# Patient Record
Sex: Male | Born: 1945
Health system: Southern US, Community
[De-identification: ages and names within clinical notes are randomized; demographics above are authoritative.]

## PROBLEM LIST (undated history)

## (undated) DIAGNOSIS — B029 Zoster without complications: Secondary | ICD-10-CM

## (undated) DIAGNOSIS — H9319 Tinnitus, unspecified ear: Secondary | ICD-10-CM

## (undated) DIAGNOSIS — R079 Chest pain, unspecified: Secondary | ICD-10-CM

## (undated) DIAGNOSIS — I209 Angina pectoris, unspecified: Secondary | ICD-10-CM

## (undated) DIAGNOSIS — H269 Unspecified cataract: Secondary | ICD-10-CM

## (undated) DIAGNOSIS — I10 Essential (primary) hypertension: Secondary | ICD-10-CM

## (undated) DIAGNOSIS — E785 Hyperlipidemia, unspecified: Secondary | ICD-10-CM

## (undated) HISTORY — DX: Essential (primary) hypertension: I10

## (undated) HISTORY — PX: COLONOSCOPY: SHX174

## (undated) HISTORY — DX: Tinnitus, unspecified ear: H93.19

## (undated) HISTORY — DX: Unspecified cataract: H26.9

## (undated) HISTORY — DX: Hyperlipidemia, unspecified: E78.5

## (undated) HISTORY — DX: Angina pectoris, unspecified: I20.9

## (undated) HISTORY — PX: ROTATOR CUFF REPAIR: SHX139

## (undated) HISTORY — DX: Chest pain, unspecified: R07.9

## (undated) HISTORY — DX: Zoster without complications: B02.9

## (undated) HISTORY — PX: OTHER SURGICAL HISTORY: SHX169

---

## 2010-07-03 DIAGNOSIS — B029 Zoster without complications: Secondary | ICD-10-CM

## 2010-07-03 HISTORY — DX: Zoster without complications: B02.9

## 2012-03-21 ENCOUNTER — Other Ambulatory Visit: Payer: Self-pay | Admitting: Family Medicine

## 2012-03-21 DIAGNOSIS — R52 Pain, unspecified: Secondary | ICD-10-CM

## 2012-03-29 ENCOUNTER — Other Ambulatory Visit: Payer: Self-pay

## 2013-02-17 DIAGNOSIS — Z125 Encounter for screening for malignant neoplasm of prostate: Secondary | ICD-10-CM | POA: Diagnosis not present

## 2013-02-17 DIAGNOSIS — I10 Essential (primary) hypertension: Secondary | ICD-10-CM | POA: Diagnosis not present

## 2013-02-24 ENCOUNTER — Other Ambulatory Visit: Payer: Self-pay | Admitting: Internal Medicine

## 2013-02-24 DIAGNOSIS — F17200 Nicotine dependence, unspecified, uncomplicated: Secondary | ICD-10-CM

## 2013-02-24 DIAGNOSIS — E785 Hyperlipidemia, unspecified: Secondary | ICD-10-CM | POA: Diagnosis not present

## 2013-02-24 DIAGNOSIS — B356 Tinea cruris: Secondary | ICD-10-CM | POA: Diagnosis not present

## 2013-02-24 DIAGNOSIS — Z23 Encounter for immunization: Secondary | ICD-10-CM | POA: Diagnosis not present

## 2013-02-24 DIAGNOSIS — Z0000 Encounter for general adult medical examination without abnormal findings: Secondary | ICD-10-CM | POA: Diagnosis not present

## 2013-02-24 DIAGNOSIS — I10 Essential (primary) hypertension: Secondary | ICD-10-CM | POA: Diagnosis not present

## 2013-02-24 DIAGNOSIS — Z1331 Encounter for screening for depression: Secondary | ICD-10-CM | POA: Diagnosis not present

## 2013-02-24 DIAGNOSIS — R42 Dizziness and giddiness: Secondary | ICD-10-CM | POA: Diagnosis not present

## 2013-02-24 DIAGNOSIS — Z125 Encounter for screening for malignant neoplasm of prostate: Secondary | ICD-10-CM | POA: Diagnosis not present

## 2013-02-28 DIAGNOSIS — Z1212 Encounter for screening for malignant neoplasm of rectum: Secondary | ICD-10-CM | POA: Diagnosis not present

## 2013-03-06 ENCOUNTER — Ambulatory Visit
Admission: RE | Admit: 2013-03-06 | Discharge: 2013-03-06 | Disposition: A | Discharge status: Home / Self Care | Payer: Medicare Other | Source: Ambulatory Visit | Attending: Internal Medicine | Admitting: Internal Medicine

## 2013-03-06 DIAGNOSIS — F17200 Nicotine dependence, unspecified, uncomplicated: Secondary | ICD-10-CM

## 2013-03-06 DIAGNOSIS — Z136 Encounter for screening for cardiovascular disorders: Secondary | ICD-10-CM | POA: Diagnosis not present

## 2013-03-06 IMAGING — US US AORTA SCREENING (MEDICARE)
1 series · 14 of 17 positions shown · non-contrast
Comparison: None.

CLINICAL DATA: Abdominal aortic aneurysm.

ABDOMINAL AORTA SCREENING ULTRASOUND
TECHNIQUE: Ultrasound examination of the abdominal aorta was
performed as a screening evaluation for abdominal aortic aneurysm.

[Series 1: us aorta screening (medicare) · 0.30mm/px · 14 of 17 slices shown]
[im 1/17]
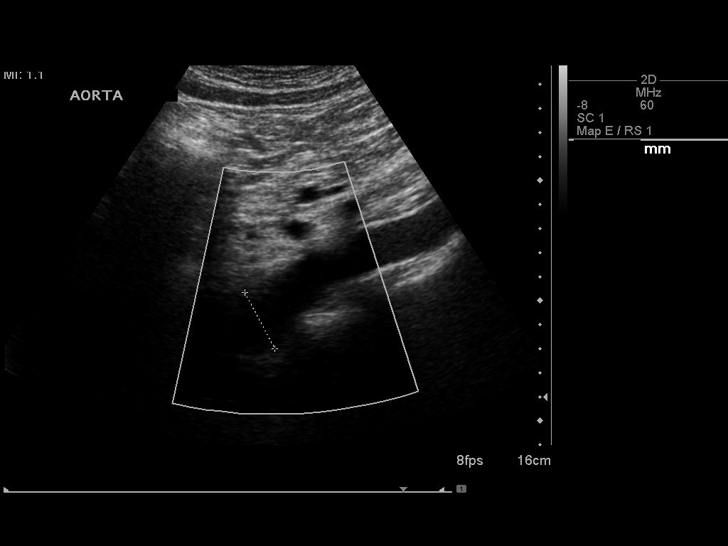
[im 2/17]
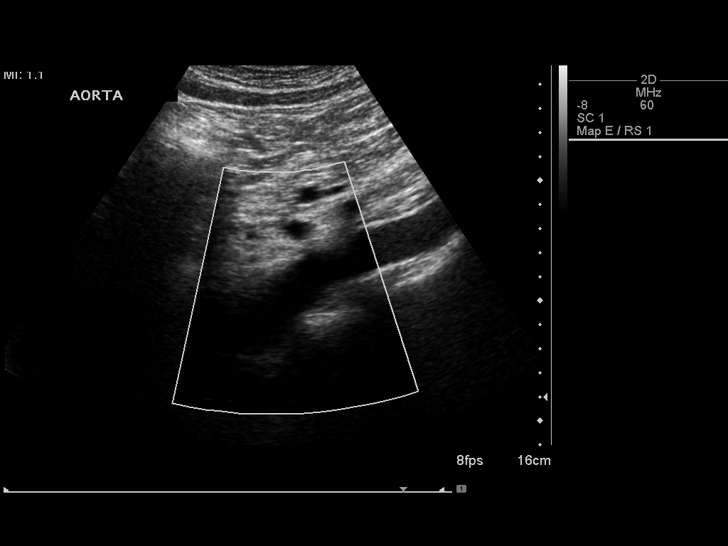
[im 4/17]
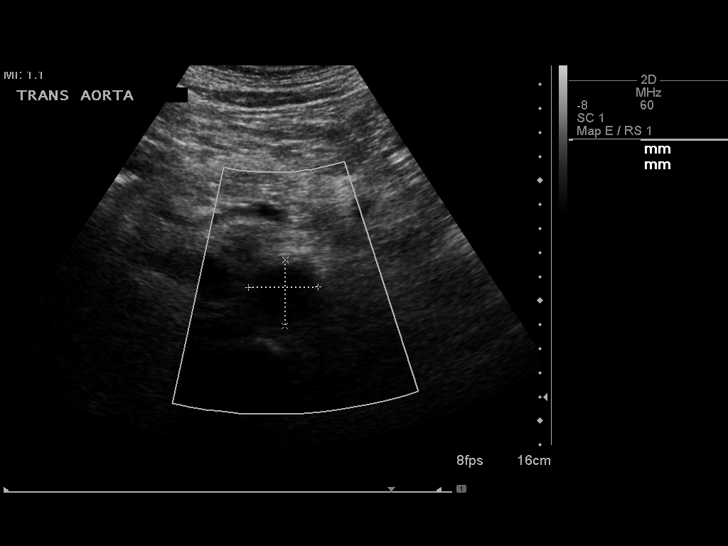
[im 5/17]
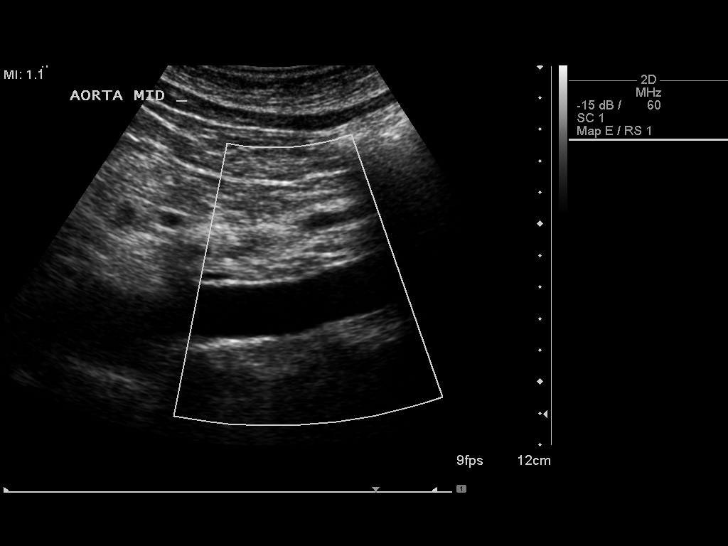
[im 6/17]
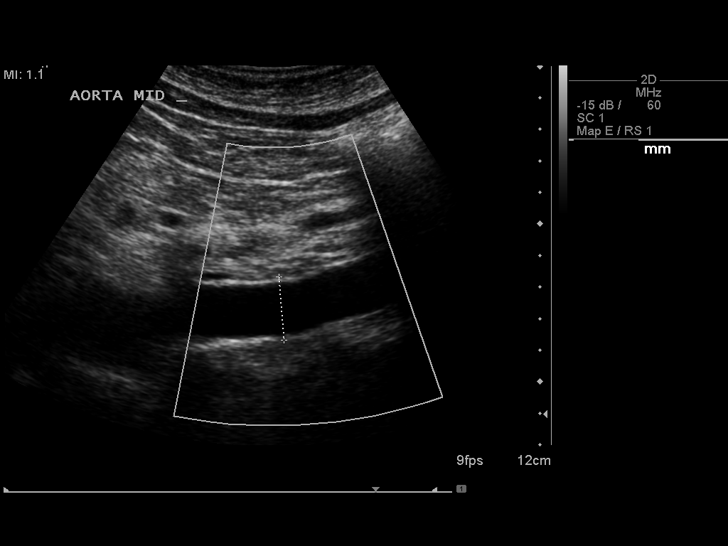
[im 7/17]
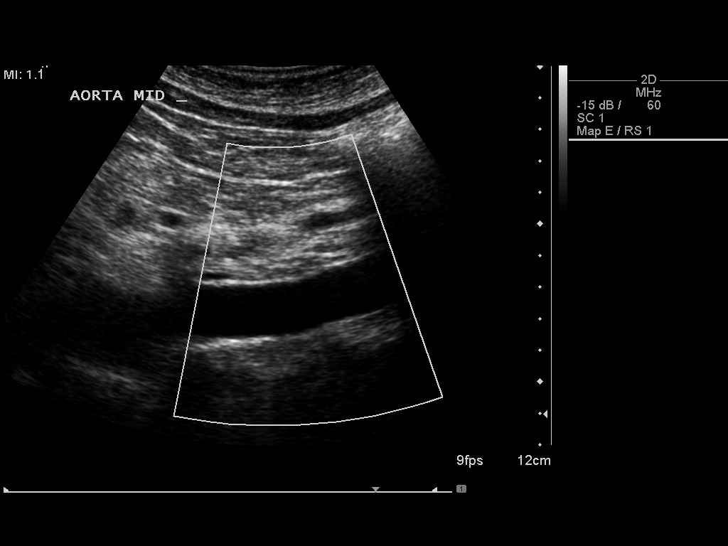
[im 8/17]
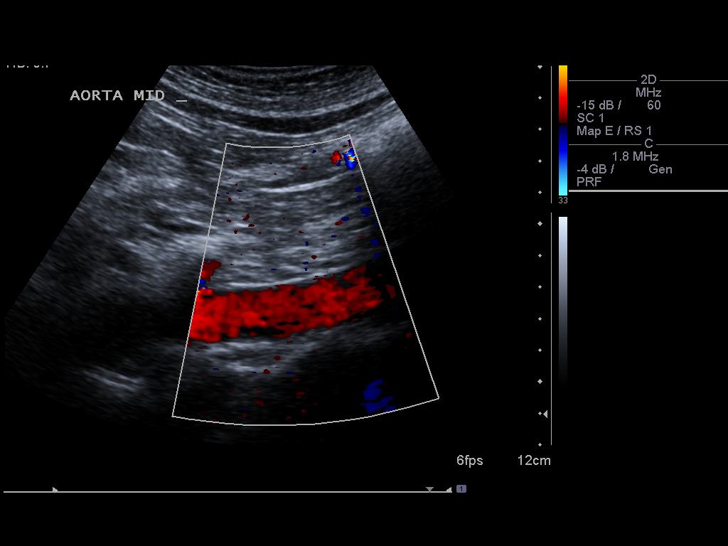
[im 10/17]
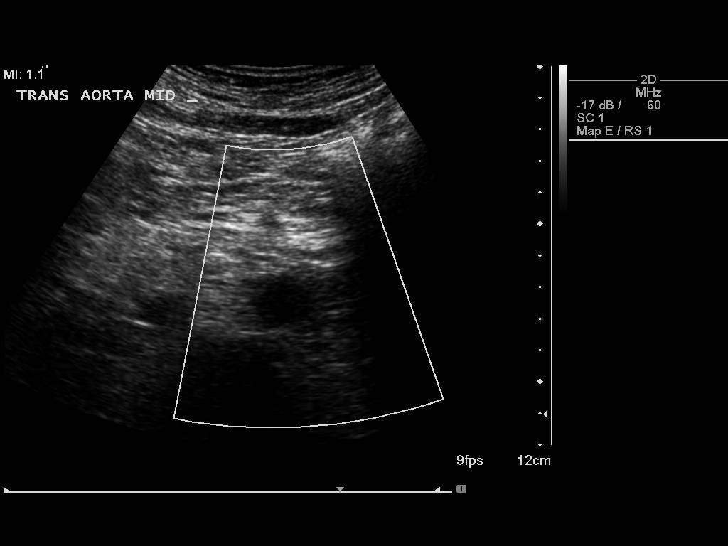
[im 11/17]
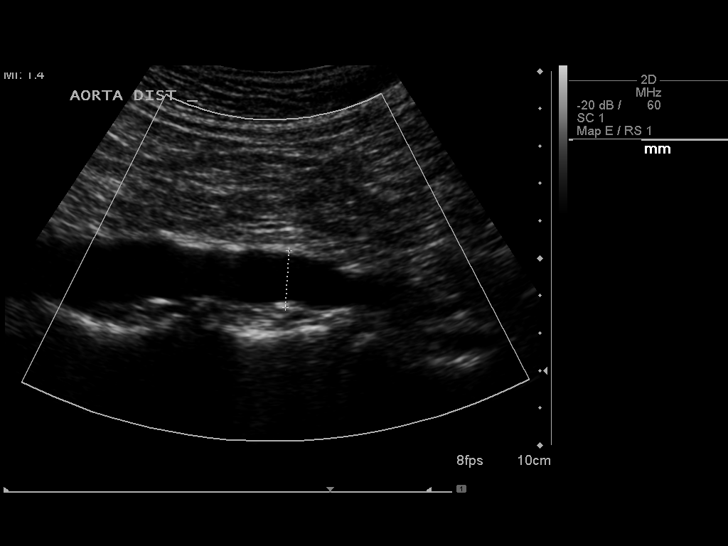
[im 12/17]
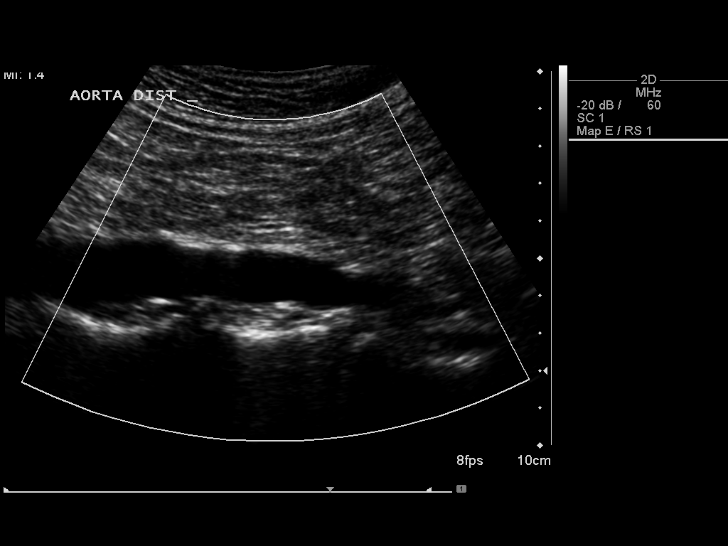
[im 13/17]
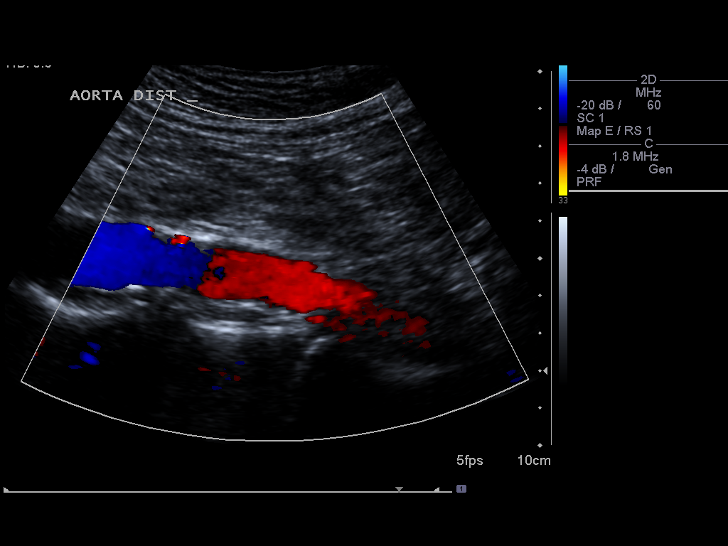
[im 14/17]
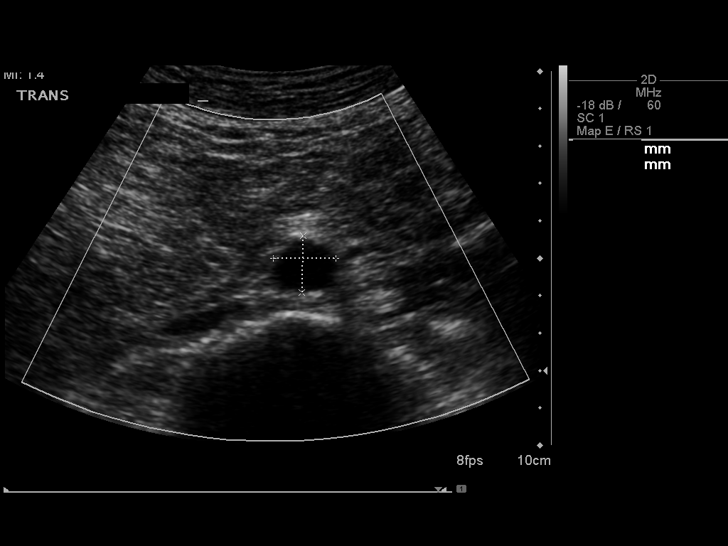
[im 16/17]
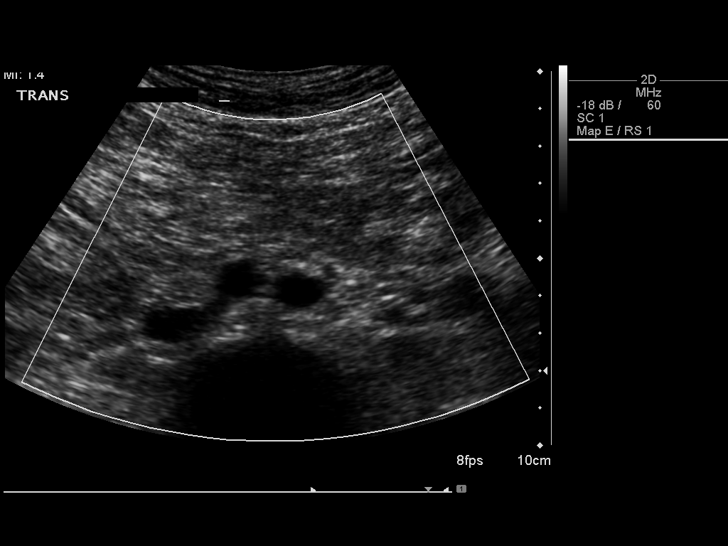
[im 17/17]
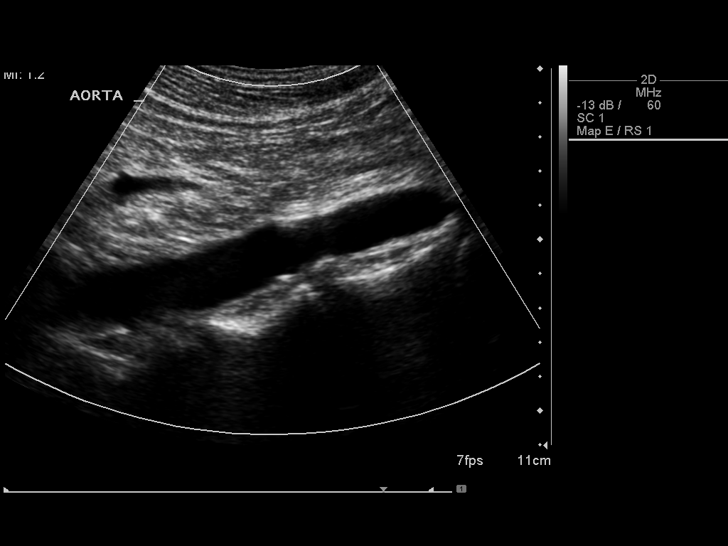

[14 of 17 positions shown; findings below may reference images not displayed]

Abdominal Aorta: No aneurysm identified.

      Maximum AP diameter:  2.7 cm.
      Maximum TRV diameter:  2.9 cm.
IMPRESSION: No abdominal aortic aneurysm.  Mild atherosclerosis.

## 2014-02-25 DIAGNOSIS — E785 Hyperlipidemia, unspecified: Secondary | ICD-10-CM | POA: Diagnosis not present

## 2014-02-25 DIAGNOSIS — I10 Essential (primary) hypertension: Secondary | ICD-10-CM | POA: Diagnosis not present

## 2014-02-25 DIAGNOSIS — Z125 Encounter for screening for malignant neoplasm of prostate: Secondary | ICD-10-CM | POA: Diagnosis not present

## 2014-03-04 DIAGNOSIS — N402 Nodular prostate without lower urinary tract symptoms: Secondary | ICD-10-CM | POA: Diagnosis not present

## 2014-03-04 DIAGNOSIS — A6000 Herpesviral infection of urogenital system, unspecified: Secondary | ICD-10-CM | POA: Diagnosis not present

## 2014-03-04 DIAGNOSIS — R42 Dizziness and giddiness: Secondary | ICD-10-CM | POA: Diagnosis not present

## 2014-03-04 DIAGNOSIS — N529 Male erectile dysfunction, unspecified: Secondary | ICD-10-CM | POA: Diagnosis not present

## 2014-03-04 DIAGNOSIS — E785 Hyperlipidemia, unspecified: Secondary | ICD-10-CM | POA: Diagnosis not present

## 2014-03-04 DIAGNOSIS — Z1331 Encounter for screening for depression: Secondary | ICD-10-CM | POA: Diagnosis not present

## 2014-03-04 DIAGNOSIS — I10 Essential (primary) hypertension: Secondary | ICD-10-CM | POA: Diagnosis not present

## 2014-03-04 DIAGNOSIS — Z0000 Encounter for general adult medical examination without abnormal findings: Secondary | ICD-10-CM | POA: Diagnosis not present

## 2014-03-04 DIAGNOSIS — Z1212 Encounter for screening for malignant neoplasm of rectum: Secondary | ICD-10-CM | POA: Diagnosis not present

## 2014-04-29 DIAGNOSIS — N402 Nodular prostate without lower urinary tract symptoms: Secondary | ICD-10-CM | POA: Diagnosis not present

## 2014-12-18 DIAGNOSIS — N402 Nodular prostate without lower urinary tract symptoms: Secondary | ICD-10-CM | POA: Diagnosis not present

## 2014-12-23 DIAGNOSIS — N402 Nodular prostate without lower urinary tract symptoms: Secondary | ICD-10-CM | POA: Diagnosis not present

## 2015-01-14 DIAGNOSIS — N402 Nodular prostate without lower urinary tract symptoms: Secondary | ICD-10-CM | POA: Diagnosis not present

## 2015-03-09 DIAGNOSIS — Z125 Encounter for screening for malignant neoplasm of prostate: Secondary | ICD-10-CM | POA: Diagnosis not present

## 2015-03-09 DIAGNOSIS — I10 Essential (primary) hypertension: Secondary | ICD-10-CM | POA: Diagnosis not present

## 2015-03-09 DIAGNOSIS — E785 Hyperlipidemia, unspecified: Secondary | ICD-10-CM | POA: Diagnosis not present

## 2015-03-15 DIAGNOSIS — Z0000 Encounter for general adult medical examination without abnormal findings: Secondary | ICD-10-CM | POA: Diagnosis not present

## 2015-03-15 DIAGNOSIS — Z6823 Body mass index (BMI) 23.0-23.9, adult: Secondary | ICD-10-CM | POA: Diagnosis not present

## 2015-03-15 DIAGNOSIS — Z1389 Encounter for screening for other disorder: Secondary | ICD-10-CM | POA: Diagnosis not present

## 2015-03-15 DIAGNOSIS — E785 Hyperlipidemia, unspecified: Secondary | ICD-10-CM | POA: Diagnosis not present

## 2015-03-15 DIAGNOSIS — Z23 Encounter for immunization: Secondary | ICD-10-CM | POA: Diagnosis not present

## 2015-03-15 DIAGNOSIS — R42 Dizziness and giddiness: Secondary | ICD-10-CM | POA: Diagnosis not present

## 2015-03-15 DIAGNOSIS — N402 Nodular prostate without lower urinary tract symptoms: Secondary | ICD-10-CM | POA: Diagnosis not present

## 2015-03-15 DIAGNOSIS — K5909 Other constipation: Secondary | ICD-10-CM | POA: Diagnosis not present

## 2015-03-15 DIAGNOSIS — M25551 Pain in right hip: Secondary | ICD-10-CM | POA: Diagnosis not present

## 2015-03-15 DIAGNOSIS — R7301 Impaired fasting glucose: Secondary | ICD-10-CM | POA: Diagnosis not present

## 2015-03-15 DIAGNOSIS — A6000 Herpesviral infection of urogenital system, unspecified: Secondary | ICD-10-CM | POA: Diagnosis not present

## 2015-03-15 DIAGNOSIS — I10 Essential (primary) hypertension: Secondary | ICD-10-CM | POA: Diagnosis not present

## 2015-03-15 DIAGNOSIS — N529 Male erectile dysfunction, unspecified: Secondary | ICD-10-CM | POA: Diagnosis not present

## 2015-04-26 DIAGNOSIS — Z1212 Encounter for screening for malignant neoplasm of rectum: Secondary | ICD-10-CM | POA: Diagnosis not present

## 2015-07-14 DIAGNOSIS — N402 Nodular prostate without lower urinary tract symptoms: Secondary | ICD-10-CM | POA: Diagnosis not present

## 2015-07-21 DIAGNOSIS — N402 Nodular prostate without lower urinary tract symptoms: Secondary | ICD-10-CM | POA: Diagnosis not present

## 2015-07-21 DIAGNOSIS — Z0000 Encounter for general adult medical examination without abnormal findings: Secondary | ICD-10-CM | POA: Diagnosis not present

## 2015-09-13 DIAGNOSIS — M25551 Pain in right hip: Secondary | ICD-10-CM | POA: Diagnosis not present

## 2015-09-13 DIAGNOSIS — Z6823 Body mass index (BMI) 23.0-23.9, adult: Secondary | ICD-10-CM | POA: Diagnosis not present

## 2015-09-24 DIAGNOSIS — M25551 Pain in right hip: Secondary | ICD-10-CM | POA: Diagnosis not present

## 2015-11-08 DIAGNOSIS — M25551 Pain in right hip: Secondary | ICD-10-CM | POA: Diagnosis not present

## 2016-01-26 DIAGNOSIS — N402 Nodular prostate without lower urinary tract symptoms: Secondary | ICD-10-CM | POA: Diagnosis not present

## 2016-02-02 DIAGNOSIS — N402 Nodular prostate without lower urinary tract symptoms: Secondary | ICD-10-CM | POA: Diagnosis not present

## 2016-03-15 DIAGNOSIS — Z0000 Encounter for general adult medical examination without abnormal findings: Secondary | ICD-10-CM | POA: Diagnosis not present

## 2016-03-15 DIAGNOSIS — R7301 Impaired fasting glucose: Secondary | ICD-10-CM | POA: Diagnosis not present

## 2016-03-15 DIAGNOSIS — Z125 Encounter for screening for malignant neoplasm of prostate: Secondary | ICD-10-CM | POA: Diagnosis not present

## 2016-03-15 DIAGNOSIS — I10 Essential (primary) hypertension: Secondary | ICD-10-CM | POA: Diagnosis not present

## 2016-03-15 DIAGNOSIS — E784 Other hyperlipidemia: Secondary | ICD-10-CM | POA: Diagnosis not present

## 2016-03-22 DIAGNOSIS — A6000 Herpesviral infection of urogenital system, unspecified: Secondary | ICD-10-CM | POA: Diagnosis not present

## 2016-03-22 DIAGNOSIS — R42 Dizziness and giddiness: Secondary | ICD-10-CM | POA: Diagnosis not present

## 2016-03-22 DIAGNOSIS — Z1389 Encounter for screening for other disorder: Secondary | ICD-10-CM | POA: Diagnosis not present

## 2016-03-22 DIAGNOSIS — I10 Essential (primary) hypertension: Secondary | ICD-10-CM | POA: Diagnosis not present

## 2016-03-22 DIAGNOSIS — N528 Other male erectile dysfunction: Secondary | ICD-10-CM | POA: Diagnosis not present

## 2016-03-22 DIAGNOSIS — K5909 Other constipation: Secondary | ICD-10-CM | POA: Diagnosis not present

## 2016-03-22 DIAGNOSIS — R7301 Impaired fasting glucose: Secondary | ICD-10-CM | POA: Diagnosis not present

## 2016-03-22 DIAGNOSIS — Z6823 Body mass index (BMI) 23.0-23.9, adult: Secondary | ICD-10-CM | POA: Diagnosis not present

## 2016-03-22 DIAGNOSIS — N402 Nodular prostate without lower urinary tract symptoms: Secondary | ICD-10-CM | POA: Diagnosis not present

## 2016-03-22 DIAGNOSIS — E784 Other hyperlipidemia: Secondary | ICD-10-CM | POA: Diagnosis not present

## 2016-03-22 DIAGNOSIS — Z0000 Encounter for general adult medical examination without abnormal findings: Secondary | ICD-10-CM | POA: Diagnosis not present

## 2016-03-27 DIAGNOSIS — Z1212 Encounter for screening for malignant neoplasm of rectum: Secondary | ICD-10-CM | POA: Diagnosis not present

## 2016-07-13 DIAGNOSIS — H2513 Age-related nuclear cataract, bilateral: Secondary | ICD-10-CM | POA: Diagnosis not present

## 2016-07-13 DIAGNOSIS — H52203 Unspecified astigmatism, bilateral: Secondary | ICD-10-CM | POA: Diagnosis not present

## 2016-07-13 DIAGNOSIS — H5203 Hypermetropia, bilateral: Secondary | ICD-10-CM | POA: Diagnosis not present

## 2016-09-20 DIAGNOSIS — Z6824 Body mass index (BMI) 24.0-24.9, adult: Secondary | ICD-10-CM | POA: Diagnosis not present

## 2016-09-20 DIAGNOSIS — C4491 Basal cell carcinoma of skin, unspecified: Secondary | ICD-10-CM | POA: Diagnosis not present

## 2016-10-05 DIAGNOSIS — L603 Nail dystrophy: Secondary | ICD-10-CM | POA: Diagnosis not present

## 2016-10-05 DIAGNOSIS — L918 Other hypertrophic disorders of the skin: Secondary | ICD-10-CM | POA: Diagnosis not present

## 2016-10-05 DIAGNOSIS — L570 Actinic keratosis: Secondary | ICD-10-CM | POA: Diagnosis not present

## 2016-10-05 DIAGNOSIS — D2272 Melanocytic nevi of left lower limb, including hip: Secondary | ICD-10-CM | POA: Diagnosis not present

## 2016-10-05 DIAGNOSIS — L821 Other seborrheic keratosis: Secondary | ICD-10-CM | POA: Diagnosis not present

## 2016-10-05 DIAGNOSIS — D2271 Melanocytic nevi of right lower limb, including hip: Secondary | ICD-10-CM | POA: Diagnosis not present

## 2016-10-05 DIAGNOSIS — D2361 Other benign neoplasm of skin of right upper limb, including shoulder: Secondary | ICD-10-CM | POA: Diagnosis not present

## 2016-10-05 DIAGNOSIS — B351 Tinea unguium: Secondary | ICD-10-CM | POA: Diagnosis not present

## 2016-10-05 DIAGNOSIS — B353 Tinea pedis: Secondary | ICD-10-CM | POA: Diagnosis not present

## 2016-10-05 DIAGNOSIS — D485 Neoplasm of uncertain behavior of skin: Secondary | ICD-10-CM | POA: Diagnosis not present

## 2016-10-05 DIAGNOSIS — D2262 Melanocytic nevi of left upper limb, including shoulder: Secondary | ICD-10-CM | POA: Diagnosis not present

## 2016-10-05 DIAGNOSIS — Z79899 Other long term (current) drug therapy: Secondary | ICD-10-CM | POA: Diagnosis not present

## 2016-10-05 DIAGNOSIS — C44629 Squamous cell carcinoma of skin of left upper limb, including shoulder: Secondary | ICD-10-CM | POA: Diagnosis not present

## 2016-11-23 DIAGNOSIS — L601 Onycholysis: Secondary | ICD-10-CM | POA: Diagnosis not present

## 2016-11-23 DIAGNOSIS — Z79899 Other long term (current) drug therapy: Secondary | ICD-10-CM | POA: Diagnosis not present

## 2017-01-31 DIAGNOSIS — N402 Nodular prostate without lower urinary tract symptoms: Secondary | ICD-10-CM | POA: Diagnosis not present

## 2017-02-07 DIAGNOSIS — N402 Nodular prostate without lower urinary tract symptoms: Secondary | ICD-10-CM | POA: Diagnosis not present

## 2017-03-07 DIAGNOSIS — Z85828 Personal history of other malignant neoplasm of skin: Secondary | ICD-10-CM | POA: Diagnosis not present

## 2017-03-07 DIAGNOSIS — B351 Tinea unguium: Secondary | ICD-10-CM | POA: Diagnosis not present

## 2017-03-20 DIAGNOSIS — I10 Essential (primary) hypertension: Secondary | ICD-10-CM | POA: Diagnosis not present

## 2017-03-20 DIAGNOSIS — Z125 Encounter for screening for malignant neoplasm of prostate: Secondary | ICD-10-CM | POA: Diagnosis not present

## 2017-03-20 DIAGNOSIS — R7301 Impaired fasting glucose: Secondary | ICD-10-CM | POA: Diagnosis not present

## 2017-03-20 DIAGNOSIS — E784 Other hyperlipidemia: Secondary | ICD-10-CM | POA: Diagnosis not present

## 2017-03-27 DIAGNOSIS — R7301 Impaired fasting glucose: Secondary | ICD-10-CM | POA: Diagnosis not present

## 2017-03-27 DIAGNOSIS — Z0000 Encounter for general adult medical examination without abnormal findings: Secondary | ICD-10-CM | POA: Diagnosis not present

## 2017-03-27 DIAGNOSIS — Z6824 Body mass index (BMI) 24.0-24.9, adult: Secondary | ICD-10-CM | POA: Diagnosis not present

## 2017-03-27 DIAGNOSIS — R251 Tremor, unspecified: Secondary | ICD-10-CM | POA: Diagnosis not present

## 2017-03-27 DIAGNOSIS — Z859 Personal history of malignant neoplasm, unspecified: Secondary | ICD-10-CM | POA: Diagnosis not present

## 2017-03-27 DIAGNOSIS — E784 Other hyperlipidemia: Secondary | ICD-10-CM | POA: Diagnosis not present

## 2017-03-27 DIAGNOSIS — R269 Unspecified abnormalities of gait and mobility: Secondary | ICD-10-CM | POA: Diagnosis not present

## 2017-03-27 DIAGNOSIS — Z1389 Encounter for screening for other disorder: Secondary | ICD-10-CM | POA: Diagnosis not present

## 2017-03-27 DIAGNOSIS — N402 Nodular prostate without lower urinary tract symptoms: Secondary | ICD-10-CM | POA: Diagnosis not present

## 2017-03-27 DIAGNOSIS — R42 Dizziness and giddiness: Secondary | ICD-10-CM | POA: Diagnosis not present

## 2017-03-27 DIAGNOSIS — I10 Essential (primary) hypertension: Secondary | ICD-10-CM | POA: Diagnosis not present

## 2017-03-28 ENCOUNTER — Encounter: Payer: Self-pay | Admitting: Neurology

## 2017-04-04 DIAGNOSIS — Z1212 Encounter for screening for malignant neoplasm of rectum: Secondary | ICD-10-CM | POA: Diagnosis not present

## 2017-04-25 ENCOUNTER — Encounter: Payer: Self-pay | Admitting: Neurology

## 2017-04-26 NOTE — Progress Notes (Signed)
Jeffery Kirby was seen today in the movement disorders clinic for neurologic consultation at the request of Marton Redwood, MD.  The consultation is for the evaluation of tremor and to r/o PD.  Pt states that he hasn't really even noticed tremor but wife mentioned it after Dr. Brigitte Pulse said something.  Specific Symptoms:  Tremor: Yes.  , he isn't sure but thinks it happens when grabbing something.   Family hx of similar:  No. Voice: no change.   Sleep: sleeps well  Vivid Dreams:  Yes.    Acting out dreams:  Yes.  , wife states that he screams and can "throw punches" Wet Pillows: No. Postural symptoms:  No. - very minimal balance change  Falls?  No. Bradykinesia symptoms: may push off to get out of a chair Loss of smell:  Yes.   Loss of taste:  No. Urinary Incontinence:  No. but has nocturia Difficulty Swallowing:  No.  Handwriting, micrographia: No. Trouble with ADL's:  No.  Trouble buttoning clothing: No. (minimal trouble with "tight buttons") Depression:  No. Memory changes:  No. Hallucinations:  No.  visual distortions: Yes.  , but only because has frequent floaters N/V:  No. Lightheaded:  Yes.    Syncope: No. Diplopia:  No. Dyskinesia:  No.  Neuroimaging of the brain has not previously been performed.    PREVIOUS MEDICATIONS: none to date  ALLERGIES:  No Known Allergies  CURRENT MEDICATIONS:  Outpatient Encounter Prescriptions as of 04/30/2017  Medication Sig  . aspirin EC 81 MG tablet Take 81 mg by mouth daily.  . naproxen (NAPROSYN) 500 MG tablet Take 500 mg by mouth 2 (two) times daily with a meal.  . simvastatin (ZOCOR) 20 MG tablet Take 20 mg by mouth daily.  . [DISCONTINUED] sildenafil (VIAGRA) 100 MG tablet Take 100 mg by mouth daily as needed for erectile dysfunction.   No facility-administered encounter medications on file as of 04/30/2017.     PAST MEDICAL HISTORY:   Past Medical History:  Diagnosis Date  . Diverticulosis   . Hyperlipidemia   .  Shingles 2012  . Tinnitus     PAST SURGICAL HISTORY:   Past Surgical History:  Procedure Laterality Date  . ROTATOR CUFF REPAIR Bilateral     SOCIAL HISTORY:   Social History   Social History  . Marital status: Married    Spouse name: N/A  . Number of children: N/A  . Years of education: N/A   Occupational History  . 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 Smoking status: Former Smoker    Start date: 07/03/1965    Quit date: 07/04/1967  . Smokeless tobacco: Never Used  . Alcohol use Yes     Comment: occasional beer  . Drug use: No  . Sexual activity: Not on file   Other Topics Concern  . Not on file   Social History Narrative  . No narrative on file    FAMILY HISTORY:   Family Status  Relation Status  . Mother Deceased  . Father Deceased  . Sister Deceased  . Brother Alive  . Sister Deceased    ROS: Complains of ringing in the ears, only noticeable when the room is quiet.  A complete 10 system review of systems was obtained and was unremarkable apart from what is mentioned above.  PHYSICAL EXAMINATION:    VITALS:   Vitals:   04/30/17 1006  BP: 110/70  Pulse:  72  SpO2: 97%  Weight: 184 lb (83.5 kg)  Height: 6\' 2"  (1.88 m)    GEN:  The patient appears stated age and is in NAD. HEENT:  Normocephalic, atraumatic.  The mucous membranes are moist. The superficial temporal arteries are without ropiness or tenderness. CV:  RRR Lungs:  CTAB Neck/HEME:  There are no carotid bruits bilaterally.  Neurological examination:  Orientation: The patient is alert and oriented x3. Fund of knowledge is appropriate.  Recent and remote memory are intact.  Attention and concentration are normal.    Able to name objects and repeat phrases. Cranial nerves: There is good facial symmetry. There is no facial hypomimia.  Pupils are equal round and reactive to light bilaterally. Fundoscopic exam reveals clear margins bilaterally.  Extraocular muscles are intact. The visual fields are full to confrontational testing. The speech is fluent and clear. Soft palate rises symmetrically and there is no tongue deviation. Hearing is intact to conversational tone. Sensation: Sensation is intact to light and pinprick throughout (facial, trunk, extremities). Vibration is intact at the bilateral big toe. There is no extinction with double simultaneous stimulation. There is no sensory dermatomal level identified. Motor: Strength is 5/5 in the bilateral upper and lower extremities.   Shoulder shrug is equal and symmetric.  There is no pronator drift. Deep tendon reflexes: Deep tendon reflexes are 2/4 at the bilateral biceps, triceps, brachioradialis, patella and achilles. Plantar responses are downgoing bilaterally.  Movement examination: Tone: There is normal tone in the bilateral upper extremities.  The tone in the lower extremities is normal.  Abnormal movements: no rest tremor even with distraction.  There is mild postural tremor bilaterally.  This doesn't increase with intention.  he has no difficulty when asked to pour a full glass of water from one glass to another.   Coordination:  There is no decremation with RAM's, with any form of RAMS, including alternating supination and pronation of the forearm, hand opening and closing, finger taps, heel taps and toe taps. Gait and Station: The patient has no difficulty arising out of a deep-seated chair without the use of the hands. The patient's stride length is normal.  He is able to walk in a tandem fashion and stand in the romberg position.  The patient is able to heel/toe walk   Labs:  I have reviewed lab work from his primary care physician.  Lab work was dated March 20, 2017.  Sodium was 140, potassium 4.8, chloride 106, CO2 25, BUN 13, creatinine 1.0, glucose 99, TSH 1.60, hemoglobin A1c 5.3, AST 14, ALT 17, alkaline phosphatase 58  ASSESSMENT/PLAN:  1.  Tremor, likely essential  tremor  - We discussed nature and pathophysiology.  We discussed that this can continue to gradually get worse with time.  We discussed that some medications can worsen this, as can caffeine use.  We discussed medication therapy as well as surgical therapy.  Ultimately, the patient decided to hold off of any thing.  I only saw minor tremor today.  More than that, I provided reassurance that I saw no evidence of a neuro degenerative process such as Parkinson's disease.  I did tell him that should he experience worsening of tremor or new neurologic symptoms such as falls or balance change, he should call me and make a new appointment for evaluation.    2.  Tinnitus  -likely due to [place].  Told him that there is really nothing from a neurologic standpoint that will help this.  3.  I will see him back on an as-needed basis.  Much greater than 50% of this visit was spent in counseling and coordinating care.  Total face to face time:  45 min   Cc:  Marton Redwood, MD

## 2017-04-30 ENCOUNTER — Ambulatory Visit (INDEPENDENT_AMBULATORY_CARE_PROVIDER_SITE_OTHER): Payer: Medicare Other | Admitting: Neurology

## 2017-04-30 ENCOUNTER — Encounter: Payer: Self-pay | Admitting: Neurology

## 2017-04-30 VITALS — BP 110/70 | HR 72 | Ht 74.0 in | Wt 184.0 lb

## 2017-04-30 DIAGNOSIS — G250 Essential tremor: Secondary | ICD-10-CM

## 2017-07-16 DIAGNOSIS — H2513 Age-related nuclear cataract, bilateral: Secondary | ICD-10-CM | POA: Diagnosis not present

## 2017-07-16 DIAGNOSIS — H5203 Hypermetropia, bilateral: Secondary | ICD-10-CM | POA: Diagnosis not present

## 2017-09-03 DIAGNOSIS — Z6824 Body mass index (BMI) 24.0-24.9, adult: Secondary | ICD-10-CM | POA: Diagnosis not present

## 2017-09-03 DIAGNOSIS — M545 Low back pain: Secondary | ICD-10-CM | POA: Diagnosis not present

## 2018-02-06 DIAGNOSIS — N402 Nodular prostate without lower urinary tract symptoms: Secondary | ICD-10-CM | POA: Diagnosis not present

## 2018-02-13 DIAGNOSIS — N402 Nodular prostate without lower urinary tract symptoms: Secondary | ICD-10-CM | POA: Diagnosis not present

## 2018-04-12 DIAGNOSIS — D2262 Melanocytic nevi of left upper limb, including shoulder: Secondary | ICD-10-CM | POA: Diagnosis not present

## 2018-04-12 DIAGNOSIS — L723 Sebaceous cyst: Secondary | ICD-10-CM | POA: Diagnosis not present

## 2018-04-12 DIAGNOSIS — D2361 Other benign neoplasm of skin of right upper limb, including shoulder: Secondary | ICD-10-CM | POA: Diagnosis not present

## 2018-04-12 DIAGNOSIS — D2271 Melanocytic nevi of right lower limb, including hip: Secondary | ICD-10-CM | POA: Diagnosis not present

## 2018-04-12 DIAGNOSIS — L821 Other seborrheic keratosis: Secondary | ICD-10-CM | POA: Diagnosis not present

## 2018-04-12 DIAGNOSIS — D2261 Melanocytic nevi of right upper limb, including shoulder: Secondary | ICD-10-CM | POA: Diagnosis not present

## 2018-04-12 DIAGNOSIS — Z85828 Personal history of other malignant neoplasm of skin: Secondary | ICD-10-CM | POA: Diagnosis not present

## 2018-04-12 DIAGNOSIS — D2371 Other benign neoplasm of skin of right lower limb, including hip: Secondary | ICD-10-CM | POA: Diagnosis not present

## 2018-04-12 DIAGNOSIS — D2272 Melanocytic nevi of left lower limb, including hip: Secondary | ICD-10-CM | POA: Diagnosis not present

## 2018-04-12 DIAGNOSIS — D225 Melanocytic nevi of trunk: Secondary | ICD-10-CM | POA: Diagnosis not present

## 2018-04-12 DIAGNOSIS — D2372 Other benign neoplasm of skin of left lower limb, including hip: Secondary | ICD-10-CM | POA: Diagnosis not present

## 2018-04-12 DIAGNOSIS — L570 Actinic keratosis: Secondary | ICD-10-CM | POA: Diagnosis not present

## 2018-04-30 DIAGNOSIS — E7849 Other hyperlipidemia: Secondary | ICD-10-CM | POA: Diagnosis not present

## 2018-04-30 DIAGNOSIS — Z125 Encounter for screening for malignant neoplasm of prostate: Secondary | ICD-10-CM | POA: Diagnosis not present

## 2018-04-30 DIAGNOSIS — I10 Essential (primary) hypertension: Secondary | ICD-10-CM | POA: Diagnosis not present

## 2018-04-30 DIAGNOSIS — R82998 Other abnormal findings in urine: Secondary | ICD-10-CM | POA: Diagnosis not present

## 2018-04-30 DIAGNOSIS — R7301 Impaired fasting glucose: Secondary | ICD-10-CM | POA: Diagnosis not present

## 2018-05-07 DIAGNOSIS — G3184 Mild cognitive impairment, so stated: Secondary | ICD-10-CM | POA: Diagnosis not present

## 2018-05-07 DIAGNOSIS — R7301 Impaired fasting glucose: Secondary | ICD-10-CM | POA: Diagnosis not present

## 2018-05-07 DIAGNOSIS — R42 Dizziness and giddiness: Secondary | ICD-10-CM | POA: Diagnosis not present

## 2018-05-07 DIAGNOSIS — E7849 Other hyperlipidemia: Secondary | ICD-10-CM | POA: Diagnosis not present

## 2018-05-07 DIAGNOSIS — Z6824 Body mass index (BMI) 24.0-24.9, adult: Secondary | ICD-10-CM | POA: Diagnosis not present

## 2018-05-07 DIAGNOSIS — Z1389 Encounter for screening for other disorder: Secondary | ICD-10-CM | POA: Diagnosis not present

## 2018-05-07 DIAGNOSIS — R251 Tremor, unspecified: Secondary | ICD-10-CM | POA: Diagnosis not present

## 2018-05-07 DIAGNOSIS — Z0000 Encounter for general adult medical examination without abnormal findings: Secondary | ICD-10-CM | POA: Diagnosis not present

## 2018-05-07 DIAGNOSIS — R32 Unspecified urinary incontinence: Secondary | ICD-10-CM | POA: Diagnosis not present

## 2018-05-07 DIAGNOSIS — I10 Essential (primary) hypertension: Secondary | ICD-10-CM | POA: Diagnosis not present

## 2018-05-07 DIAGNOSIS — R2689 Other abnormalities of gait and mobility: Secondary | ICD-10-CM | POA: Diagnosis not present

## 2018-05-17 DIAGNOSIS — Z1212 Encounter for screening for malignant neoplasm of rectum: Secondary | ICD-10-CM | POA: Diagnosis not present

## 2018-06-04 ENCOUNTER — Encounter: Payer: Self-pay | Admitting: Internal Medicine

## 2018-07-04 ENCOUNTER — Ambulatory Visit (AMBULATORY_SURGERY_CENTER): Payer: Self-pay | Admitting: *Deleted

## 2018-07-04 ENCOUNTER — Other Ambulatory Visit: Payer: Self-pay

## 2018-07-04 VITALS — Ht 74.0 in | Wt 184.4 lb

## 2018-07-04 DIAGNOSIS — Z1211 Encounter for screening for malignant neoplasm of colon: Secondary | ICD-10-CM

## 2018-07-04 MED ORDER — SUPREP BOWEL PREP KIT 17.5-3.13-1.6 GM/177ML PO SOLN: 1.0000 | Freq: Once | ORAL | 0 refills | Status: AC

## 2018-07-04 NOTE — Progress Notes (Signed)
No egg or soy allergy known to patient  No issues with past sedation with any surgeries  or procedures, no intubation problems  No diet pills per patient No home 02 use per patient  No blood thinners per patient  Pt denies issues with constipation  No A fib or A flutter  EMMI video sent to pt's e mail  

## 2018-07-17 DIAGNOSIS — H2513 Age-related nuclear cataract, bilateral: Secondary | ICD-10-CM | POA: Diagnosis not present

## 2018-07-17 DIAGNOSIS — H5203 Hypermetropia, bilateral: Secondary | ICD-10-CM | POA: Diagnosis not present

## 2018-07-19 ENCOUNTER — Encounter: Payer: Self-pay | Admitting: Internal Medicine

## 2018-07-19 ENCOUNTER — Ambulatory Visit (AMBULATORY_SURGERY_CENTER): Payer: Medicare Other | Admitting: Internal Medicine

## 2018-07-19 VITALS — BP 137/74 | HR 69 | Temp 99.1°F | Resp 21 | Ht 74.0 in | Wt 184.0 lb

## 2018-07-19 DIAGNOSIS — Z1211 Encounter for screening for malignant neoplasm of colon: Secondary | ICD-10-CM | POA: Diagnosis not present

## 2018-07-19 DIAGNOSIS — E785 Hyperlipidemia, unspecified: Secondary | ICD-10-CM | POA: Diagnosis not present

## 2018-07-19 DIAGNOSIS — D122 Benign neoplasm of ascending colon: Secondary | ICD-10-CM | POA: Diagnosis not present

## 2018-07-19 MED ORDER — SODIUM CHLORIDE 0.9 % IV SOLN: 500.0000 mL | Freq: Once | INTRAVENOUS | Status: DC

## 2018-07-19 NOTE — Patient Instructions (Signed)
Discharge instructions given. Handouts on polyps and diverticulosis. Resume previous medications. YOU HAD AN ENDOSCOPIC PROCEDURE TODAY AT [facility]:   Refer to the procedure report that was given to you for any specific questions about what was found during the examination.  If the procedure report does not answer your questions, please call your gastroenterologist to clarify.  If you requested that your care partner not be given the details of your procedure findings, then the procedure report has been included in a sealed envelope for you to review at your convenience later.  YOU SHOULD EXPECT: Some feelings of bloating in the abdomen. Passage of more gas than usual.  Walking can help get rid of the air that was put into your GI tract during the procedure and reduce the bloating. If you had a lower endoscopy (such as a colonoscopy or flexible sigmoidoscopy) you may notice spotting of blood in your stool or on the toilet paper. If you underwent a bowel prep for your procedure, you may not have a normal bowel movement for a few days.  Please Note:  You might notice some irritation and congestion in your nose or some drainage.  This is from the oxygen used during your procedure.  There is no need for concern and it should clear up in a day or so.  SYMPTOMS TO REPORT IMMEDIATELY:   Following lower endoscopy (colonoscopy or flexible sigmoidoscopy):  Excessive amounts of blood in the stool  Significant tenderness or worsening of abdominal pains  Swelling of the abdomen that is new, acute  Fever of 100F or higher   Black, tarry-looking stools  For urgent or emergent issues, a gastroenterologist can be reached at any hour by calling [phone].   DIET:  We do recommend a small meal at first, but then you may proceed to your regular diet.  Drink plenty of fluids but you should avoid alcoholic beverages for 24 hours.  ACTIVITY:  You should plan to take it easy for the rest  of today and you should NOT DRIVE or use heavy machinery until tomorrow (because of the sedation medicines used during the test).    FOLLOW UP: Our staff will call the number listed on your records the next business day following your procedure to check on you and address any questions or concerns that you may have regarding the information given to you following your procedure. If we do not reach you, we will leave a message.  However, if you are feeling well and you are not experiencing any problems, there is no need to return our call.  We will assume that you have returned to your regular daily activities without incident.  If any biopsies were taken you will be contacted by phone or by letter within the next 1-3 weeks.  Please call us at [phone] if you have not heard about the biopsies in 3 weeks.    SIGNATURES/CONFIDENTIALITY: You and/or your care partner have signed paperwork which will be entered into your electronic medical record.  These signatures attest to the fact that that the information above on your After Visit Summary has been reviewed and is understood.  Full responsibility of the confidentiality of this discharge information lies with you and/or your care-partner.

## 2018-07-19 NOTE — Op Note (Signed)
[name] Patient Name: Jeffery Kirby Procedure Date: 07/19/2018 8:05 AM MRN: [id number] Endoscopist: Jerene Bears , MD Age: 73 Referring MD:  Date of Birth: 1946-02-02 Gender: Male Account #: [id number] Procedure:                Colonoscopy Indications:              Screening for colorectal malignant neoplasm, Last                            colonoscopy 10 years ago Medicines:                Monitored Anesthesia Care Procedure:                Pre-Anesthesia Assessment:                           - Prior to the procedure, a History and Physical                            was performed, and patient medications and                            allergies were reviewed. The patient's tolerance of                            previous anesthesia was also reviewed. The risks                            and benefits of the procedure and the sedation                            options and risks were discussed with the patient.                            All questions were answered, and informed consent                            was obtained. Prior Anticoagulants: The patient has                            taken no previous anticoagulant or antiplatelet                            agents. ASA Grade Assessment: II - A patient with                            mild systemic disease. After reviewing the risks                            and benefits, the patient was deemed in                            satisfactory condition to undergo the procedure.  After obtaining informed consent, the colonoscope                            was passed under direct vision. Throughout the                            procedure, the patient's blood pressure, pulse, and                            oxygen saturations were monitored continuously. The                            Colonoscope was introduced through the anus and                            advanced to the cecum, identified by  appendiceal                            orifice and ileocecal valve. The colonoscopy was                            performed without difficulty. The patient tolerated                            the procedure well. The quality of the bowel                            preparation was excellent. The ileocecal valve,                            appendiceal orifice, and rectum were photographed. Scope In: 8:14:19 AM Scope Out: 8:29:49 AM Scope Withdrawal Time: 0 hours 9 minutes 54 seconds  Total Procedure Duration: 0 hours 15 minutes 30 seconds  Findings:                 The digital rectal exam was normal.                           A 5 mm polyp was found in the ascending colon. The                            polyp was sessile. The polyp was removed with a                            cold snare. Resection and retrieval were complete.                           Multiple small and large-mouthed diverticula were                            found in the sigmoid colon.                           The exam was otherwise without abnormality on  direct and retroflexion views. Complications:            No immediate complications. Estimated Blood Loss:     Estimated blood loss was minimal. Impression:               - One 5 mm polyp in the ascending colon, removed                            with a cold snare. Resected and retrieved.                           - Diverticulosis in the sigmoid colon.                           - The examination was otherwise normal on direct                            and retroflexion views. Recommendation:           - Patient has a contact number available for                            emergencies. The signs and symptoms of potential                            delayed complications were discussed with the                            patient. Return to normal activities tomorrow.                            Written discharge instructions were provided to the                             patient.                           - Resume previous diet.                           - Continue present medications.                           - Await pathology results.                           - Repeat colonoscopy is recommended. The                            colonoscopy date will be determined after pathology                            results from today's exam become available for                            review. Jerene Bears, MD 07/19/2018 8:31:58 AM This report has been signed electronically.

## 2018-07-19 NOTE — Progress Notes (Signed)
Report to PACU, RN, vss, BBS= Clear.  

## 2018-07-19 NOTE — Progress Notes (Signed)
Called to room to assist during endoscopic procedure.  Patient ID and intended procedure confirmed with present staff. Received instructions for my participation in the procedure from the performing physician.  

## 2018-07-19 NOTE — Progress Notes (Signed)
Pt's states no medical or surgical changes since previsit or office visit. 

## 2018-07-22 ENCOUNTER — Telehealth: Payer: Self-pay | Admitting: *Deleted

## 2018-07-22 ENCOUNTER — Telehealth: Payer: Self-pay

## 2018-07-22 NOTE — Telephone Encounter (Signed)
Left message on f/u call 

## 2018-07-22 NOTE — Telephone Encounter (Signed)
Follow up x 2 left a message.

## 2018-07-31 ENCOUNTER — Encounter: Payer: Self-pay | Admitting: Internal Medicine

## 2019-02-26 DIAGNOSIS — M545 Low back pain: Secondary | ICD-10-CM | POA: Diagnosis not present

## 2019-03-25 DIAGNOSIS — M545 Low back pain: Secondary | ICD-10-CM | POA: Diagnosis not present

## 2019-05-07 DIAGNOSIS — N402 Nodular prostate without lower urinary tract symptoms: Secondary | ICD-10-CM | POA: Diagnosis not present

## 2019-05-08 DIAGNOSIS — E7849 Other hyperlipidemia: Secondary | ICD-10-CM | POA: Diagnosis not present

## 2019-05-08 DIAGNOSIS — R7301 Impaired fasting glucose: Secondary | ICD-10-CM | POA: Diagnosis not present

## 2019-05-12 DIAGNOSIS — Z1212 Encounter for screening for malignant neoplasm of rectum: Secondary | ICD-10-CM | POA: Diagnosis not present

## 2019-05-12 DIAGNOSIS — I10 Essential (primary) hypertension: Secondary | ICD-10-CM | POA: Diagnosis not present

## 2019-05-12 DIAGNOSIS — R82998 Other abnormal findings in urine: Secondary | ICD-10-CM | POA: Diagnosis not present

## 2019-05-14 DIAGNOSIS — Z85828 Personal history of other malignant neoplasm of skin: Secondary | ICD-10-CM | POA: Diagnosis not present

## 2019-05-14 DIAGNOSIS — D2372 Other benign neoplasm of skin of left lower limb, including hip: Secondary | ICD-10-CM | POA: Diagnosis not present

## 2019-05-14 DIAGNOSIS — D2261 Melanocytic nevi of right upper limb, including shoulder: Secondary | ICD-10-CM | POA: Diagnosis not present

## 2019-05-14 DIAGNOSIS — D2272 Melanocytic nevi of left lower limb, including hip: Secondary | ICD-10-CM | POA: Diagnosis not present

## 2019-05-14 DIAGNOSIS — D2361 Other benign neoplasm of skin of right upper limb, including shoulder: Secondary | ICD-10-CM | POA: Diagnosis not present

## 2019-05-14 DIAGNOSIS — D225 Melanocytic nevi of trunk: Secondary | ICD-10-CM | POA: Diagnosis not present

## 2019-05-14 DIAGNOSIS — D2262 Melanocytic nevi of left upper limb, including shoulder: Secondary | ICD-10-CM | POA: Diagnosis not present

## 2019-05-14 DIAGNOSIS — L819 Disorder of pigmentation, unspecified: Secondary | ICD-10-CM | POA: Diagnosis not present

## 2019-05-14 DIAGNOSIS — D2371 Other benign neoplasm of skin of right lower limb, including hip: Secondary | ICD-10-CM | POA: Diagnosis not present

## 2019-05-14 DIAGNOSIS — L821 Other seborrheic keratosis: Secondary | ICD-10-CM | POA: Diagnosis not present

## 2019-05-14 DIAGNOSIS — N402 Nodular prostate without lower urinary tract symptoms: Secondary | ICD-10-CM | POA: Diagnosis not present

## 2019-05-14 DIAGNOSIS — L918 Other hypertrophic disorders of the skin: Secondary | ICD-10-CM | POA: Diagnosis not present

## 2019-05-14 DIAGNOSIS — D2271 Melanocytic nevi of right lower limb, including hip: Secondary | ICD-10-CM | POA: Diagnosis not present

## 2019-05-15 DIAGNOSIS — N402 Nodular prostate without lower urinary tract symptoms: Secondary | ICD-10-CM | POA: Diagnosis not present

## 2019-05-15 DIAGNOSIS — Z1331 Encounter for screening for depression: Secondary | ICD-10-CM | POA: Diagnosis not present

## 2019-05-15 DIAGNOSIS — R269 Unspecified abnormalities of gait and mobility: Secondary | ICD-10-CM | POA: Diagnosis not present

## 2019-05-15 DIAGNOSIS — R251 Tremor, unspecified: Secondary | ICD-10-CM | POA: Diagnosis not present

## 2019-05-15 DIAGNOSIS — G3184 Mild cognitive impairment, so stated: Secondary | ICD-10-CM | POA: Diagnosis not present

## 2019-05-15 DIAGNOSIS — E785 Hyperlipidemia, unspecified: Secondary | ICD-10-CM | POA: Diagnosis not present

## 2019-05-15 DIAGNOSIS — M545 Low back pain: Secondary | ICD-10-CM | POA: Diagnosis not present

## 2019-05-15 DIAGNOSIS — K5792 Diverticulitis of intestine, part unspecified, without perforation or abscess without bleeding: Secondary | ICD-10-CM | POA: Diagnosis not present

## 2019-05-15 DIAGNOSIS — Z0000 Encounter for general adult medical examination without abnormal findings: Secondary | ICD-10-CM | POA: Diagnosis not present

## 2019-05-15 DIAGNOSIS — I10 Essential (primary) hypertension: Secondary | ICD-10-CM | POA: Diagnosis not present

## 2019-05-15 DIAGNOSIS — R7301 Impaired fasting glucose: Secondary | ICD-10-CM | POA: Diagnosis not present

## 2019-06-24 ENCOUNTER — Ambulatory Visit: Payer: Medicare Other | Attending: Internal Medicine

## 2019-06-24 DIAGNOSIS — Z20822 Contact with and (suspected) exposure to covid-19: Secondary | ICD-10-CM

## 2019-06-25 LAB — NOVEL CORONAVIRUS, NAA: SARS-CoV-2, NAA: NOT DETECTED

## 2019-07-23 DIAGNOSIS — H2513 Age-related nuclear cataract, bilateral: Secondary | ICD-10-CM | POA: Diagnosis not present

## 2019-07-23 DIAGNOSIS — H5203 Hypermetropia, bilateral: Secondary | ICD-10-CM | POA: Diagnosis not present

## 2019-07-28 ENCOUNTER — Ambulatory Visit: Payer: Medicare Other | Attending: Internal Medicine

## 2019-07-28 DIAGNOSIS — Z23 Encounter for immunization: Secondary | ICD-10-CM | POA: Insufficient documentation

## 2019-07-28 NOTE — Progress Notes (Signed)
   Covid-19 Vaccination Clinic  Name:  Jeffery Kirby    MRN: [id number] DOB: 20-Jun-1946  07/28/2019  [name] was observed post Covid-19 immunization for 15 minutes without incidence. He was provided with Vaccine Information Sheet and instruction to access the V-Safe system.   [name] was instructed to call 911 with any severe reactions post vaccine: Marland Kitchen Difficulty breathing  . Swelling of your face and throat  . A fast heartbeat  . A bad rash all over your body  . Dizziness and weakness    Immunizations Administered    Name Date Dose VIS Date Route   Pfizer COVID-19 Vaccine 07/28/2019  8:48 AM 0.3 mL 06/13/2019 Intramuscular   Manufacturer: [place]   Lot: [id number]   [place]: [id number]

## 2019-08-12 DIAGNOSIS — H59212 Accidental puncture and laceration of left eye and adnexa during an ophthalmic procedure: Secondary | ICD-10-CM | POA: Diagnosis not present

## 2019-08-12 DIAGNOSIS — H2512 Age-related nuclear cataract, left eye: Secondary | ICD-10-CM | POA: Diagnosis not present

## 2019-08-12 DIAGNOSIS — H25812 Combined forms of age-related cataract, left eye: Secondary | ICD-10-CM | POA: Diagnosis not present

## 2019-08-13 DIAGNOSIS — H59022 Cataract (lens) fragments in eye following cataract surgery, left eye: Secondary | ICD-10-CM | POA: Diagnosis not present

## 2019-08-13 DIAGNOSIS — H43393 Other vitreous opacities, bilateral: Secondary | ICD-10-CM | POA: Diagnosis not present

## 2019-08-13 DIAGNOSIS — H43813 Vitreous degeneration, bilateral: Secondary | ICD-10-CM | POA: Diagnosis not present

## 2019-08-18 ENCOUNTER — Ambulatory Visit: Payer: Medicare Other | Attending: Internal Medicine

## 2019-08-18 DIAGNOSIS — Z23 Encounter for immunization: Secondary | ICD-10-CM

## 2019-08-18 NOTE — Progress Notes (Signed)
   Covid-19 Vaccination Clinic  Name:  Jeffery Kirby    MRN: [id number] DOB: 01-Sep-1945  08/18/2019  Jeffery Kirby was observed post Covid-19 immunization for 15 minutes without incidence. He was provided with Vaccine Information Sheet and instruction to access the V-Safe system.   Jeffery Kirby was instructed to call 911 with any severe reactions post vaccine: Marland Kitchen Difficulty breathing  . Swelling of your face and throat  . A fast heartbeat  . A bad rash all over your body  . Dizziness and weakness    Immunizations Administered    Name Date Dose VIS Date Route   Pfizer COVID-19 Vaccine 08/18/2019  9:25 AM 0.3 mL 06/13/2019 Intramuscular   Manufacturer: [place]   Lot: [id number] [id number]   [place]: [id number]

## 2019-08-20 DIAGNOSIS — H59022 Cataract (lens) fragments in eye following cataract surgery, left eye: Secondary | ICD-10-CM | POA: Diagnosis not present

## 2019-08-22 DIAGNOSIS — H43392 Other vitreous opacities, left eye: Secondary | ICD-10-CM | POA: Diagnosis not present

## 2019-10-15 DIAGNOSIS — Z85828 Personal history of other malignant neoplasm of skin: Secondary | ICD-10-CM | POA: Diagnosis not present

## 2019-10-15 DIAGNOSIS — L570 Actinic keratosis: Secondary | ICD-10-CM | POA: Diagnosis not present

## 2019-10-15 DIAGNOSIS — L821 Other seborrheic keratosis: Secondary | ICD-10-CM | POA: Diagnosis not present

## 2019-11-04 DIAGNOSIS — H2511 Age-related nuclear cataract, right eye: Secondary | ICD-10-CM | POA: Diagnosis not present

## 2019-11-04 DIAGNOSIS — H52201 Unspecified astigmatism, right eye: Secondary | ICD-10-CM | POA: Diagnosis not present

## 2019-11-04 DIAGNOSIS — H25811 Combined forms of age-related cataract, right eye: Secondary | ICD-10-CM | POA: Diagnosis not present

## 2020-01-15 DIAGNOSIS — M9902 Segmental and somatic dysfunction of thoracic region: Secondary | ICD-10-CM | POA: Diagnosis not present

## 2020-01-15 DIAGNOSIS — M50321 Other cervical disc degeneration at C4-C5 level: Secondary | ICD-10-CM | POA: Diagnosis not present

## 2020-01-15 DIAGNOSIS — M9901 Segmental and somatic dysfunction of cervical region: Secondary | ICD-10-CM | POA: Diagnosis not present

## 2020-01-15 DIAGNOSIS — M5125 Other intervertebral disc displacement, thoracolumbar region: Secondary | ICD-10-CM | POA: Diagnosis not present

## 2020-01-20 DIAGNOSIS — M9902 Segmental and somatic dysfunction of thoracic region: Secondary | ICD-10-CM | POA: Diagnosis not present

## 2020-01-20 DIAGNOSIS — M5125 Other intervertebral disc displacement, thoracolumbar region: Secondary | ICD-10-CM | POA: Diagnosis not present

## 2020-01-20 DIAGNOSIS — M9901 Segmental and somatic dysfunction of cervical region: Secondary | ICD-10-CM | POA: Diagnosis not present

## 2020-01-20 DIAGNOSIS — M50321 Other cervical disc degeneration at C4-C5 level: Secondary | ICD-10-CM | POA: Diagnosis not present

## 2020-01-22 DIAGNOSIS — M9902 Segmental and somatic dysfunction of thoracic region: Secondary | ICD-10-CM | POA: Diagnosis not present

## 2020-01-22 DIAGNOSIS — M50321 Other cervical disc degeneration at C4-C5 level: Secondary | ICD-10-CM | POA: Diagnosis not present

## 2020-01-22 DIAGNOSIS — M5125 Other intervertebral disc displacement, thoracolumbar region: Secondary | ICD-10-CM | POA: Diagnosis not present

## 2020-01-22 DIAGNOSIS — M9901 Segmental and somatic dysfunction of cervical region: Secondary | ICD-10-CM | POA: Diagnosis not present

## 2020-02-03 DIAGNOSIS — M9902 Segmental and somatic dysfunction of thoracic region: Secondary | ICD-10-CM | POA: Diagnosis not present

## 2020-02-03 DIAGNOSIS — M9901 Segmental and somatic dysfunction of cervical region: Secondary | ICD-10-CM | POA: Diagnosis not present

## 2020-02-03 DIAGNOSIS — M50321 Other cervical disc degeneration at C4-C5 level: Secondary | ICD-10-CM | POA: Diagnosis not present

## 2020-02-03 DIAGNOSIS — M5125 Other intervertebral disc displacement, thoracolumbar region: Secondary | ICD-10-CM | POA: Diagnosis not present

## 2020-02-10 DIAGNOSIS — M9901 Segmental and somatic dysfunction of cervical region: Secondary | ICD-10-CM | POA: Diagnosis not present

## 2020-02-10 DIAGNOSIS — M9902 Segmental and somatic dysfunction of thoracic region: Secondary | ICD-10-CM | POA: Diagnosis not present

## 2020-02-10 DIAGNOSIS — M5125 Other intervertebral disc displacement, thoracolumbar region: Secondary | ICD-10-CM | POA: Diagnosis not present

## 2020-02-10 DIAGNOSIS — M50321 Other cervical disc degeneration at C4-C5 level: Secondary | ICD-10-CM | POA: Diagnosis not present

## 2020-03-11 DIAGNOSIS — M50321 Other cervical disc degeneration at C4-C5 level: Secondary | ICD-10-CM | POA: Diagnosis not present

## 2020-03-11 DIAGNOSIS — M5125 Other intervertebral disc displacement, thoracolumbar region: Secondary | ICD-10-CM | POA: Diagnosis not present

## 2020-03-11 DIAGNOSIS — M9901 Segmental and somatic dysfunction of cervical region: Secondary | ICD-10-CM | POA: Diagnosis not present

## 2020-03-11 DIAGNOSIS — M9902 Segmental and somatic dysfunction of thoracic region: Secondary | ICD-10-CM | POA: Diagnosis not present

## 2020-04-01 DIAGNOSIS — M9901 Segmental and somatic dysfunction of cervical region: Secondary | ICD-10-CM | POA: Diagnosis not present

## 2020-04-01 DIAGNOSIS — M9902 Segmental and somatic dysfunction of thoracic region: Secondary | ICD-10-CM | POA: Diagnosis not present

## 2020-04-01 DIAGNOSIS — M50321 Other cervical disc degeneration at C4-C5 level: Secondary | ICD-10-CM | POA: Diagnosis not present

## 2020-04-01 DIAGNOSIS — M5125 Other intervertebral disc displacement, thoracolumbar region: Secondary | ICD-10-CM | POA: Diagnosis not present

## 2020-04-06 ENCOUNTER — Ambulatory Visit: Payer: Medicare Other | Attending: Internal Medicine

## 2020-04-06 DIAGNOSIS — Z23 Encounter for immunization: Secondary | ICD-10-CM

## 2020-04-06 NOTE — Progress Notes (Signed)
   Covid-19 Vaccination Clinic  Name:  Jeffery Kirby    MRN: [id number] DOB: [birth year]  04/06/2020  Mr. Jeffery Kirby was observed post Covid-19 immunization for 15 minutes without incident. He was provided with Vaccine Information Sheet and instruction to access the V-Safe system.   Mr. Jeffery Kirby was instructed to call 911 with any severe reactions post vaccine: Marland Kitchen Difficulty breathing  . Swelling of face and throat  . A fast heartbeat  . A bad rash all over body  . Dizziness and weakness

## 2020-05-05 DIAGNOSIS — N402 Nodular prostate without lower urinary tract symptoms: Secondary | ICD-10-CM | POA: Diagnosis not present

## 2020-05-10 DIAGNOSIS — H5310 Unspecified subjective visual disturbances: Secondary | ICD-10-CM | POA: Diagnosis not present

## 2020-05-12 DIAGNOSIS — R972 Elevated prostate specific antigen [PSA]: Secondary | ICD-10-CM | POA: Diagnosis not present

## 2020-05-12 DIAGNOSIS — N402 Nodular prostate without lower urinary tract symptoms: Secondary | ICD-10-CM | POA: Diagnosis not present

## 2020-06-07 DIAGNOSIS — Z125 Encounter for screening for malignant neoplasm of prostate: Secondary | ICD-10-CM | POA: Diagnosis not present

## 2020-06-07 DIAGNOSIS — R7301 Impaired fasting glucose: Secondary | ICD-10-CM | POA: Diagnosis not present

## 2020-06-07 DIAGNOSIS — E785 Hyperlipidemia, unspecified: Secondary | ICD-10-CM | POA: Diagnosis not present

## 2020-06-14 DIAGNOSIS — Z1212 Encounter for screening for malignant neoplasm of rectum: Secondary | ICD-10-CM | POA: Diagnosis not present

## 2020-06-14 DIAGNOSIS — I209 Angina pectoris, unspecified: Secondary | ICD-10-CM | POA: Diagnosis not present

## 2020-06-14 DIAGNOSIS — R7301 Impaired fasting glucose: Secondary | ICD-10-CM | POA: Diagnosis not present

## 2020-06-14 DIAGNOSIS — I10 Essential (primary) hypertension: Secondary | ICD-10-CM | POA: Diagnosis not present

## 2020-06-14 DIAGNOSIS — Z1339 Encounter for screening examination for other mental health and behavioral disorders: Secondary | ICD-10-CM | POA: Diagnosis not present

## 2020-06-14 DIAGNOSIS — F320 Major depressive disorder, single episode, mild: Secondary | ICD-10-CM | POA: Diagnosis not present

## 2020-06-14 DIAGNOSIS — G250 Essential tremor: Secondary | ICD-10-CM | POA: Diagnosis not present

## 2020-06-14 DIAGNOSIS — K5909 Other constipation: Secondary | ICD-10-CM | POA: Diagnosis not present

## 2020-06-14 DIAGNOSIS — E785 Hyperlipidemia, unspecified: Secondary | ICD-10-CM | POA: Diagnosis not present

## 2020-06-14 DIAGNOSIS — Z0000 Encounter for general adult medical examination without abnormal findings: Secondary | ICD-10-CM | POA: Diagnosis not present

## 2020-06-14 DIAGNOSIS — N402 Nodular prostate without lower urinary tract symptoms: Secondary | ICD-10-CM | POA: Diagnosis not present

## 2020-06-14 DIAGNOSIS — R269 Unspecified abnormalities of gait and mobility: Secondary | ICD-10-CM | POA: Diagnosis not present

## 2020-06-14 DIAGNOSIS — Z1331 Encounter for screening for depression: Secondary | ICD-10-CM | POA: Diagnosis not present

## 2020-07-13 ENCOUNTER — Ambulatory Visit (INDEPENDENT_AMBULATORY_CARE_PROVIDER_SITE_OTHER): Payer: Medicare Other | Admitting: Internal Medicine

## 2020-07-13 ENCOUNTER — Encounter: Payer: Self-pay | Admitting: Internal Medicine

## 2020-07-13 ENCOUNTER — Other Ambulatory Visit: Payer: Self-pay

## 2020-07-13 VITALS — BP 110/76 | HR 69 | Ht 73.0 in | Wt 183.6 lb

## 2020-07-13 DIAGNOSIS — E785 Hyperlipidemia, unspecified: Secondary | ICD-10-CM

## 2020-07-13 DIAGNOSIS — R072 Precordial pain: Secondary | ICD-10-CM

## 2020-07-13 DIAGNOSIS — R079 Chest pain, unspecified: Secondary | ICD-10-CM

## 2020-07-13 DIAGNOSIS — I209 Angina pectoris, unspecified: Secondary | ICD-10-CM | POA: Diagnosis not present

## 2020-07-13 MED ORDER — METOPROLOL TARTRATE 50 MG PO TABS: ORAL_TABLET | ORAL | 0 refills | Status: DC

## 2020-07-13 MED ORDER — ASPIRIN EC 81 MG PO TBEC: 81.0000 mg | DELAYED_RELEASE_TABLET | Freq: Every day | ORAL | 3 refills | Status: AC

## 2020-07-13 NOTE — Progress Notes (Signed)
Cardiology Office Note:    Date:  07/13/2020   ID:  Jeffery Kirby, DOB 05-Jun-1946, MRN [id number]  PCP:  Marton Redwood, MD  [facility] Cardiologist:  No primary care provider on file.  [facility] Electrophysiologist:  None   CC: Chest pain Consulted for the evaluation of angina pectoris at the behest of Marton Redwood, MD  History of Present Illness:    Jeffery Kirby is a 75 y.o. male with a HTN and HLD who presents for evaluation.  Patient notes that he is feeling well up until one month prior to his yearly physical.  Noted that he has one episode that he was having chest pain with exertion that radiated into his neck.  This has happened twice since October/November. Has had chest tightness and an ache in the jaw.  Also have indigestion occasionally and feels completely different.  Discomfort occurs with the treadmill, StairMaster, and light weights; worsens with treadmill at high speed, and improves with rest.  Patient exertion notable for going to the gym with no other symptoms.No shortness of breath  at rest.  No PND or orthopnea.  No bendopnea, weight gain, leg swelling , or abdominal swelling.  No syncope or near syncope. Notes no palpitations or funny heart beats.     Patient reports NO prior cardiac testing including echo,  stress test,  heart catheterizations,  cardioversion,  ablations.  Ambulatory BP not done.   Past Medical History:  Diagnosis Date  . Angina pectoris ([place])   . Cataract   . Exertional chest pain   . Hyperlipidemia   . Hypertension   . Shingles 2012  . Tinnitus     Past Surgical History:  Procedure Laterality Date  . COLONOSCOPY    . ROTATOR CUFF REPAIR Bilateral   . wisdom teeth      Current Medications: Current Meds  Medication Sig  . naproxen (NAPROSYN) 500 MG tablet Take 500 mg by mouth 2 (two) times daily with a meal.  . simvastatin (ZOCOR) 20 MG tablet Take 20 mg by mouth daily.  . vitamin B-12 (CYANOCOBALAMIN) 1000 MCG tablet  Take 1,000 mcg by mouth 2 (two) times a week.     Allergies:   Patient has no known allergies.   Social History   Socioeconomic History  . Marital status: Married    Spouse name: Not on file  . Number of children: Not on file  . Years of education: Not on file  . Highest education level: Not on file  Occupational History  . 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 Smoking status: Never Smoker  . Smokeless tobacco: Never Used  Vaping Use  . Vaping Use: Never used  Substance and Sexual Activity  . Alcohol use: Yes    Comment: occasional beer  . Drug use: Not Currently  . Sexual activity: Not on file  Other Topics Concern  . Not on file  Social History Narrative  . Not on file   Social Determinants of Health   Financial Resource Strain: Not on file  Food Insecurity: Not on file  Transportation Needs: Not on file  Physical Activity: Not on file  Stress: Not on file  Social Connections: Not on file     Family History: The patient's family history includes COPD in his mother; Diabetes in his mother; Heart attack in his father, sister, and sister; Kidney disease in his mother. There is no history of Colon cancer, Colon polyps,  Esophageal cancer, Rectal cancer, or Stomach cancer. History of coronary artery disease notable for sisters and father. History of heart failure notable for no members. History of arrhythmia notable for no members. Sister died young at the age of 29 (passed in her sleep).  ROS:   Please see the history of present illness.     All other systems reviewed and are negative.  EKGs/Labs/Other Studies Reviewed:    The following studies were reviewed today:  EKG:   07/13/20: SR rate 69 1st Hb with Baseline artifact  Recent Labs: No results found for requested labs within last 8760 hours.  Recent Lipid Panel No results found for: CHOL, TRIG, HDL, CHOLHDL, VLDL, LDLCALC, LDLDIRECT  OSH Labs  06/07/2020 Creatinine 1.1 K 4.8 AST/ALT 13/13 Cholesterol 138 TGs 60 HDL 47 LDL 79 ApoB 76  Risk Assessment/Calculations:     N/A  Physical Exam:    VS:  BP 110/76   Pulse 69   Ht 6\' 1"  (1.854 m)   Wt 183 lb 9.6 oz (83.3 kg)   SpO2 96%   BMI 24.22 kg/m     Wt Readings from Last 3 Encounters:  07/13/20 183 lb 9.6 oz (83.3 kg)  07/19/18 184 lb (83.5 kg)  07/04/18 184 lb 6.4 oz (83.6 kg)    GEN:  Well nourished, well developed in no acute distress HEENT: Normal NECK: No JVD; No carotid bruits LYMPHATICS: No lymphadenopathy CARDIAC: RRR, no murmurs, rubs, gallops RESPIRATORY:  Clear to auscultation without rales, wheezing or rhonchi  ABDOMEN: Soft, non-tender, non-distended MUSCULOSKELETAL:  No edema; No deformity  SKIN: Warm and dry NEUROLOGIC:  Alert and oriented x 3 PSYCHIATRIC:  Normal affect   ASSESSMENT:    No diagnosis found. PLAN:    In order of problems listed above:  Angina Pectoris - The patient presents with angina pectoris; with possible family history of thoracic aortic aneurysm - start ASA 81 mg QD, statin  - Would recommend CCTA +/- FFR to exclude obstructive CAD   Hyperlipidemia (mixed) -LDL goal less than 100 -Recheck lipid profile LFTs - gave education on dietary changes  Essential Hypertension - ambulatory blood pressure not done, will start ambulatory BP monitoring; gave education on how to perform ambulatory blood pressure monitoring including the frequency and technique; goal ambulatory blood pressure < 135/85 on average  2-3 months follow up unless new symptoms or abnormal test results warranting change in plan  Would be reasonable for  APP Follow up   Medication Adjustments/Labs and Tests Ordered: Current medicines are reviewed at length with the patient today.  Concerns regarding medicines are outlined above.  No orders of the defined types were placed in this encounter.  No orders of the defined types were placed in this  encounter.   There are no Patient Instructions on file for this visit.   Signed, Werner Lean, MD  07/13/2020 2:37 PM    [place]

## 2020-07-13 NOTE — Patient Instructions (Addendum)
Medication Instructions:  1) START ASPIRIN 81 mg daily *If you need a refill on your cardiac medications before your next appointment, please call your pharmacy*  Follow-Up: Your provider recommends that you schedule a follow-up appointment in: 3 months with Dr. Gasper Sells.   Your cardiac CT will be scheduled at one of the below locations:   [facility] [address] [place], [place] [address] [phone]  [place] [address] [place], [place] [address] [phone]  If scheduled at [facility], please arrive at the [facility] main entrance of [facility] 30 minutes prior to test start time. Proceed to the [facility] Radiology Department (first floor) to check-in and test prep.  If scheduled at [facility], please arrive 15 mins early for check-in and test prep.  Please follow these instructions carefully (unless otherwise directed):  Hold all erectile dysfunction medications at least 3 days (72 hrs) prior to test.  On the Night Before the Test: . Be sure to Drink plenty of water. . Do not consume any caffeinated/decaffeinated beverages or chocolate 12 hours prior to your test. . Do not take any antihistamines 12 hours prior to your test.  On the Day of the Test: . Drink plenty of water. Do not drink any water within one hour of the test. . Do not eat any food 4 hours prior to the test. . You may take your regular medications prior to the test.  . Take metoprolol (Lopressor) 50 mg one to two hours prior to test. Bring your second tablet with you but do not take unless instructed.      After the Test: . Drink plenty of water. . After receiving IV contrast, you may experience a mild flushed feeling. This is normal. . On occasion, you may experience a mild rash up to 24 hours after the test. This is not dangerous. If this occurs, you can take Benadryl 25 mg  and increase your fluid intake. . If you experience trouble breathing, this can be serious. If it is severe call 911 IMMEDIATELY. If it is mild, please call our office.  Once we have confirmed authorization from your insurance company, we will call you to set up a date and time for your test. Based on how quickly your insurance processes prior authorizations requests, please allow up to 4 weeks to be contacted for scheduling your Cardiac CT appointment. Be advised that routine Cardiac CT appointments could be scheduled as many as 8 weeks after your provider has ordered it.  For non-scheduling related questions, please contact the cardiac imaging nurse navigator should you have any questions/concerns: Marchia Bond, Cardiac Imaging Nurse Navigator Burley Saver, Interim Cardiac Imaging Nurse [facility] Direct Office Dial: [phone]   For scheduling needs, including cancellations and rescheduling, please call [place], [phone].

## 2020-07-28 DIAGNOSIS — D2261 Melanocytic nevi of right upper limb, including shoulder: Secondary | ICD-10-CM | POA: Diagnosis not present

## 2020-07-28 DIAGNOSIS — D225 Melanocytic nevi of trunk: Secondary | ICD-10-CM | POA: Diagnosis not present

## 2020-07-28 DIAGNOSIS — D2262 Melanocytic nevi of left upper limb, including shoulder: Secondary | ICD-10-CM | POA: Diagnosis not present

## 2020-07-28 DIAGNOSIS — L723 Sebaceous cyst: Secondary | ICD-10-CM | POA: Diagnosis not present

## 2020-07-28 DIAGNOSIS — L820 Inflamed seborrheic keratosis: Secondary | ICD-10-CM | POA: Diagnosis not present

## 2020-07-28 DIAGNOSIS — L819 Disorder of pigmentation, unspecified: Secondary | ICD-10-CM | POA: Diagnosis not present

## 2020-07-28 DIAGNOSIS — L821 Other seborrheic keratosis: Secondary | ICD-10-CM | POA: Diagnosis not present

## 2020-07-28 DIAGNOSIS — Z85828 Personal history of other malignant neoplasm of skin: Secondary | ICD-10-CM | POA: Diagnosis not present

## 2020-07-28 DIAGNOSIS — L918 Other hypertrophic disorders of the skin: Secondary | ICD-10-CM | POA: Diagnosis not present

## 2020-07-28 DIAGNOSIS — L570 Actinic keratosis: Secondary | ICD-10-CM | POA: Diagnosis not present

## 2020-07-28 DIAGNOSIS — D2271 Melanocytic nevi of right lower limb, including hip: Secondary | ICD-10-CM | POA: Diagnosis not present

## 2020-07-28 DIAGNOSIS — D2272 Melanocytic nevi of left lower limb, including hip: Secondary | ICD-10-CM | POA: Diagnosis not present

## 2020-08-03 ENCOUNTER — Telehealth: Payer: Self-pay

## 2020-08-03 DIAGNOSIS — R079 Chest pain, unspecified: Secondary | ICD-10-CM

## 2020-08-03 NOTE — Telephone Encounter (Signed)
The patient will need BMET prior to CT. Left message to call back and arrange.  BMET ordered for linking.

## 2020-08-04 NOTE — Telephone Encounter (Signed)
Spoke with the patient and he will come by the office tomorrow 2/3 for BMET

## 2020-08-05 ENCOUNTER — Other Ambulatory Visit: Payer: Medicare Other

## 2020-08-05 ENCOUNTER — Other Ambulatory Visit: Payer: Self-pay

## 2020-08-05 DIAGNOSIS — R079 Chest pain, unspecified: Secondary | ICD-10-CM | POA: Diagnosis not present

## 2020-08-05 LAB — BASIC METABOLIC PANEL
BUN/Creatinine Ratio: 12 (ref 10–24)
BUN: 12 mg/dL (ref 8–27)
CO2: 24 mmol/L (ref 20–29)
Calcium: 9.2 mg/dL (ref 8.6–10.2)
Chloride: 103 mmol/L (ref 96–106)
Creatinine, Ser: 1.04 mg/dL (ref 0.76–1.27)
GFR calc Af Amer: 83 mL/min/{1.73_m2} (ref >59)
GFR calc non Af Amer: 71 mL/min/{1.73_m2} (ref >59)
Glucose: 89 mg/dL (ref 65–99)
Potassium: 4.3 mmol/L (ref 3.5–5.2)
Sodium: 140 mmol/L (ref 134–144)

## 2020-08-09 ENCOUNTER — Telehealth (HOSPITAL_COMMUNITY): Payer: Self-pay | Admitting: Emergency Medicine

## 2020-08-09 NOTE — Telephone Encounter (Signed)
Reaching out to patient to offer assistance regarding upcoming cardiac imaging study; pt verbalizes understanding of appt date/time, parking situation and where to check in, pre-test NPO status and medications ordered, and verified current allergies; name and call back number provided for further questions should they arise Caylan Chenard RN Navigator Cardiac Imaging [facility] [phone] office [phone] cell 

## 2020-08-10 ENCOUNTER — Ambulatory Visit (HOSPITAL_COMMUNITY)
Admission: RE | Admit: 2020-08-10 | Discharge: 2020-08-10 | Disposition: A | Discharge status: Home / Self Care | Payer: Medicare Other | Source: Ambulatory Visit | Attending: Internal Medicine | Admitting: Internal Medicine

## 2020-08-10 ENCOUNTER — Telehealth: Payer: Self-pay

## 2020-08-10 ENCOUNTER — Encounter: Payer: Self-pay | Admitting: Internal Medicine

## 2020-08-10 ENCOUNTER — Ambulatory Visit (INDEPENDENT_AMBULATORY_CARE_PROVIDER_SITE_OTHER): Payer: Medicare Other | Admitting: Internal Medicine

## 2020-08-10 ENCOUNTER — Encounter (HOSPITAL_COMMUNITY): Payer: Self-pay

## 2020-08-10 ENCOUNTER — Other Ambulatory Visit: Payer: Self-pay

## 2020-08-10 VITALS — BP 106/70 | HR 63 | Ht 73.0 in | Wt 184.0 lb

## 2020-08-10 DIAGNOSIS — I700 Atherosclerosis of aorta: Secondary | ICD-10-CM | POA: Diagnosis not present

## 2020-08-10 DIAGNOSIS — I10 Essential (primary) hypertension: Secondary | ICD-10-CM | POA: Diagnosis not present

## 2020-08-10 DIAGNOSIS — I209 Angina pectoris, unspecified: Secondary | ICD-10-CM

## 2020-08-10 DIAGNOSIS — R931 Abnormal findings on diagnostic imaging of heart and coronary circulation: Secondary | ICD-10-CM | POA: Insufficient documentation

## 2020-08-10 DIAGNOSIS — R079 Chest pain, unspecified: Secondary | ICD-10-CM

## 2020-08-10 DIAGNOSIS — I2510 Atherosclerotic heart disease of native coronary artery without angina pectoris: Secondary | ICD-10-CM | POA: Insufficient documentation

## 2020-08-10 DIAGNOSIS — Z006 Encounter for examination for normal comparison and control in clinical research program: Secondary | ICD-10-CM

## 2020-08-10 DIAGNOSIS — E785 Hyperlipidemia, unspecified: Secondary | ICD-10-CM | POA: Diagnosis not present

## 2020-08-10 DIAGNOSIS — R072 Precordial pain: Secondary | ICD-10-CM | POA: Diagnosis not present

## 2020-08-10 IMAGING — CT CT HEART MORP W/ CTA COR W/ SCORE W/ CA W/CM &/OR W/O CM
4 of 7 series · 8 of 20 positions shown, 9 images · non-contrast
Comparison: None.
COMPARISON: None.

Addendum:
EXAM:
OVER-READ INTERPRETATION  CT CHEST

The following report is an over-read performed by radiologist Dr.
over-read does not include interpretation of cardiac or coronary
anatomy or pathology. The coronary CTA interpretation by the
cardiologist is attached.
CLINICAL DATA: 74 Year-old White Male
Cardiac/Coronary CTA
TECHNIQUE: The patient was scanned on a [employer] Force scanner.

[Series 6: best diast 73 % · axial · 0.39mm/px · z∈[+1162,+1205]mm · 2 of 323 slices shown]
[im 108/323  vessel]
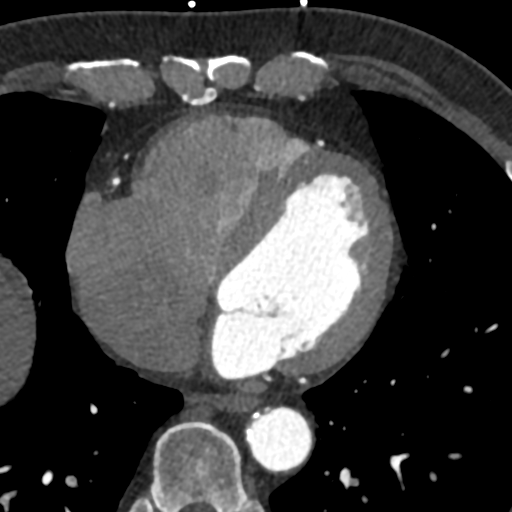
[im 215/323  vessel]
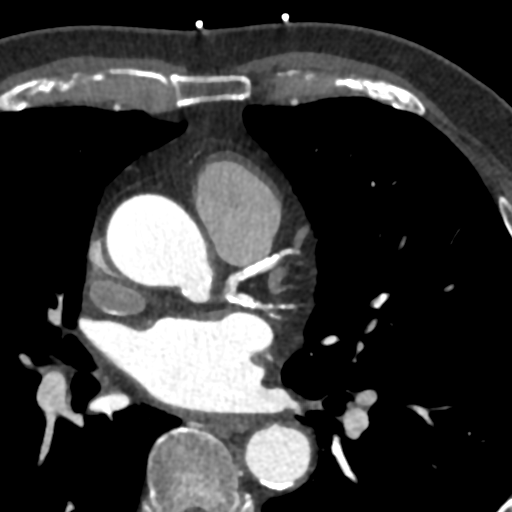

[Series 7: best syst · axial · 0.39mm/px · z∈[+1162,+1205]mm · 2 of 323 slices shown, 3 images]
[im 108/323  vessel]
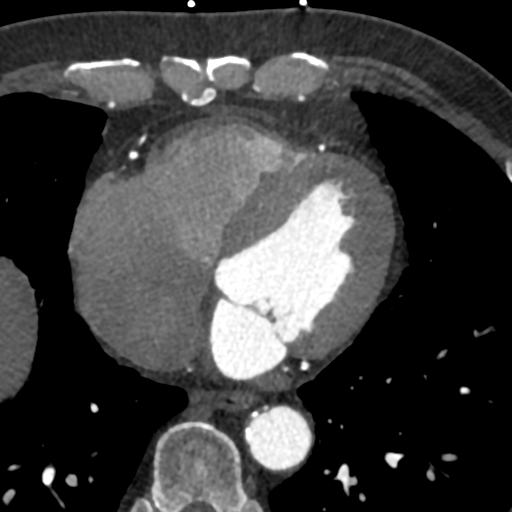
[im 108/323  lung]
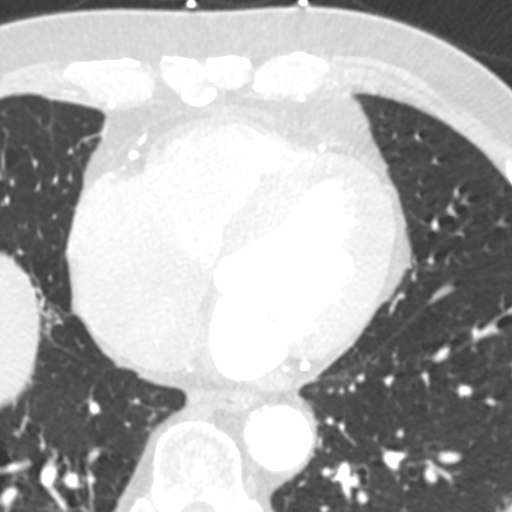
[im 215/323  vessel]
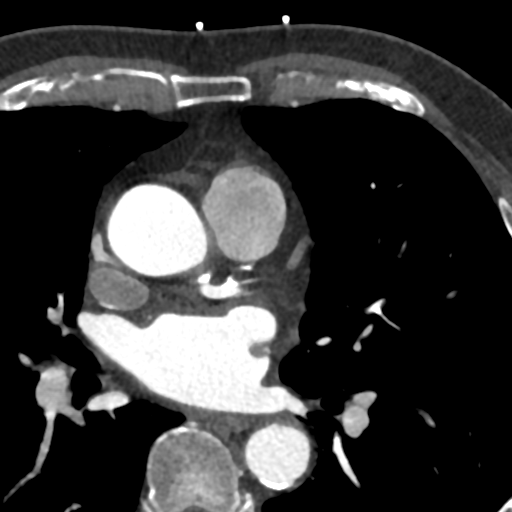

[Series 8: ts diast sharp 73 % · axial · 0.39mm/px · z∈[+1162,+1205]mm · 2 of 323 slices shown]
[im 108/323  lung]
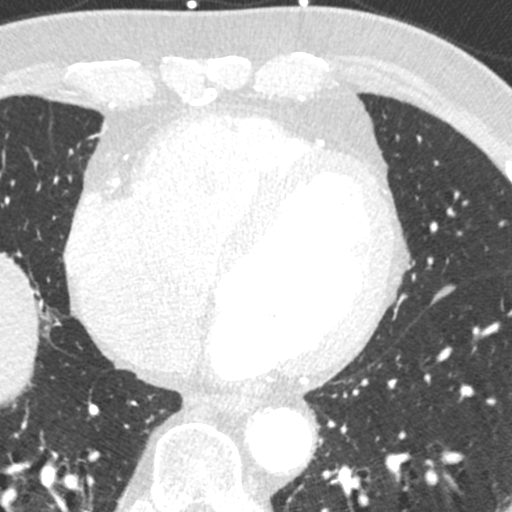
[im 215/323  lung]
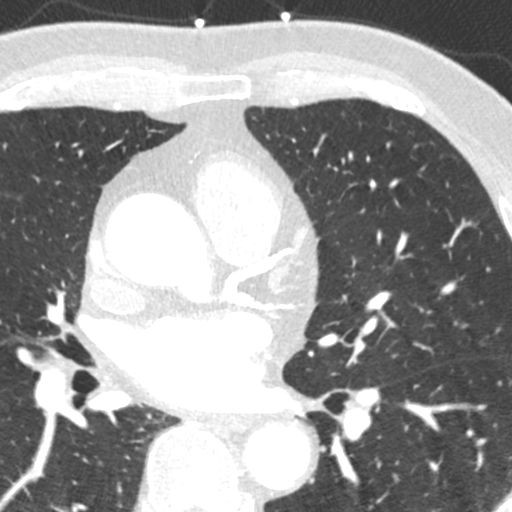

[Series 9: ts syst sharp · axial · 0.39mm/px · z∈[+1162,+1205]mm · 2 of 323 slices shown]
[im 108/323  lung]
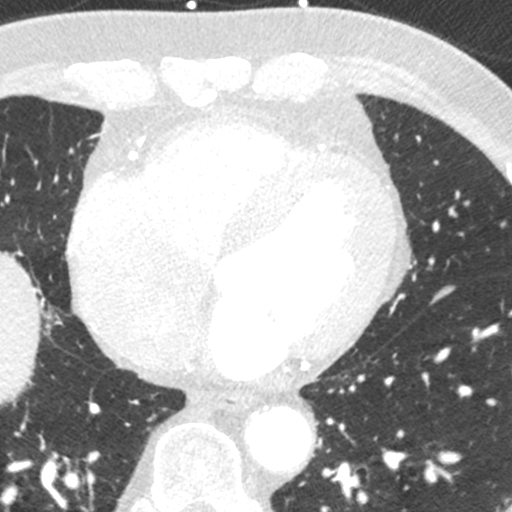
[im 215/323  lung]
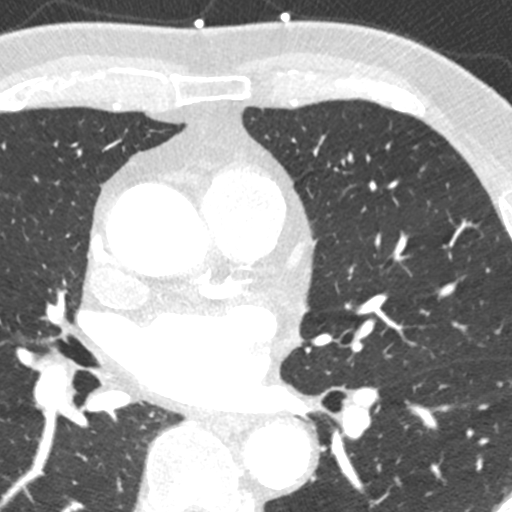

[8 of 20 positions shown; findings below may reference images not displayed]

FINDINGS: Vascular: No significant vascular findings. Normal heart size. No
pericardial effusion.

Mediastinum/Nodes: Visualized mediastinum and hilar regions
demonstrate calcified right hilar and subcarinal lymph nodes
consistent with prior granulomatous disease.

Lungs/Pleura: Visualized lungs show no evidence of pulmonary edema,
consolidation, pneumothorax, nodule or pleural fluid.

Upper Abdomen: No acute abnormality.

Musculoskeletal: No chest wall mass or suspicious bone lesions
identified.
IMPRESSION: Calcified right hilar and subcarinal lymph nodes consistent with
prior granulomatous disease.
FINDINGS: A 100 kV prospective scan was triggered in the descending thoracic
aorta at 111 HU's. Axial non-contrast 3 mm slices were carried out
through the heart. The data set was analyzed on a dedicated work
station and scored using the Agatson method. Gantry rotation speed
was 250 msecs and collimation was .6 mm. No beta blockade and 0.8 mg
of sl NTG was given. The 3D data set was reconstructed in 5%
intervals of the 67-82 % of the R-R cycle. Diastolic phases were
analyzed on a dedicated work station using MPR, MIP and VRT modes.
The patient received 100 cc of contrast.

Aorta: Upper limits of normal; ascending aortic size 39 mm, with
ascending aortic height index 2.10 cm/m. Descending aortic
atherosclerosis noted. No dissection.

Aortic Valve: Tri-leaflet. Multiple small annular calcifications
noted. Mild valvular calcification.

Coronary Arteries:  Normal coronary origin.  Right dominance.

Coronary calcium score of [SD]. This was 90th percentile for age,
sex, and race matched control.

RCA is a large dominant artery that gives rise to PDA and PLA. There
is a 70-99% mixed plaque stenosis, with a subsequent 50-70% mixed
plaque stenosis in the proximal vessel, there is a 50-70% mixed
plaque stenosis in the mid vessel, there is a 70-90% calcified
plaque stenosis in the distal vessel. In addition there are minimal
non-obstructive calcified plaques scattered through the mid/distal
vessel and its branches.

Left main is a large artery that gives rise to LAD and LCX arteries.
The ostia of the left main appear aneurysmal measuring 16 mm with
mild calcification. This narrows to 5 mm by the distal vessel. There
is 30% distal stenosis with a calcified plaque with napkin ring sign
(high risk feature).

LAD is a large vessel that gives off two diagonal vessels. There is
an ostial 50-70% calcified stenosis narrowing to a 70-99% mixed
plaque stenosis in the proximal vessel with 50-70% calcified plaque
stenosis in the mid vessel. There is a mild non obstructive (25-49%)
calcified plaque in the ostium of the D1 vessel. There is a mild non
obstructive (25-49%) calcified plaque in the ostium of the distal
vessel.

LCX is a non-dominant artery that gives rise to four obtuse marginal
vessels including a large OM4 branch that bifurcates. There is
moderate (50-70%) calcified stenosis in the proximal vessel with
multiple minimal non obstructive (1-24%) calcified plaques in the
mid and distant vessel. There is a minimal non obstructive (1-24%)
calcified plaque in the of the OM1 vessel. There is a minimal non
obstructive (1-24%) soft plaque in the of the OM3 vessel. There is a
mild non obstructive (25-49%) calcified plaque in the OM4 vessel
prior to the bifurcation.

Other findings:

Normal pulmonary vein drainage into the left atrium.

Normal left atrial appendage without a thrombus.

Normal size of the pulmonary artery.

Extra-cardiac findings: See attached radiology report for
non-cardiac structures.
IMPRESSION: 1. Coronary calcium score of [SD]. This was 90th percentile for age,
sex, and race matched control.

2. Normal coronary origin with right dominance.

3. CAD-RADS 4 Severe stenosis. (70-99% or > 50% left main). There is
left main, RCA, LAD, and LCX disease. Cardiac catheterization or CT
FFR is recommended. Consider symptom-guided anti-ischemic
pharmacotherapy as well as risk factor modification per guideline
directed care.

4.  Left main appears aneurysmal: Ostial diameter measured at 16 mm.

5. Upper limits of normal ascending aortic size 39 mm, with
ascending aortic height index 2.10 cm/m.

6.  Aortic atherosclerosis noted.

7.  Mild aortic valve calcification and mild annular calcification.

*** End of Addendum ***
EXAM:
OVER-READ INTERPRETATION  CT CHEST

The following report is an over-read performed by radiologist Dr.
over-read does not include interpretation of cardiac or coronary
anatomy or pathology. The coronary CTA interpretation by the
cardiologist is attached.
FINDINGS: Vascular: No significant vascular findings. Normal heart size. No
pericardial effusion.

Mediastinum/Nodes: Visualized mediastinum and hilar regions
demonstrate calcified right hilar and subcarinal lymph nodes
consistent with prior granulomatous disease.

Lungs/Pleura: Visualized lungs show no evidence of pulmonary edema,
consolidation, pneumothorax, nodule or pleural fluid.

Upper Abdomen: No acute abnormality.

Musculoskeletal: No chest wall mass or suspicious bone lesions
identified.
IMPRESSION: Calcified right hilar and subcarinal lymph nodes consistent with
prior granulomatous disease.

## 2020-08-10 MED ORDER — ROSUVASTATIN CALCIUM 20 MG PO TABS: 20.0000 mg | ORAL_TABLET | Freq: Every day | ORAL | 3 refills | Status: DC

## 2020-08-10 MED ORDER — NITROGLYCERIN 0.4 MG SL SUBL
SUBLINGUAL_TABLET | SUBLINGUAL | Status: AC
  Filled 2020-08-10: qty 2

## 2020-08-10 MED ORDER — IOHEXOL 350 MG/ML SOLN
100.0000 mL | Freq: Once | INTRAVENOUS | Status: AC | PRN
  Administered 2020-08-10: 100 mL via INTRAVENOUS

## 2020-08-10 MED ORDER — NITROGLYCERIN 0.4 MG SL SUBL
0.8000 mg | SUBLINGUAL_TABLET | Freq: Once | SUBLINGUAL | Status: AC
  Administered 2020-08-10: 0.8 mg via SUBLINGUAL

## 2020-08-10 NOTE — Progress Notes (Signed)
Cardiology Office Note:    Date:  08/10/2020   ID:  Jeffery Kirby, DOB 10/15/1945, MRN [id number]  PCP:  Shaw, William, MD  [facility] Cardiologist: Elayna Tobler MD [facility] Electrophysiologist:  None   CC: Chest pain Consulted for the evaluation of angina pectoris at the behest of Shaw, William, MD  History of Present Illness:    Jeffery Kirby is a 74 y.o. male with a HTN and HLD who presented for evaluation 07/13/20. In interim had Cardiac CT today which showed multi-vessel disease.  Patient notes that he is doing fine.  Since day prior/last visit notes no chest pain or jaw pain.  No SOB.  Relevant interval testing or therapy include CCTA.  There are no interval hospital/ED visit.    No chest pain or pressure .  No SOB/DOE and no PND/Orthopnea.  No weight gain or leg swelling.  No palpitations or syncope .   Past Medical History:  Diagnosis Date  . Angina pectoris (HCC)   . Cataract   . Exertional chest pain   . Hyperlipidemia   . Hypertension   . Shingles 2012  . Tinnitus     Past Surgical History:  Procedure Laterality Date  . COLONOSCOPY    . ROTATOR CUFF REPAIR Bilateral   . wisdom teeth      Current Medications: Current Meds  Medication Sig  . aspirin EC 81 MG tablet Take 1 tablet (81 mg total) by mouth daily. Swallow whole.  . escitalopram (LEXAPRO) 10 MG tablet Take 10 mg by mouth daily.  . metoprolol tartrate (LOPRESSOR) 50 MG tablet Take 50 mg (1 tablet) one - two hours prior to your CT scan. Bring the second tablet with you but do not take unless instructed.  . naproxen (NAPROSYN) 500 MG tablet Take 500 mg by mouth 2 (two) times daily with a meal.  . rosuvastatin (CRESTOR) 20 MG tablet Take 1 tablet (20 mg total) by mouth daily.  . vitamin B-12 (CYANOCOBALAMIN) 1000 MCG tablet Take 1,000 mcg by mouth 2 (two) times a week.  . [DISCONTINUED] simvastatin (ZOCOR) 20 MG tablet Take 20 mg by mouth daily.     Allergies:   Patient has no  known allergies.   Social History   Socioeconomic History  . Marital status: Married    Spouse name: Not on file  . Number of children: Not on file  . Years of education: Not on file  . Highest education level: Not on file  Occupational History  . 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 Smoking status: Never Smoker  . Smokeless tobacco: Never Used  Vaping Use  . Vaping Use: Never used  Substance and Sexual Activity  . Alcohol use: Yes    Comment: occasional beer  . Drug use: Not Currently  . Sexual activity: Not on file  Other Topics Concern  . Not on file  Social History Narrative  . Not on file   Social Determinants of Health   Financial Resource Strain: Not on file  Food Insecurity: Not on file  Transportation Needs: Not on file  Physical Activity: Not on file  Stress: Not on file  Social Connections: Not on file    Social: Went to [employer], married  Family History: The patient's family history includes COPD in his mother; Diabetes in his mother; Heart attack in his father, sister, and sister; Kidney disease in his mother. There is no history of Colon cancer,   Colon polyps, Esophageal cancer, Rectal cancer, or Stomach cancer. History of coronary artery disease notable for sisters and father. History of heart failure notable for no members. History of arrhythmia notable for no members. Sister died young at the age of 21 (passed in her sleep).  ROS:   Please see the history of present illness.     All other systems reviewed and are negative.  EKGs/Labs/Other Studies Reviewed:    The following studies were reviewed today:  EKG:   07/13/20: SR rate 69 1st Hb with Baseline artifact  Cardiac CT: Date 08/10/20 IMPRESSION: 1. Coronary calcium score of 1724. This was 90th percentile for age, sex, and race matched control.  2. Normal coronary origin with right dominance.  3. CAD-RADS 4 Severe stenosis. (70-99%  or > 50% left main). There is left main, RCA, LAD, and LCX disease. Cardiac catheterization or CT FFR is recommended. Consider symptom-guided anti-ischemic pharmacotherapy as well as risk factor modification per guideline directed care.  4.  Left main appears aneurysmal: Ostial diameter measured at 16 mm.  5. Upper limits of normal ascending aortic size 39 mm, with ascending aortic height index 2.10 cm/m.  6.  Aortic atherosclerosis noted.  7.  Mild aortic valve calcification and mild annular calcification.   Recent Labs: 08/05/2020: BUN 12; Creatinine, Ser 1.04; Potassium 4.3; Sodium 140  Recent Lipid Panel No results found for: CHOL, TRIG, HDL, CHOLHDL, VLDL, LDLCALC, LDLDIRECT  OSH Labs 06/07/2020 Creatinine 1.1 K 4.8 AST/ALT 13/13 Cholesterol 138 TGs 60 HDL 47 LDL 79 ApoB 76  Risk Assessment/Calculations:     N/A  Physical Exam:    VS:  BP 106/70    Pulse 63    Ht 6\' 1"  (1.854 m)    Wt 184 lb (83.5 kg)    SpO2 95%    BMI 24.28 kg/m     Wt Readings from Last 3 Encounters:  08/10/20 184 lb (83.5 kg)  07/13/20 183 lb 9.6 oz (83.3 kg)  07/19/18 184 lb (83.5 kg)    GEN:  Well nourished, well developed in no acute distress HEENT: Normal NECK: No JVD; No carotid bruits LYMPHATICS: No lymphadenopathy CARDIAC: RRR, no murmurs, rubs, gallops RESPIRATORY:  Clear to auscultation without rales, wheezing or rhonchi  ABDOMEN: Soft, non-tender, non-distended MUSCULOSKELETAL:  No edema; No deformity  SKIN: Warm and dry NEUROLOGIC:  Alert and oriented x 3 PSYCHIATRIC:  Normal affect   ASSESSMENT:    1. Chest pain, unspecified type   2. Hyperlipidemia, unspecified hyperlipidemia type   3. Angina pectoris ([place])   4. Aortic atherosclerosis ([place])   5. Essential hypertension    PLAN:    In order of problems listed above:  Angina Pectoris Positive Stress Test - The patient presents with angina pectoris; with possible family history of thoracic aortic aneurysm -  start ASA 81 mg QD, change statin to rosuvastatin 20 mg PO daily - Risks and benefits of cardiac catheterization have been discussed with the patient.  These include bleeding, infection, kidney damage, stroke, heart attack, death.  The patient understands these risks and is willing to proceed. - Discussed red flag symptoms for ED and emergency evaluation given his level of disease  Hyperlipidemia (mixed) Aortic Atherosclerosis -LDL goal less than 70 -Recheck lipid profile LFTs post cath - gave education on dietary changes  Essential Hypertension - ambulatory blood pressure not done, will start ambulatory BP monitoring; gave education on how to perform ambulatory blood pressure monitoring including the frequency and technique; goal ambulatory blood  pressure < 135/85 on average  Post Cath months follow up unless new symptoms or abnormal test results warranting change in plan  Time Spent Directly with Patient:   I have spent a total of 60 minutes with the patient reviewing  notes, imaging, EKGs, labs and examining the patient as well as establishing an assessment and plan that was discussed personally with the patient.  > 50% of time was spent in direct patient care discussing with patient and wife outcome and risks related to [facility].   Medication Adjustments/Labs and Tests Ordered: Current medicines are reviewed at length with the patient today.  Concerns regarding medicines are outlined above.  Orders Placed This Encounter  Procedures   CBC   EKG 12-Lead   Meds ordered this encounter  Medications   rosuvastatin (CRESTOR) 20 MG tablet    Sig: Take 1 tablet (20 mg total) by mouth daily.    Dispense:  90 tablet    Refill:  3    Patient Instructions  Medication Instructions:  Your physician has recommended you make the following change in your medication:    STOP: simvastatin  START: rosuvastatin 20 mg daily *If you need a refill on your cardiac medications before your next  appointment, please call your pharmacy*   Lab Work: TODAY: CBC  If you have labs (blood work) drawn today and your tests are completely normal, you will receive your results only by:  [place] (if you have MyChart) OR  A paper copy in the mail If you have any lab test that is abnormal or we need to change your treatment, we will call you to review the results.   Testing/Procedures: Your provider has scheduled you for a left heart cath  Due to recent COVID-19 restrictions implemented by our local and state authorities and in an effort to keep both patients and staff as safe as possible, our hospital system requires COVID-19 testing prior to certain scheduled hospital procedures.  Please go to [address]. [place], [place] [address] on 08/13/20 at 10:55am  .  This is a drive up testing site.  You will not need to exit your vehicle.  You will not be billed at the time of testing but may receive a bill later depending on your insurance. You must agree to self-quarantine from the time of your testing until the procedure date on 08/16/2020.  This should included staying home with ONLY the people you live with.  Avoid take-out, grocery store shopping or leaving the house for any non-emergent reason.  Failure to have your COVID-19 test done on the date and time you have been scheduled will result in cancellation of your procedure.  Please call our office at [phone] if you have any questions.    Follow-Up: At [facility], you and your health needs are our priority.  As part of our continuing mission to provide you with exceptional heart care, we have created designated Provider Care Teams.  These Care Teams include your primary Cardiologist (physician) and Advanced Practice Providers (APPs -  Physician Assistants and Nurse Practitioners) who all work together to provide you with the care you need, when you need it.     Your next appointment:   2-3 weeks  The format for your next  appointment:   In Person  Provider:   You may see Gasper Sells, MD or one of the following Advanced Practice Providers on your designated Care Team:    Melina Copa, PA-C  Ermalinda Barrios, PA-C  Other Instructions    [place] OFFICE [place], [place] [address] Dept: [phone] Loc: [phone]  Jeffery Kirby  08/10/2020  You are scheduled for a Cardiac Catheterization on Monday, February 14 with Dr. Harrell Gave End.  1. Please arrive at the [facility] (Main Entrance A) at [facility]: [place], [place] [address] at 5:30 AM (This time is two hours before your procedure to ensure your preparation). Free valet parking service is available.   Special note: Every effort is made to have your procedure done on time. Please understand that emergencies sometimes delay scheduled procedures.  2. Diet: Do not eat solid foods after midnight.  The patient may have clear liquids until 5am upon the day of the procedure.  3. Labs: You will need to have blood drawn on TODAY 08/10/20,  at [facility] at [facility] [address].   4. Medication instructions in preparation for your procedure:  Be sure to take your Aspirin 81mg      On the morning of your procedure, take your Aspirin 81mg .  You may use sips of water.  5. Plan for one night stay--bring personal belongings. 6. Bring a current list of your medications and current insurance cards. 7. You MUST have a responsible person to drive you home. 8. Someone MUST be with you the first 24 hours after you arrive home or your discharge will be delayed. 9. Please wear clothes that are easy to get on and off and wear slip-on shoes.  Thank you for allowing [place] to care for you!   -- [facility] Invasive Cardiovascular services      Signed, Werner Lean, MD  08/10/2020 4:53 PM    [place]

## 2020-08-10 NOTE — Telephone Encounter (Signed)
Scheduled the patient 08/12/20 at 0920.  Left message for the patient to call back to confirm appointment.

## 2020-08-10 NOTE — Research (Signed)
IDENTIFY Informed Consent                  Subject Name: Verlyn Lambert   Subject met inclusion and exclusion criteria.  The informed consent form, study requirements and expectations were reviewed with the subject and questions and concerns were addressed prior to the signing of the consent form.  The subject verbalized understanding of the trial requirements.  The subject agreed to participate in the IDENTIFY trial and signed the informed consent at 09:13AM on 08/10/20.  The informed consent was obtained prior to performance of any protocol-specific procedures for the subject.  A copy of the signed informed consent was given to the subject and a copy was placed in the subject's medical record.   Meade Maw, Naval architect

## 2020-08-10 NOTE — H&P (View-Only) (Signed)
Cardiology Office Note:    Date:  08/10/2020   ID:  Jeffery Kirby, DOB 05/18/1946, MRN [id number]  PCP:  Marton Redwood, MD  [facility] Cardiologist: Rudean Haskell MD [place] Electrophysiologist:  None   CC: Chest pain Consulted for the evaluation of angina pectoris at the behest of Marton Redwood, MD  History of Present Illness:    Jeffery Kirby is a 75 y.o. male with a HTN and HLD who presented for evaluation 07/13/20. In interim had Cardiac CT today which showed multi-vessel disease.  Patient notes that he is doing fine.  Since day prior/last visit notes no chest pain or jaw pain.  No SOB.  Relevant interval testing or therapy include CCTA.  There are no interval hospital/ED visit.    No chest pain or pressure .  No SOB/DOE and no PND/Orthopnea.  No weight gain or leg swelling.  No palpitations or syncope .   Past Medical History:  Diagnosis Date  . Angina pectoris ([place])   . Cataract   . Exertional chest pain   . Hyperlipidemia   . Hypertension   . Shingles 2012  . Tinnitus     Past Surgical History:  Procedure Laterality Date  . COLONOSCOPY    . ROTATOR CUFF REPAIR Bilateral   . wisdom teeth      Current Medications: Current Meds  Medication Sig  . aspirin EC 81 MG tablet Take 1 tablet (81 mg total) by mouth daily. Swallow whole.  . escitalopram (LEXAPRO) 10 MG tablet Take 10 mg by mouth daily.  . metoprolol tartrate (LOPRESSOR) 50 MG tablet Take 50 mg (1 tablet) one - two hours prior to your CT scan. Bring the second tablet with you but do not take unless instructed.  . naproxen (NAPROSYN) 500 MG tablet Take 500 mg by mouth 2 (two) times daily with a meal.  . rosuvastatin (CRESTOR) 20 MG tablet Take 1 tablet (20 mg total) by mouth daily.  . vitamin B-12 (CYANOCOBALAMIN) 1000 MCG tablet Take 1,000 mcg by mouth 2 (two) times a week.  . [DISCONTINUED] simvastatin (ZOCOR) 20 MG tablet Take 20 mg by mouth daily.     Allergies:   Patient has no  known allergies.   Social History   Socioeconomic History  . Marital status: Married    Spouse name: Not on file  . Number of children: Not on file  . Years of education: Not on file  . Highest education level: Not on file  Occupational History  . 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 Smoking status: Never Smoker  . Smokeless tobacco: Never Used  Vaping Use  . Vaping Use: Never used  Substance and Sexual Activity  . Alcohol use: Yes    Comment: occasional beer  . Drug use: Not Currently  . Sexual activity: Not on file  Other Topics Concern  . Not on file  Social History Narrative  . Not on file   Social Determinants of Health   Financial Resource Strain: Not on file  Food Insecurity: Not on file  Transportation Needs: Not on file  Physical Activity: Not on file  Stress: Not on file  Social Connections: Not on file    Social: Went to [employer], married  Family History: The patient's family history includes COPD in his mother; Diabetes in his mother; Heart attack in his father, sister, and sister; Kidney disease in his mother. There is no history of Colon cancer,  Colon polyps, Esophageal cancer, Rectal cancer, or Stomach cancer. History of coronary artery disease notable for sisters and father. History of heart failure notable for no members. History of arrhythmia notable for no members. Sister died young at the age of 27 (passed in her sleep).  ROS:   Please see the history of present illness.     All other systems reviewed and are negative.  EKGs/Labs/Other Studies Reviewed:    The following studies were reviewed today:  EKG:   07/13/20: SR rate 69 1st Hb with Baseline artifact  Cardiac CT: Date 08/10/20 IMPRESSION: 1. Coronary calcium score of 1724. This was 90th percentile for age, sex, and race matched control.  2. Normal coronary origin with right dominance.  3. CAD-RADS 4 Severe stenosis. (70-99%  or > 50% left main). There is left main, RCA, LAD, and LCX disease. Cardiac catheterization or CT FFR is recommended. Consider symptom-guided anti-ischemic pharmacotherapy as well as risk factor modification per guideline directed care.  4.  Left main appears aneurysmal: Ostial diameter measured at 16 mm.  5. Upper limits of normal ascending aortic size 39 mm, with ascending aortic height index 2.10 cm/m.  6.  Aortic atherosclerosis noted.  7.  Mild aortic valve calcification and mild annular calcification.   Recent Labs: 08/05/2020: BUN 12; Creatinine, Ser 1.04; Potassium 4.3; Sodium 140  Recent Lipid Panel No results found for: CHOL, TRIG, HDL, CHOLHDL, VLDL, LDLCALC, LDLDIRECT  OSH Labs 06/07/2020 Creatinine 1.1 K 4.8 AST/ALT 13/13 Cholesterol 138 TGs 60 HDL 47 LDL 79 ApoB 76  Risk Assessment/Calculations:     N/A  Physical Exam:    VS:  BP 106/70   Pulse 63   Ht 6\' 1"  (1.854 m)   Wt 184 lb (83.5 kg)   SpO2 95%   BMI 24.28 kg/m     Wt Readings from Last 3 Encounters:  08/10/20 184 lb (83.5 kg)  07/13/20 183 lb 9.6 oz (83.3 kg)  07/19/18 184 lb (83.5 kg)    GEN:  Well nourished, well developed in no acute distress HEENT: Normal NECK: No JVD; No carotid bruits LYMPHATICS: No lymphadenopathy CARDIAC: RRR, no murmurs, rubs, gallops RESPIRATORY:  Clear to auscultation without rales, wheezing or rhonchi  ABDOMEN: Soft, non-tender, non-distended MUSCULOSKELETAL:  No edema; No deformity  SKIN: Warm and dry NEUROLOGIC:  Alert and oriented x 3 PSYCHIATRIC:  Normal affect   ASSESSMENT:    1. Chest pain, unspecified type   2. Hyperlipidemia, unspecified hyperlipidemia type   3. Angina pectoris ([place])   4. Aortic atherosclerosis ([place])   5. Essential hypertension    PLAN:    In order of problems listed above:  Angina Pectoris Positive Stress Test - The patient presents with angina pectoris; with possible family history of thoracic aortic aneurysm -  start ASA 81 mg QD, change statin to rosuvastatin 20 mg PO daily - Risks and benefits of cardiac catheterization have been discussed with the patient.  These include bleeding, infection, kidney damage, stroke, heart attack, death.  The patient understands these risks and is willing to proceed. - Discussed red flag symptoms for ED and emergency evaluation given his level of disease  Hyperlipidemia (mixed) Aortic Atherosclerosis -LDL goal less than 70 -Recheck lipid profile LFTs post cath - gave education on dietary changes  Essential Hypertension - ambulatory blood pressure not done, will start ambulatory BP monitoring; gave education on how to perform ambulatory blood pressure monitoring including the frequency and technique; goal ambulatory blood pressure < 135/85 on average  Post Cath months follow up unless new symptoms or abnormal test results warranting change in plan  Time Spent Directly with Patient:   I have spent a total of 60 minutes with the patient reviewing  notes, imaging, EKGs, labs and examining the patient as well as establishing an assessment and plan that was discussed personally with the patient.  > 50% of time was spent in direct patient care discussing with patient and wife outcome and risks related to [facility].   Medication Adjustments/Labs and Tests Ordered: Current medicines are reviewed at length with the patient today.  Concerns regarding medicines are outlined above.  Orders Placed This Encounter  Procedures  . CBC  . EKG 12-Lead   Meds ordered this encounter  Medications  . rosuvastatin (CRESTOR) 20 MG tablet    Sig: Take 1 tablet (20 mg total) by mouth daily.    Dispense:  90 tablet    Refill:  3    Patient Instructions  Medication Instructions:  Your physician has recommended you make the following change in your medication:    STOP: simvastatin  START: rosuvastatin 20 mg daily *If you need a refill on your cardiac medications before your next  appointment, please call your pharmacy*   Lab Work: TODAY: CBC  If you have labs (blood work) drawn today and your tests are completely normal, you will receive your results only by: [place] MyChart Message (if you have MyChart) OR . A paper copy in the mail If you have any lab test that is abnormal or we need to change your treatment, we will call you to review the results.   Testing/Procedures: Your provider has scheduled you for a left heart cath  Due to recent COVID-19 restrictions implemented by our local and state authorities and in an effort to keep both patients and staff as safe as possible, our hospital system requires COVID-19 testing prior to certain scheduled hospital procedures.  Please go to [address]. [place], [place] [address] on 08/13/20 at 10:55am  .  This is a drive up testing site.  You will not need to exit your vehicle.  You will not be billed at the time of testing but may receive a bill later depending on your insurance. You must agree to self-quarantine from the time of your testing until the procedure date on 08/16/2020.  This should included staying home with ONLY the people you live with.  Avoid take-out, grocery store shopping or leaving the house for any non-emergent reason.  Failure to have your COVID-19 test done on the date and time you have been scheduled will result in cancellation of your procedure.  Please call our office at [phone] if you have any questions.    Follow-Up: At [facility] you and your health needs are our priority.  As part of our continuing mission to provide you with exceptional heart care, we have created designated Provider Care Teams.  These Care Teams include your primary Cardiologist (physician) and Advanced Practice Providers (APPs -  Physician Assistants and Nurse Practitioners) who all work together to provide you with the care you need, when you need it.     Your next appointment:   2-3 weeks  The format for your next  appointment:   In Person  Provider:   You may see Gasper Sells, MD or one of the following Advanced Practice Providers on your designated Care Team:    Melina Copa, PA-C  Ermalinda Barrios, PA-C    Other Instructions  [place] OFFICE [place], [place] [address] Dept: [phone] Loc: [phone]  Shaydon Lease  08/10/2020  You are scheduled for a Cardiac Catheterization on Monday, February 14 with Dr. Harrell Gave End.  1. Please arrive at the [facility] (Main Entrance A) at [facility]: [place], [place] [address] at 5:30 AM (This time is two hours before your procedure to ensure your preparation). Free valet parking service is available.   Special note: Every effort is made to have your procedure done on time. Please understand that emergencies sometimes delay scheduled procedures.  2. Diet: Do not eat solid foods after midnight.  The patient may have clear liquids until 5am upon the day of the procedure.  3. Labs: You will need to have blood drawn on TODAY 08/10/20,  at [facility] at [facility]. [address].   4. Medication instructions in preparation for your procedure:  Be sure to take your Aspirin 81mg      On the morning of your procedure, take your Aspirin 81mg .  You may use sips of water.  5. Plan for one night stay--bring personal belongings. 6. Bring a current list of your medications and current insurance cards. 7. You MUST have a responsible person to drive you home. 8. Someone MUST be with you the first 24 hours after you arrive home or your discharge will be delayed. 9. Please wear clothes that are easy to get on and off and wear slip-on shoes.  Thank you for allowing [place] to care for you!   -- [facility] Invasive Cardiovascular services      Signed, Werner Lean, MD  08/10/2020 4:53 PM    [place]

## 2020-08-10 NOTE — Patient Instructions (Addendum)
Medication Instructions:  Your physician has recommended you make the following change in your medication:    STOP: simvastatin  START: rosuvastatin 20 mg daily *If you need a refill on your cardiac medications before your next appointment, please call your pharmacy*   Lab Work: TODAY: CBC  If you have labs (blood work) drawn today and your tests are completely normal, you will receive your results only by: Marland Kitchen MyChart Message (if you have MyChart) OR . A paper copy in the mail If you have any lab test that is abnormal or we need to change your treatment, we will call you to review the results.   Testing/Procedures: Your provider has scheduled you for a left heart cath  Due to recent COVID-19 restrictions implemented by our local and state authorities and in an effort to keep both patients and staff as safe as possible, our hospital system requires COVID-19 testing prior to certain scheduled hospital procedures.  Please go to [place]. [place], [place] [address] on 08/13/20 at 10:55am  .  This is a drive up testing site.  You will not need to exit your vehicle.  You will not be billed at the time of testing but may receive a bill later depending on your insurance. You must agree to self-quarantine from the time of your testing until the procedure date on 08/16/2020.  This should included staying home with ONLY the people you live with.  Avoid take-out, grocery store shopping or leaving the house for any non-emergent reason.  Failure to have your COVID-19 test done on the date and time you have been scheduled will result in cancellation of your procedure.  Please call our office at [phone] if you have any questions.    Follow-Up: At [facility], you and your health needs are our priority.  As part of our continuing mission to provide you with exceptional heart care, we have created designated Provider Care Teams.  These Care Teams include your primary Cardiologist (physician) and  Advanced Practice Providers (APPs -  Physician Assistants and Nurse Practitioners) who all work together to provide you with the care you need, when you need it.     Your next appointment:   2-3 weeks  The format for your next appointment:   In Person  Provider:   You may see Gasper Sells, MD or one of the following Advanced Practice Providers on your designated Care Team:    Melina Copa, PA-C  Ermalinda Barrios, PA-C    Other Instructions    [facility] OFFICE [place], [place] [address] Dept: [phone] Loc: [phone]  [place]  08/10/2020  You are scheduled for a Cardiac Catheterization on Monday, February 14 with Dr. Harrell Gave End.  1. Please arrive at the [facility] (Main Entrance A) at [facility]: [address], [place] [address] at 5:30 AM (This time is two hours before your procedure to ensure your preparation). Free valet parking service is available.   Special note: Every effort is made to have your procedure done on time. Please understand that emergencies sometimes delay scheduled procedures.  2. Diet: Do not eat solid foods after midnight.  The patient may have clear liquids until 5am upon the day of the procedure.  3. Labs: You will need to have blood drawn on TODAY 08/10/20,  at [facility] at [facility]. [address]   4. Medication instructions in  preparation for your procedure:  Be sure to take your Aspirin 81mg      On the morning of your procedure, take your Aspirin 81mg .  You may use sips of water.  5. Plan for one night stay--bring personal belongings. 6. Bring a current list of your medications and current insurance cards. 7. You MUST have a responsible person to drive you home. 8. Someone MUST be with you the first 24 hours after you arrive home or your discharge will be delayed. 9. Please wear clothes that  are easy to get on and off and wear slip-on shoes.  Thank you for allowing [place] to care for you!   -- [facility]

## 2020-08-10 NOTE — Telephone Encounter (Signed)
-----   Message from Werner Lean, MD sent at 08/10/2020  1:03 PM EST ----- Results: Multi-vessel disease Plan: Called Patient- needs overbook for Thursday Afternoon to discuss LHC and get testing.  Werner Lean, MD

## 2020-08-11 ENCOUNTER — Telehealth: Payer: Self-pay | Admitting: Internal Medicine

## 2020-08-11 DIAGNOSIS — E785 Hyperlipidemia, unspecified: Secondary | ICD-10-CM

## 2020-08-11 DIAGNOSIS — I2510 Atherosclerotic heart disease of native coronary artery without angina pectoris: Secondary | ICD-10-CM | POA: Diagnosis not present

## 2020-08-11 DIAGNOSIS — I700 Atherosclerosis of aorta: Secondary | ICD-10-CM | POA: Diagnosis not present

## 2020-08-11 LAB — CBC
Hematocrit: 43.5 % (ref 37.5–51.0)
Hemoglobin: 15.1 g/dL (ref 13.0–17.7)
MCH: 30.6 pg (ref 26.6–33.0)
MCHC: 34.7 g/dL (ref 31.5–35.7)
MCV: 88 fL (ref 79–97)
Platelets: 203 10*3/uL (ref 150–450)
RBC: 4.93 x10E6/uL (ref 4.14–5.80)
RDW: 12.4 % (ref 11.6–15.4)
WBC: 8.7 10*3/uL (ref 3.4–10.8)

## 2020-08-11 MED ORDER — ROSUVASTATIN CALCIUM 20 MG PO TABS: 20.0000 mg | ORAL_TABLET | Freq: Every day | ORAL | 3 refills | Status: DC

## 2020-08-11 NOTE — Telephone Encounter (Signed)
The patient has been notified of the result and verbalized understanding.  All questions (if any) were answered. Nuala Alpha, LPN 01/05/8831 54:98 AM    Pt did request that we send his rosuvastatin prescription to [facility] [address], [facility] an [facility] on this medication yesterday, and it will cost him over $300 for a 90 day supply. Updated [facility] as his preferred pharmacy of choice and sent in script for rosuvastatin 20 mg po daily there.  Informed the pt that if the cost of this medication is high at [facility], then he should call our office back and inform us of this, for we can get our Prior Auth Nurse to run a prior auth on this medication and get this at a lower cost for him. Pt verbalized understanding and agrees with this plan.  Pt states he will keep [place] posted as needed, if a prior auth needs to be initiated on his rosuvastatin script. Pt was gracious for all the assistance provided.

## 2020-08-11 NOTE — Telephone Encounter (Signed)
-----   Message from Werner Lean, MD sent at 08/11/2020  9:00 AM EST ----- Results: WNL Plan: Continue to cath  Werner Lean, MD

## 2020-08-11 NOTE — Telephone Encounter (Signed)
    Pt is returning call to get lab result  Pt c/o medication issue:  1. Name of Medication:   rosuvastatin (CRESTOR) 20 MG tablet    2. How are you currently taking this medication (dosage and times per day)? Take 1 tablet (20 mg total) by mouth daily.  3. Are you having a reaction (difficulty breathing--STAT)?   4. What is your medication issue? Pt tried to pick up this prescription and it was expensive, he wanted to ask to get written prescription so he can bring it to different pharmacy to find cheaper price.

## 2020-08-11 NOTE — Telephone Encounter (Signed)
Note The patient has been notified of the result and verbalized understanding.  All questions (if any) were answered. Nuala Alpha, LPN 0/02/6760 95:09 AM    Pt did request that we send his rosuvastatin prescription to [facility] [address], [facility] an [facility] on this medication yesterday, and it will cost him over $300 for a 90 day supply. Updated [facility] as his preferred pharmacy of choice and sent in script for rosuvastatin 20 mg po daily there.  Informed the pt that if the cost of this medication is high at [facility], then he should call our office back and inform us of this, for we can get our Prior Auth Nurse to run a prior auth on this medication and get this at a lower cost for him. Pt verbalized understanding and agrees with this plan.  Pt states he will keep [place] posted as needed, if a prior auth needs to be initiated on his rosuvastatin script. Pt was gracious for all the assistance provided.

## 2020-08-12 ENCOUNTER — Ambulatory Visit: Payer: Medicare Other | Admitting: Internal Medicine

## 2020-08-12 DIAGNOSIS — N402 Nodular prostate without lower urinary tract symptoms: Secondary | ICD-10-CM | POA: Diagnosis not present

## 2020-08-13 ENCOUNTER — Telehealth (HOSPITAL_COMMUNITY): Payer: Self-pay | Admitting: Emergency Medicine

## 2020-08-13 ENCOUNTER — Other Ambulatory Visit (HOSPITAL_COMMUNITY)
Admission: RE | Admit: 2020-08-13 | Discharge: 2020-08-13 | Disposition: A | Discharge status: Home / Self Care | Payer: Medicare Other | Source: Ambulatory Visit | Attending: Internal Medicine | Admitting: Internal Medicine

## 2020-08-13 DIAGNOSIS — Z20822 Contact with and (suspected) exposure to covid-19: Secondary | ICD-10-CM | POA: Insufficient documentation

## 2020-08-13 DIAGNOSIS — Z01812 Encounter for preprocedural laboratory examination: Secondary | ICD-10-CM | POA: Diagnosis not present

## 2020-08-13 LAB — SARS CORONAVIRUS 2 (TAT 6-24 HRS): SARS Coronavirus 2: NEGATIVE

## 2020-08-13 NOTE — Telephone Encounter (Signed)
Pt calling with questions related to upcoming cath on Monday 2/14  Please give call back.  Marchia Bond RN Navigator Cardiac Imaging [facility] [phone] Office  [phone] Cell

## 2020-08-13 NOTE — Progress Notes (Signed)
Spoke with patient regarding procedure instructions on Monday.  Reviewed the following instructions  Arrival time 5:30 Nothing to eat or drink after midnight Need responsible person to drive you home as well as stay over night with you Ok to take morning medications on Monday, including a [employer]

## 2020-08-16 ENCOUNTER — Encounter (HOSPITAL_COMMUNITY): Payer: Self-pay | Admitting: Internal Medicine

## 2020-08-16 ENCOUNTER — Encounter (HOSPITAL_COMMUNITY)
Admission: RE | Disposition: A | Discharge status: Home / Self Care | Payer: Self-pay | Source: Home / Self Care | Attending: Internal Medicine

## 2020-08-16 ENCOUNTER — Other Ambulatory Visit: Payer: Self-pay

## 2020-08-16 ENCOUNTER — Ambulatory Visit: Payer: Medicare Other | Admitting: Internal Medicine

## 2020-08-16 ENCOUNTER — Ambulatory Visit (HOSPITAL_COMMUNITY)
Admission: RE | Admit: 2020-08-16 | Discharge: 2020-08-16 | Disposition: A | Discharge status: Home / Self Care | Payer: Medicare Other | Attending: Internal Medicine | Admitting: Internal Medicine

## 2020-08-16 DIAGNOSIS — I10 Essential (primary) hypertension: Secondary | ICD-10-CM | POA: Diagnosis not present

## 2020-08-16 DIAGNOSIS — R079 Chest pain, unspecified: Secondary | ICD-10-CM

## 2020-08-16 DIAGNOSIS — E782 Mixed hyperlipidemia: Secondary | ICD-10-CM | POA: Diagnosis not present

## 2020-08-16 DIAGNOSIS — I209 Angina pectoris, unspecified: Secondary | ICD-10-CM | POA: Diagnosis present

## 2020-08-16 DIAGNOSIS — Z7982 Long term (current) use of aspirin: Secondary | ICD-10-CM | POA: Insufficient documentation

## 2020-08-16 DIAGNOSIS — I700 Atherosclerosis of aorta: Secondary | ICD-10-CM | POA: Insufficient documentation

## 2020-08-16 DIAGNOSIS — Z79899 Other long term (current) drug therapy: Secondary | ICD-10-CM | POA: Insufficient documentation

## 2020-08-16 DIAGNOSIS — R931 Abnormal findings on diagnostic imaging of heart and coronary circulation: Secondary | ICD-10-CM | POA: Diagnosis not present

## 2020-08-16 DIAGNOSIS — I25119 Atherosclerotic heart disease of native coronary artery with unspecified angina pectoris: Secondary | ICD-10-CM | POA: Insufficient documentation

## 2020-08-16 HISTORY — PX: LEFT HEART CATH AND CORONARY ANGIOGRAPHY: CATH118249

## 2020-08-16 SURGERY — LEFT HEART CATH AND CORONARY ANGIOGRAPHY
Anesthesia: LOCAL

## 2020-08-16 MED ORDER — MIDAZOLAM HCL 2 MG/2ML IJ SOLN
INTRAMUSCULAR | Status: DC | PRN
  Administered 2020-08-16: 1 mg via INTRAVENOUS

## 2020-08-16 MED ORDER — SODIUM CHLORIDE 0.9 % WEIGHT BASED INFUSION: 1.0000 mL/kg/h | INTRAVENOUS | Status: DC

## 2020-08-16 MED ORDER — METOPROLOL TARTRATE 25 MG PO TABS: 25.0000 mg | ORAL_TABLET | Freq: Two times a day (BID) | ORAL | 5 refills | Status: DC

## 2020-08-16 MED ORDER — VERAPAMIL HCL 2.5 MG/ML IV SOLN
INTRAVENOUS | Status: AC
  Filled 2020-08-16: qty 2

## 2020-08-16 MED ORDER — LIDOCAINE HCL (PF) 1 % IJ SOLN
INTRAMUSCULAR | Status: AC
  Filled 2020-08-16: qty 30

## 2020-08-16 MED ORDER — HEPARIN SODIUM (PORCINE) 1000 UNIT/ML IJ SOLN
INTRAMUSCULAR | Status: DC | PRN
  Administered 2020-08-16: 4000 [IU] via INTRAVENOUS

## 2020-08-16 MED ORDER — LABETALOL HCL 5 MG/ML IV SOLN: 10.0000 mg | INTRAVENOUS | Status: DC | PRN

## 2020-08-16 MED ORDER — ONDANSETRON HCL 4 MG/2ML IJ SOLN: 4.0000 mg | Freq: Four times a day (QID) | INTRAMUSCULAR | Status: DC | PRN

## 2020-08-16 MED ORDER — ASPIRIN 81 MG PO CHEW: 81.0000 mg | CHEWABLE_TABLET | ORAL | Status: DC

## 2020-08-16 MED ORDER — HEPARIN (PORCINE) IN NACL 1000-0.9 UT/500ML-% IV SOLN
INTRAVENOUS | Status: DC | PRN
  Administered 2020-08-16 (×3): 500 mL

## 2020-08-16 MED ORDER — MIDAZOLAM HCL 2 MG/2ML IJ SOLN
INTRAMUSCULAR | Status: AC
  Filled 2020-08-16: qty 2

## 2020-08-16 MED ORDER — SODIUM CHLORIDE 0.9% FLUSH: 3.0000 mL | Freq: Two times a day (BID) | INTRAVENOUS | Status: DC

## 2020-08-16 MED ORDER — LIDOCAINE HCL (PF) 1 % IJ SOLN
INTRAMUSCULAR | Status: DC | PRN
  Administered 2020-08-16: 2 mL

## 2020-08-16 MED ORDER — HEPARIN (PORCINE) IN NACL 1000-0.9 UT/500ML-% IV SOLN
INTRAVENOUS | Status: AC
  Filled 2020-08-16: qty 1000

## 2020-08-16 MED ORDER — SODIUM CHLORIDE 0.9% FLUSH: 3.0000 mL | INTRAVENOUS | Status: DC | PRN

## 2020-08-16 MED ORDER — SODIUM CHLORIDE 0.9 % IV SOLN: INTRAVENOUS | Status: DC

## 2020-08-16 MED ORDER — HEPARIN SODIUM (PORCINE) 1000 UNIT/ML IJ SOLN
INTRAMUSCULAR | Status: AC
  Filled 2020-08-16: qty 1

## 2020-08-16 MED ORDER — HYDRALAZINE HCL 20 MG/ML IJ SOLN: 10.0000 mg | INTRAMUSCULAR | Status: DC | PRN

## 2020-08-16 MED ORDER — SODIUM CHLORIDE 0.9 % IV SOLN: 250.0000 mL | INTRAVENOUS | Status: DC | PRN

## 2020-08-16 MED ORDER — ACETAMINOPHEN 325 MG PO TABS: 650.0000 mg | ORAL_TABLET | ORAL | Status: DC | PRN

## 2020-08-16 MED ORDER — SODIUM CHLORIDE 0.9 % WEIGHT BASED INFUSION
3.0000 mL/kg/h | INTRAVENOUS | Status: AC
  Administered 2020-08-16: 3 mL/kg/h via INTRAVENOUS

## 2020-08-16 MED ORDER — IOHEXOL 350 MG/ML SOLN
INTRAVENOUS | Status: DC | PRN
  Administered 2020-08-16: 43 mL

## 2020-08-16 MED ORDER — FENTANYL CITRATE (PF) 100 MCG/2ML IJ SOLN
INTRAMUSCULAR | Status: DC | PRN
  Administered 2020-08-16: 50 ug via INTRAVENOUS

## 2020-08-16 MED ORDER — FENTANYL CITRATE (PF) 100 MCG/2ML IJ SOLN
INTRAMUSCULAR | Status: AC
  Filled 2020-08-16: qty 2

## 2020-08-16 MED ORDER — VERAPAMIL HCL 2.5 MG/ML IV SOLN
INTRAVENOUS | Status: DC | PRN
  Administered 2020-08-16: 10 mL via INTRA_ARTERIAL

## 2020-08-16 SURGICAL SUPPLY — 11 items
BAG SNAP BAND KOVER 36X36 (MISCELLANEOUS) ×2 IMPLANT
CATH 5FR JL3.5 JR4 ANG PIG MP (CATHETERS) ×2 IMPLANT
COVER DOME SNAP 22 D (MISCELLANEOUS) ×2 IMPLANT
DEVICE RAD COMP TR BAND LRG (VASCULAR PRODUCTS) ×2 IMPLANT
GLIDESHEATH SLEND SS 6F .021 (SHEATH) ×2 IMPLANT
GUIDEWIRE INQWIRE 1.5J.035X260 (WIRE) ×1 IMPLANT
INQWIRE 1.5J .035X260CM (WIRE) ×2
KIT HEART LEFT (KITS) ×2 IMPLANT
PACK CARDIAC CATHETERIZATION (CUSTOM PROCEDURE TRAY) ×2 IMPLANT
TRANSDUCER W/STOPCOCK (MISCELLANEOUS) ×2 IMPLANT
TUBING CIL FLEX 10 FLL-RA (TUBING) ×2 IMPLANT

## 2020-08-16 NOTE — Progress Notes (Signed)
Discharge instructions reviewed with patient and family. Verbalized understanding. 

## 2020-08-16 NOTE — Discharge Instructions (Signed)
Radial Site Care  This sheet gives you information about how to care for yourself after your procedure. Your health care provider may also give you more specific instructions. If you have problems or questions, contact your health care provider. What can I expect after the procedure? After the procedure, it is common to have:  Bruising and tenderness at the catheter insertion area. Follow these instructions at home: Medicines  Take over-the-counter and prescription medicines only as told by your health care provider. Insertion site care  Follow instructions from your health care provider about how to take care of your insertion site. Make sure you: ? Wash your hands with soap and water before you change your bandage (dressing). If soap and water are not available, use hand sanitizer. ? Change your dressing as told by your health care provider. ? Leave stitches (sutures), skin glue, or adhesive strips in place. These skin closures may need to stay in place for 2 weeks or longer. If adhesive strip edges start to loosen and curl up, you may trim the loose edges. Do not remove adhesive strips completely unless your health care provider tells you to do that.  Check your insertion site every day for signs of infection. Check for: ? Redness, swelling, or pain. ? Fluid or blood. ? Pus or a bad smell. ? Warmth.  Do not take baths, swim, or use a hot tub until your health care provider approves.  You may shower 24-48 hours after the procedure, or as directed by your health care provider. ? Remove the dressing and gently wash the site with plain soap and water. ? Pat the area dry with a clean towel. ? Do not rub the site. That could cause bleeding.  Do not apply powder or lotion to the site. Activity  For 24 hours after the procedure, or as directed by your health care provider: ? Do not flex or bend the affected arm. ? Do not push or pull heavy objects with the affected arm. ? Do not drive  yourself home from the hospital or clinic. You may drive 24 hours after the procedure unless your health care provider tells you not to. ? Do not operate machinery or power tools.  Do not lift anything that is heavier than 10 lb (4.5 kg), or the limit that you are told, until your health care provider says that it is safe.  Ask your health care provider when it is okay to: ? Return to work or school. ? Resume usual physical activities or sports. ? Resume sexual activity.   General instructions  If the catheter site starts to bleed, raise your arm and put firm pressure on the site. If the bleeding does not stop, get help right away. This is a medical emergency.  If you went home on the same day as your procedure, a responsible adult should be with you for the first 24 hours after you arrive home.  Keep all follow-up visits as told by your health care provider. This is important. Contact a health care provider if:  You have a fever.  You have redness, swelling, or yellow drainage around your insertion site. Get help right away if:  You have unusual pain at the radial site.  The catheter insertion area swells very fast.  The insertion area is bleeding, and the bleeding does not stop when you hold steady pressure on the area.  Your arm or hand becomes pale, cool, tingly, or numb. These symptoms may represent a serious   problem that is an emergency. Do not wait to see if the symptoms will go away. Get medical help right away. Call your local emergency services (911 in the [place] Do not drive yourself to the hospital. Summary  After the procedure, it is common to have bruising and tenderness at the site.  Follow instructions from your health care provider about how to take care of your radial site wound. Check the wound every day for signs of infection.  Do not lift anything that is heavier than 10 lb (4.5 kg), or the limit that you are told, until your health care provider says that it  is safe. This information is not intended to replace advice given to you by your health care provider. Make sure you discuss any questions you have with your health care provider. Document Revised: 07/25/2017 Document Reviewed: 07/25/2017 Elsevier Patient Education  2021 Elsevier Inc.  

## 2020-08-16 NOTE — Interval H&P Note (Signed)
History and Physical Interval Note:  08/16/2020 7:16 AM  Jeffery Kirby  has presented today for surgery, with the diagnosis of stable angina and abnormal cardiac CTA.  The various methods of treatment have been discussed with the patient and family. After consideration of risks, benefits and other options for treatment, the patient has consented to  Procedure(s): LEFT HEART CATH AND CORONARY ANGIOGRAPHY (N/A) as a surgical intervention.  The patient's history has been reviewed, patient examined, no change in status, stable for surgery.  I have reviewed the patient's chart and labs.  Questions were answered to the patient's satisfaction.    Cath Lab Visit (complete for each Cath Lab visit)  Clinical Evaluation Leading to the Procedure:   ACS: No.  Non-ACS:    Anginal Classification: CCS II  Anti-ischemic medical therapy: No Therapy  Non-Invasive Test Results: Intermediate-risk stress test findings: cardiac mortality 1-3%/year (significant mid LAD stenosis by CT-FFR)  Prior CABG: No previous CABG  [name]

## 2020-08-24 ENCOUNTER — Other Ambulatory Visit: Payer: Self-pay

## 2020-08-24 ENCOUNTER — Ambulatory Visit (INDEPENDENT_AMBULATORY_CARE_PROVIDER_SITE_OTHER): Payer: Medicare Other | Admitting: Internal Medicine

## 2020-08-24 ENCOUNTER — Encounter: Payer: Self-pay | Admitting: Internal Medicine

## 2020-08-24 VITALS — BP 110/72 | HR 59 | Ht 73.0 in | Wt 181.0 lb

## 2020-08-24 DIAGNOSIS — I10 Essential (primary) hypertension: Secondary | ICD-10-CM

## 2020-08-24 DIAGNOSIS — I25110 Atherosclerotic heart disease of native coronary artery with unstable angina pectoris: Secondary | ICD-10-CM | POA: Insufficient documentation

## 2020-08-24 DIAGNOSIS — I2510 Atherosclerotic heart disease of native coronary artery without angina pectoris: Secondary | ICD-10-CM | POA: Insufficient documentation

## 2020-08-24 DIAGNOSIS — I700 Atherosclerosis of aorta: Secondary | ICD-10-CM

## 2020-08-24 DIAGNOSIS — E782 Mixed hyperlipidemia: Secondary | ICD-10-CM | POA: Diagnosis not present

## 2020-08-24 MED ORDER — ROSUVASTATIN CALCIUM 40 MG PO TABS: 40.0000 mg | ORAL_TABLET | Freq: Every day | ORAL | 3 refills | Status: DC

## 2020-08-24 NOTE — Patient Instructions (Signed)
Medication Instructions:  Your physician has recommended you make the following change in your medication:   INCREASE:  Rosuvastatin (Crestor) to 40mg  by mouth daily  *If you need a refill on your cardiac medications before your next appointment, please call your pharmacy*   Lab Work: IN 3 MONTHS: lipid panel and LFT  If you have labs (blood work) drawn today and your tests are completely normal, you will receive your results only by: Marland Kitchen MyChart Message (if you have MyChart) OR . A paper copy in the mail If you have any lab test that is abnormal or we need to change your treatment, we will call you to review the results.   Testing/Procedures: NONE   Follow-Up: At [facility], you and your health needs are our priority.  As part of our continuing mission to provide you with exceptional heart care, we have created designated Provider Care Teams.  These Care Teams include your primary Cardiologist (physician) and Advanced Practice Providers (APPs -  Physician Assistants and Nurse Practitioners) who all work together to provide you with the care you need, when you need it.    Your next appointment:   6 month(s)  The format for your next appointment:   In Person  Provider:   You may see Gasper Sells, MD or one of the following Advanced Practice Providers on your designated Care Team:    Melina Copa, PA-C  Ermalinda Barrios, PA-C

## 2020-08-24 NOTE — Progress Notes (Signed)
Cardiology Office Note:    Date:  08/24/2020   ID:  Jeffery Kirby, DOB 12-14-1945, MRN [id number]  PCP:  Marton Redwood, MD  [facility] Cardiologist: Rudean Haskell MD [place] Electrophysiologist:  None   CC: Cath follow up  History of Present Illness:    Jeffery Kirby is a 75 y.o. male with a HTN and HLD who presented for evaluation 07/13/20. In interim had Cardiac CT which showed multi-vessel disease- seen 08/10/20.  In interim of this visit, patient JQB:HALPFXT non obstructive with obstructive apical disease.  Patient notes that he is doing [place].  Since last visit notes that with restarting exercise gets a little bit fatigued (walking 4-5 miles).  Notes leg pain since having the CT scan.  Notes that he has new leg pain since the CT scan; has a history of meniscus tear.  No chest pain or pressure.  No SOB/DOE and no PND/Orthopnea.  No weight gain or leg swelling.  No palpitations or syncope.  Ambulatory blood pressure at goal .   Past Medical History:  Diagnosis Date  . Angina pectoris ([place])   . Cataract   . Exertional chest pain   . Hyperlipidemia   . Hypertension   . Shingles 2012  . Tinnitus     Past Surgical History:  Procedure Laterality Date  . COLONOSCOPY    . LEFT HEART CATH AND CORONARY ANGIOGRAPHY N/A 08/16/2020   Procedure: LEFT HEART CATH AND CORONARY ANGIOGRAPHY;  Surgeon: Nelva Bush, MD;  Location: [facility];  Service: Cardiovascular;  Laterality: N/A;  . ROTATOR CUFF REPAIR Bilateral   . wisdom teeth      Current Medications: Current Meds  Medication Sig  . aspirin EC 81 MG tablet Take 1 tablet (81 mg total) by mouth daily. Swallow whole.  . escitalopram (LEXAPRO) 10 MG tablet Take 10 mg by mouth daily.  . metoprolol tartrate (LOPRESSOR) 25 MG tablet Take 1 tablet (25 mg total) by mouth 2 (two) times daily.  . naproxen (NAPROSYN) 500 MG tablet Take 500 mg by mouth 2 (two) times daily as needed for moderate pain.  .  rosuvastatin (CRESTOR) 20 MG tablet Take 1 tablet (20 mg total) by mouth daily.  . vitamin B-12 (CYANOCOBALAMIN) 1000 MCG tablet Take 1,000 mcg by mouth 2 (two) times a week.     Allergies:   Patient has no known allergies.   Social History   Socioeconomic History  . Marital status: Married    Spouse name: Not on file  . Number of children: Not on file  . Years of education: Not on file  . Highest education level: Not on file  Occupational History  . 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 Smoking status: Never Smoker  . Smokeless tobacco: Never Used  Vaping Use  . Vaping Use: Never used  Substance and Sexual Activity  . Alcohol use: Yes    Comment: occasional beer  . Drug use: Not Currently  . Sexual activity: Not on file  Other Topics Concern  . Not on file  Social History Narrative  . Not on file   Social Determinants of Health   Financial Resource Strain: Not on file  Food Insecurity: Not on file  Transportation Needs: Not on file  Physical Activity: Not on file  Stress: Not on file  Social Connections: Not on file    Social: Went to [employer], married  Family History: The patient's family history  includes COPD in his mother; Diabetes in his mother; Heart attack in his father, sister, and sister; Kidney disease in his mother. There is no history of Colon cancer, Colon polyps, Esophageal cancer, Rectal cancer, or Stomach cancer. History of coronary artery disease notable for sisters and father. History of heart failure notable for no members. History of arrhythmia notable for no members. Sister died young at the age of 85 (passed in her sleep).  ROS:   Please see the history of present illness.     All other systems reviewed and are negative.  EKGs/Labs/Other Studies Reviewed:    The following studies were reviewed today:  EKG:   08/24/20: Sinus Bradycardia 1st HB 07/13/20: SR rate 69 1st Hb with Baseline  artifact  Cardiac CT: Date 08/10/20 IMPRESSION: 1. Coronary calcium score of 1724. This was 90th percentile for age, sex, and race matched control.  2. Normal coronary origin with right dominance.  3. CAD-RADS 4 Severe stenosis. (70-99% or > 50% left main). There is left main, RCA, LAD, and LCX disease. Cardiac catheterization or CT FFR is recommended. Consider symptom-guided anti-ischemic pharmacotherapy as well as risk factor modification per guideline directed care.  4.  Left main appears aneurysmal: Ostial diameter measured at 16 mm.  5. Upper limits of normal ascending aortic size 39 mm, with ascending aortic height index 2.10 cm/m.  6.  Aortic atherosclerosis noted.  7.  Mild aortic valve calcification and mild annular calcification.   Left/Right Heart Catheterizations: Date: 08/16/20 Results: Conclusions: 1. Multivessel coronary artery disease, as detailed below.  Most severe lesion is a 95% stenosis involving the apical LAD.  There are also 50% stenoses involving the proximal LAD and small D1 branch, as well as 40% lPL1 stenosis and moderate diffuse disease involving the RCA. 2. Low normal left ventricular systolic function (LVEF 39-03%) with mildly elevated filling pressure (LVEDP ~20 mmHg).  Recommendations: 1. Optimize medical therapy; 95% apical LAD stenosis is not well-suited for PCI given distal location and vessel size.  Will add metoprolol tartrate 25 mg twice daily. 2. Aggressive secondary prevention of coronary artery disease.   Recent Labs: 08/05/2020: BUN 12; Creatinine, Ser 1.04; Potassium 4.3; Sodium 140 08/10/2020: Hemoglobin 15.1; Platelets 203  Recent Lipid Panel No results found for: CHOL, TRIG, HDL, CHOLHDL, VLDL, LDLCALC, LDLDIRECT  [facility] Labs 06/07/2020 Creatinine 1.1 K 4.8 AST/ALT 13/13 Cholesterol 138 TGs 60 HDL 47 LDL 79 ApoB 76  Risk Assessment/Calculations:     N/A  Physical Exam:    VS:  BP 110/72   Pulse (!) 59   Ht 6'  1" (1.854 m)   Wt 181 lb (82.1 kg)   SpO2 95%   BMI 23.88 kg/m     Wt Readings from Last 3 Encounters:  08/24/20 181 lb (82.1 kg)  08/16/20 183 lb (83 kg)  08/10/20 184 lb (83.5 kg)    GEN:  Well nourished, well developed in no acute distress HEENT: Normal NECK: No JVD; No carotid bruits LYMPHATICS: No lymphadenopathy CARDIAC: RRR, no murmurs, rubs, gallops Radial site with no bruits of hematoma RESPIRATORY:  Clear to auscultation without rales, wheezing or rhonchi  ABDOMEN: Soft, non-tender, non-distended MUSCULOSKELETAL:  No edema; No deformity  SKIN: Warm and dry NEUROLOGIC:  Alert and oriented x 3 PSYCHIATRIC:  Normal affect   ASSESSMENT:    1. Coronary artery disease involving native coronary artery of native heart without angina pectoris   2. Mixed hyperlipidemia   3. Aortic atherosclerosis ([place])   4. Essential hypertension  PLAN:    In order of problems listed above:  Coronary Artery Disease; Obstructive - asymptomatic - anatomy: apical disease with non obstructive LAD and D1 by cath - continue ASA 81 mg - will increase rosuvastatin to 40 mg, goal LDL < 70; discussed statin myopathy risk and standards - continue BB   Hyperlipidemia (mixed) Aortic Atherosclerosis -LDL goal less than 70 - statin as above - lipids and LFTs in three months - gave education on dietary changes  Essential Hypertension - ambulatory blood pressure at goal, will start ambulatory BP monitoring; gave education on how to perform ambulatory blood pressure monitoring including the frequency and technique; goal ambulatory blood pressure < 135/85 on average  Six months follow up unless new symptoms or abnormal test results warranting change in plan (will reschedule April visit)  Would be reasonable for  Video Visit Follow up Would be reasonable for  APP Follow up   Medication Adjustments/Labs and Tests Ordered: Current medicines are reviewed at length with the patient today.   Concerns regarding medicines are outlined above.  Orders Placed This Encounter  Procedures  . EKG 12-Lead   No orders of the defined types were placed in this encounter.   There are no Patient Instructions on file for this visit.   Signed, Werner Lean, MD  08/24/2020 11:35 AM    [place]

## 2020-09-06 DIAGNOSIS — Z961 Presence of intraocular lens: Secondary | ICD-10-CM | POA: Diagnosis not present

## 2020-10-05 ENCOUNTER — Ambulatory Visit: Payer: Medicare Other

## 2020-10-07 ENCOUNTER — Ambulatory Visit: Payer: Medicare Other | Admitting: Internal Medicine

## 2020-10-07 ENCOUNTER — Other Ambulatory Visit: Payer: Self-pay

## 2020-10-07 ENCOUNTER — Ambulatory Visit: Payer: Medicare Other | Attending: Internal Medicine

## 2020-10-07 DIAGNOSIS — Z23 Encounter for immunization: Secondary | ICD-10-CM

## 2020-10-07 NOTE — Progress Notes (Signed)
   Covid-19 Vaccination Clinic  Name:  Trini Christiansen    MRN: [id number] DOB: [birth year]  10/07/2020  Mr. Stankovich was observed post Covid-19 immunization for 15 minutes without incident. He was provided with Vaccine Information Sheet and instruction to access the V-Safe system.   Mr. Mayabb was instructed to call 911 with any severe reactions post vaccine: Marland Kitchen Difficulty breathing  . Swelling of face and throat  . A fast heartbeat  . A bad rash all over body  . Dizziness and weakness   Immunizations Administered    Name Date Dose VIS Date Route   PFIZER Comrnaty(Gray TOP) Covid-19 Vaccine 10/07/2020 10:10 AM 0.3 mL 06/10/2020 Intramuscular   Manufacturer: [place]   Lot: [id number]   [place]: [phone]

## 2020-10-14 ENCOUNTER — Other Ambulatory Visit (HOSPITAL_BASED_OUTPATIENT_CLINIC_OR_DEPARTMENT_OTHER): Payer: Self-pay

## 2020-10-14 MED ORDER — COVID-19 MRNA VACCINE (PFIZER) 30 MCG/0.3ML IM SUSP
INTRAMUSCULAR | 0 refills | Status: DC
  Filled 2020-10-14: qty 0.3, 1d supply, fill #0

## 2020-11-23 ENCOUNTER — Telehealth: Payer: Self-pay

## 2020-11-23 ENCOUNTER — Other Ambulatory Visit: Payer: Self-pay

## 2020-11-23 ENCOUNTER — Other Ambulatory Visit: Payer: Medicare Other | Admitting: *Deleted

## 2020-11-23 DIAGNOSIS — I2510 Atherosclerotic heart disease of native coronary artery without angina pectoris: Secondary | ICD-10-CM | POA: Diagnosis not present

## 2020-11-23 DIAGNOSIS — E782 Mixed hyperlipidemia: Secondary | ICD-10-CM

## 2020-11-23 DIAGNOSIS — Z006 Encounter for examination for normal comparison and control in clinical research program: Secondary | ICD-10-CM

## 2020-11-23 LAB — LIPID PANEL
Chol/HDL Ratio: 2.2 ratio (ref 0.0–5.0)
Cholesterol, Total: 116 mg/dL (ref 100–199)
HDL: 52 mg/dL (ref >39)
LDL Chol Calc (NIH): 49 mg/dL (ref 0–99)
Triglycerides: 73 mg/dL (ref 0–149)
VLDL Cholesterol Cal: 15 mg/dL (ref 5–40)

## 2020-11-23 LAB — HEPATIC FUNCTION PANEL
ALT: 34 IU/L (ref 0–44)
AST: 23 IU/L (ref 0–40)
Albumin: 4.5 g/dL (ref 3.7–4.7)
Alkaline Phosphatase: 57 IU/L (ref 44–121)
Bilirubin Total: 0.7 mg/dL (ref 0.0–1.2)
Bilirubin, Direct: 0.23 mg/dL (ref 0.00–0.40)
Total Protein: 6.4 g/dL (ref 6.0–8.5)

## 2020-11-23 NOTE — Telephone Encounter (Signed)
Called patient for 90 day Identify phone call no answer, I left a voicemail stating the intent of the phone call and our call back number to be reached in our department. 

## 2020-12-07 DIAGNOSIS — I209 Angina pectoris, unspecified: Secondary | ICD-10-CM | POA: Diagnosis not present

## 2020-12-07 DIAGNOSIS — F320 Major depressive disorder, single episode, mild: Secondary | ICD-10-CM | POA: Diagnosis not present

## 2020-12-07 DIAGNOSIS — E785 Hyperlipidemia, unspecified: Secondary | ICD-10-CM | POA: Diagnosis not present

## 2020-12-07 DIAGNOSIS — G3184 Mild cognitive impairment, so stated: Secondary | ICD-10-CM | POA: Diagnosis not present

## 2020-12-07 DIAGNOSIS — G250 Essential tremor: Secondary | ICD-10-CM | POA: Diagnosis not present

## 2020-12-07 DIAGNOSIS — R269 Unspecified abnormalities of gait and mobility: Secondary | ICD-10-CM | POA: Diagnosis not present

## 2020-12-07 DIAGNOSIS — I2510 Atherosclerotic heart disease of native coronary artery without angina pectoris: Secondary | ICD-10-CM | POA: Diagnosis not present

## 2020-12-07 DIAGNOSIS — N402 Nodular prostate without lower urinary tract symptoms: Secondary | ICD-10-CM | POA: Diagnosis not present

## 2020-12-07 DIAGNOSIS — K5909 Other constipation: Secondary | ICD-10-CM | POA: Diagnosis not present

## 2020-12-07 DIAGNOSIS — R7301 Impaired fasting glucose: Secondary | ICD-10-CM | POA: Diagnosis not present

## 2020-12-07 DIAGNOSIS — I10 Essential (primary) hypertension: Secondary | ICD-10-CM | POA: Diagnosis not present

## 2020-12-07 DIAGNOSIS — A6000 Herpesviral infection of urogenital system, unspecified: Secondary | ICD-10-CM | POA: Diagnosis not present

## 2020-12-09 ENCOUNTER — Other Ambulatory Visit: Payer: Self-pay | Admitting: Internal Medicine

## 2020-12-09 NOTE — Telephone Encounter (Signed)
Refill Request.  

## 2020-12-15 DIAGNOSIS — N402 Nodular prostate without lower urinary tract symptoms: Secondary | ICD-10-CM | POA: Diagnosis not present

## 2020-12-22 DIAGNOSIS — N402 Nodular prostate without lower urinary tract symptoms: Secondary | ICD-10-CM | POA: Diagnosis not present

## 2021-02-27 NOTE — Progress Notes (Signed)
Cardiology Office Note:    Date:  02/28/2021   ID:  Jeffery Kirby, DOB April 21, 1946, MRN [id number]  PCP:  Jeffery Kirby., MD  [facility] Cardiologist: Rudean Haskell MD [facility] Electrophysiologist:  None   CC: CAD f/u  History of Present Illness:    Jeffery Kirby is a 75 y.o. male with a HTN and HLD who presented for evaluation 07/13/20. In interim had Cardiac CT which showed multi-vessel disease- seen 08/10/20.  In interim of this visit, patient [id number] non obstructive disease save for with obstructive apical disease. Seen 02/27/21.  Patient notes that he is feeling some light headedness.  Since last visit notes more fatigue and light headiness.  Relevant interval testing or therapy include metoprolol tartrate.  There are no interval hospital/ED visit.    No chest pain or pressure .  No SOB/DOE and no PND/Orthopnea.  No weight gain or leg swelling.  No palpitations or syncope.   Past Medical History:  Diagnosis Date   Angina pectoris ([place])    Cataract    Exertional chest pain    Hyperlipidemia    Hypertension    Shingles 2012   Tinnitus     Past Surgical History:  Procedure Laterality Date   COLONOSCOPY     LEFT HEART CATH AND CORONARY ANGIOGRAPHY N/A 08/16/2020   Procedure: LEFT HEART CATH AND CORONARY ANGIOGRAPHY;  Surgeon: Nelva Bush, MD;  Location: [place] [facility];  Service: Cardiovascular;  Laterality: N/A;   ROTATOR CUFF REPAIR Bilateral    wisdom teeth      Current Medications: Current Meds  Medication Sig   aspirin EC 81 MG tablet Take 1 tablet (81 mg total) by mouth daily. Swallow whole.   escitalopram (LEXAPRO) 10 MG tablet Take 10 mg by mouth daily.   metoprolol tartrate (LOPRESSOR) 25 MG tablet TAKE ONE TABLET BY MOUTH TWICE A DAY   naproxen (NAPROSYN) 500 MG tablet Take 500 mg by mouth 2 (two) times daily as needed for moderate pain.   rosuvastatin (CRESTOR) 40 MG tablet Take 1 tablet (40 mg total) by mouth daily.    vitamin B-12 (CYANOCOBALAMIN) 1000 MCG tablet Take 1,000 mcg by mouth 2 (two) times a week.     Allergies:   Patient has no known allergies.   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  Tobacco Use   Smoking status: Never   Smokeless tobacco: Never  Vaping Use   Vaping Use: Never used  Substance and Sexual Activity   Alcohol use: Yes    Comment: occasional beer   Drug use: Not Currently   Sexual activity: Not on file  Other Topics Concern   Not on file  Social History Narrative   Not on file   Social Determinants of Health   Financial Resource Strain: Not on file  Food Insecurity: Not on file  Transportation Needs: Not on file  Physical Activity: Not on file  Stress: Not on file  Social Connections: Not on file    Social: Went to [employer], married; retired; went to [place] in 2022  Family History: The patient's family history includes COPD in his mother; Diabetes in his mother; Heart attack in his father,  sister, and sister; Kidney disease in his mother. There is no history of Colon cancer, Colon polyps, Esophageal cancer, Rectal cancer, or Stomach cancer. History of coronary artery disease notable for sisters and father. History of heart failure notable for no members. History of arrhythmia notable for no members. Sister died young at the age of 33 (passed in her sleep).  ROS:   Please see the history of present illness.     All other systems reviewed and are negative.  EKGs/Labs/Other Studies Reviewed:    The following studies were reviewed today:  EKG:   08/24/20: Sinus Bradycardia 1st HB 07/13/20: SR rate 69 1st Hb with Baseline artifact  Cardiac CT: Date 08/10/20 IMPRESSION: 1. Coronary calcium  score of 1724. This was 90th percentile for age, sex, and race matched control.   2. Normal coronary origin with right dominance.   3. CAD-RADS 4 Severe stenosis. (70-99% or > 50% left main). There is left main, RCA, LAD, and LCX disease. Cardiac catheterization or CT FFR is recommended. Consider symptom-guided anti-ischemic pharmacotherapy as well as risk factor modification per guideline directed care.   4.  Left main appears aneurysmal: Ostial diameter measured at 16 mm.   5. Upper limits of normal ascending aortic size 39 mm, with ascending aortic height index 2.10 cm/m.   6.  Aortic atherosclerosis noted.   7.  Mild aortic valve calcification and mild annular calcification.    Left/Right Heart Catheterizations: Date: 08/16/20 Results: Conclusions: Multivessel coronary artery disease, as detailed below.  Most severe lesion is a 95% stenosis involving the apical LAD.  There are also 50% stenoses involving the proximal LAD and small D1 branch, as well as 40% lPL1 stenosis and moderate diffuse disease involving the RCA. Low normal left ventricular systolic function (LVEF 99991111) with mildly elevated filling pressure (LVEDP ~20 mmHg).   Recommendations: Optimize medical therapy; 95% apical LAD stenosis is not well-suited for PCI given distal location and vessel size.  Will add metoprolol tartrate 25 mg twice daily. Aggressive secondary prevention of coronary artery disease.   Recent Labs: 08/05/2020: BUN 12; Creatinine, Ser 1.04; Potassium 4.3; Sodium 140 08/10/2020: Hemoglobin 15.1; Platelets 203 11/23/2020: ALT 34  Recent Lipid Panel    Component Value Date/Time   CHOL 116 11/23/2020 0811   TRIG 73 11/23/2020 0811   HDL 52 11/23/2020 0811   CHOLHDL 2.2 11/23/2020 0811   LDLCALC 49 11/23/2020 0811    OSH Labs 06/07/2020 Creatinine 1.1 K 4.8 AST/ALT 13/13 Cholesterol 138 TGs 60 HDL 47 LDL 79 ApoB 76   Physical Exam:    VS:  BP 110/68   Pulse 72   Ht '6\' 1"'$   (1.854 m)   Wt 182 lb (82.6 kg)   SpO2 96%   BMI 24.01 kg/m     Wt Readings from Last 3 Encounters:  02/28/21 182 lb (82.6 kg)  08/24/20 181 lb (82.1 kg)  08/16/20 183 lb (83 kg)    GEN:  Well nourished, well developed in no acute distress HEENT: Normal NECK: No JVD LYMPHATICS: No lymphadenopathy CARDIAC: RRR, no murmurs, rubs, gallops RESPIRATORY:  Clear to auscultation without rales, wheezing or rhonchi  ABDOMEN: Soft, non-tender, non-distended MUSCULOSKELETAL:  No edema; No deformity  SKIN: Warm and dry NEUROLOGIC:  Alert and oriented x 3 PSYCHIATRIC:  Normal affect   ASSESSMENT:    1. Coronary artery disease involving native coronary artery of native heart without angina pectoris   2. Aortic atherosclerosis ([place])   3. Essential hypertension  PLAN:    In order of problems listed above:  Coronary Artery Disease; Obstructive - asymptomatic - anatomy: apical disease with non obstructive LAD and D1 by cath - continue ASA 81 mg - on rosuvastatin to 40 mg, goal LDL < 70;  - BB will cut 12.5 mg PO BIDI  Hyperlipidemia (mixed) Aortic Atherosclerosis -LDL goal less than 70 - statin as above - gave education on dietary changes  Essential Hypertension - ambulatory blood pressure at goal  Six months follow up unless new symptoms or abnormal test results warranting change in plan   Would be reasonable for  APP Follow up     Medication Adjustments/Labs and Tests Ordered: Current medicines are reviewed at length with the patient today.  Concerns regarding medicines are outlined above.  No orders of the defined types were placed in this encounter.  No orders of the defined types were placed in this encounter.   There are no Patient Instructions on file for this visit.   Signed, Werner Lean, MD  02/28/2021 9:43 AM    [facility]

## 2021-02-28 ENCOUNTER — Ambulatory Visit (INDEPENDENT_AMBULATORY_CARE_PROVIDER_SITE_OTHER): Payer: Medicare Other | Admitting: Internal Medicine

## 2021-02-28 ENCOUNTER — Other Ambulatory Visit: Payer: Self-pay

## 2021-02-28 ENCOUNTER — Encounter: Payer: Self-pay | Admitting: Internal Medicine

## 2021-02-28 VITALS — BP 110/68 | HR 72 | Ht 73.0 in | Wt 182.0 lb

## 2021-02-28 DIAGNOSIS — I10 Essential (primary) hypertension: Secondary | ICD-10-CM | POA: Diagnosis not present

## 2021-02-28 DIAGNOSIS — I700 Atherosclerosis of aorta: Secondary | ICD-10-CM

## 2021-02-28 DIAGNOSIS — I2510 Atherosclerotic heart disease of native coronary artery without angina pectoris: Secondary | ICD-10-CM

## 2021-02-28 MED ORDER — METOPROLOL TARTRATE 25 MG PO TABS: 12.5000 mg | ORAL_TABLET | Freq: Two times a day (BID) | ORAL | 3 refills | Status: DC

## 2021-02-28 NOTE — Patient Instructions (Signed)
Medication Instructions:  Your physician has recommended you make the following change in your medication: DECREASE: metoprolol tartrate (Lopressor) to 12.5 mg by mouth twice daily  *If you need a refill on your cardiac medications before your next appointment, please call your pharmacy*   Lab Work: NONE If you have labs (blood work) drawn today and your tests are completely normal, you will receive your results only by: [place] (if you have MyChart) OR A paper copy in the mail If you have any lab test that is abnormal or we need to change your treatment, we will call you to review the results.   Testing/Procedures: NONE   Follow-Up: At [facility], you and your health needs are our priority.  As part of our continuing mission to provide you with exceptional heart care, we have created designated Provider Care Teams.  These Care Teams include your primary Cardiologist (physician) and Advanced Practice Providers (APPs -  Physician Assistants and Nurse Practitioners) who all work together to provide you with the care you need, when you need it.  We recommend signing up for the patient portal called "MyChart".  Sign up information is provided on this After Visit Summary.  MyChart is used to connect with patients for Virtual Visits (Telemedicine).  Patients are able to view lab/test results, encounter notes, upcoming appointments, etc.  Non-urgent messages can be sent to your provider as well.   To learn more about what you can do with MyChart, go to [email or web address].    Your next appointment:   6 month(s)  The format for your next appointment:   In Person  Provider:   You may see Rudean Haskell, MD or one of the following Advanced Practice Providers on your designated Care Team:   Melina Copa, PA-C Ermalinda Barrios, PA-C

## 2021-04-01 ENCOUNTER — Ambulatory Visit: Payer: Medicare Other | Attending: Internal Medicine

## 2021-04-01 ENCOUNTER — Other Ambulatory Visit (HOSPITAL_BASED_OUTPATIENT_CLINIC_OR_DEPARTMENT_OTHER): Payer: Self-pay

## 2021-04-01 DIAGNOSIS — Z23 Encounter for immunization: Secondary | ICD-10-CM

## 2021-04-01 MED ORDER — PFIZER COVID-19 VAC BIVALENT 30 MCG/0.3ML IM SUSP
INTRAMUSCULAR | 0 refills | Status: DC
  Filled 2021-04-01: qty 0.3, 1d supply, fill #0

## 2021-04-01 NOTE — Progress Notes (Signed)
   Covid-19 Vaccination Clinic  Name:  Mussa Groesbeck    MRN: [id number] DOB: 14-Jul-1945  04/01/2021  Mr. Blucher was observed post Covid-19 immunization for 15 minutes without incident. He was provided with Vaccine Information Sheet and instruction to access the V-Safe system.   Mr. Ryland was instructed to call 911 with any severe reactions post vaccine: Difficulty breathing  Swelling of face and throat  A fast heartbeat  A bad rash all over body  Dizziness and weakness

## 2021-04-11 DIAGNOSIS — Z85828 Personal history of other malignant neoplasm of skin: Secondary | ICD-10-CM | POA: Diagnosis not present

## 2021-04-11 DIAGNOSIS — L02211 Cutaneous abscess of abdominal wall: Secondary | ICD-10-CM | POA: Diagnosis not present

## 2021-04-11 DIAGNOSIS — L089 Local infection of the skin and subcutaneous tissue, unspecified: Secondary | ICD-10-CM | POA: Diagnosis not present

## 2021-04-11 DIAGNOSIS — L03311 Cellulitis of abdominal wall: Secondary | ICD-10-CM | POA: Diagnosis not present

## 2021-04-11 DIAGNOSIS — L720 Epidermal cyst: Secondary | ICD-10-CM | POA: Diagnosis not present

## 2021-07-14 DIAGNOSIS — Z125 Encounter for screening for malignant neoplasm of prostate: Secondary | ICD-10-CM | POA: Diagnosis not present

## 2021-07-14 DIAGNOSIS — E785 Hyperlipidemia, unspecified: Secondary | ICD-10-CM | POA: Diagnosis not present

## 2021-07-14 DIAGNOSIS — I10 Essential (primary) hypertension: Secondary | ICD-10-CM | POA: Diagnosis not present

## 2021-07-14 DIAGNOSIS — R7301 Impaired fasting glucose: Secondary | ICD-10-CM | POA: Diagnosis not present

## 2021-07-21 DIAGNOSIS — I2510 Atherosclerotic heart disease of native coronary artery without angina pectoris: Secondary | ICD-10-CM | POA: Diagnosis not present

## 2021-07-21 DIAGNOSIS — I10 Essential (primary) hypertension: Secondary | ICD-10-CM | POA: Diagnosis not present

## 2021-07-21 DIAGNOSIS — G250 Essential tremor: Secondary | ICD-10-CM | POA: Diagnosis not present

## 2021-07-21 DIAGNOSIS — Z0000 Encounter for general adult medical examination without abnormal findings: Secondary | ICD-10-CM | POA: Diagnosis not present

## 2021-07-21 DIAGNOSIS — R82998 Other abnormal findings in urine: Secondary | ICD-10-CM | POA: Diagnosis not present

## 2021-07-21 DIAGNOSIS — N402 Nodular prostate without lower urinary tract symptoms: Secondary | ICD-10-CM | POA: Diagnosis not present

## 2021-07-21 DIAGNOSIS — G20 Parkinson's disease: Secondary | ICD-10-CM | POA: Diagnosis not present

## 2021-07-21 DIAGNOSIS — G3184 Mild cognitive impairment, so stated: Secondary | ICD-10-CM | POA: Diagnosis not present

## 2021-07-21 DIAGNOSIS — R269 Unspecified abnormalities of gait and mobility: Secondary | ICD-10-CM | POA: Diagnosis not present

## 2021-07-21 DIAGNOSIS — Z1389 Encounter for screening for other disorder: Secondary | ICD-10-CM | POA: Diagnosis not present

## 2021-07-21 DIAGNOSIS — R7301 Impaired fasting glucose: Secondary | ICD-10-CM | POA: Diagnosis not present

## 2021-07-21 DIAGNOSIS — E785 Hyperlipidemia, unspecified: Secondary | ICD-10-CM | POA: Diagnosis not present

## 2021-07-21 DIAGNOSIS — Z1331 Encounter for screening for depression: Secondary | ICD-10-CM | POA: Diagnosis not present

## 2021-07-21 DIAGNOSIS — I209 Angina pectoris, unspecified: Secondary | ICD-10-CM | POA: Diagnosis not present

## 2021-07-21 DIAGNOSIS — F320 Major depressive disorder, single episode, mild: Secondary | ICD-10-CM | POA: Diagnosis not present

## 2021-08-05 NOTE — Progress Notes (Signed)
Assessment/Plan:    Tremor Tremor remains most consistent with essential tremor.  It has progressed since when I saw him in 2018.  It still is overall fairly mild, but I can also feel some inner tremor now. Patient and I discussed medication.  He is really not interested in further medication. I did not note any cogwheel rigidity today.  Was noted on primary care examination. Patient did have some decreased arm swing.  Because of tremor progression and this, I told him that we would just follow him, perhaps once a year.  He was agreeable to that.  Patient was a little bit overwhelmed with seeing primary care more frequently, as well as seeing his cardiologist and eye doctor, so did not want to overwhelm him with even more visits. Memory change Cognitively, he did appear pretty normal today, but had several memory complaints, including memory change, illusions and visual distortions. We will schedule neurocognitive testing. We will do MRI of the brain. Fatigue with vision change Suspect the new fatigue is probably due to the beta-blocker We will check acetylcholine receptor antibodies.  He has just a little bit of left ptosis, but suspect that is due to his cataract surgery that he had difficulty with. If above is negative, he will see me back in 1 year.  Encouraged him to see me back earlier should new neurologic issues arise.   Subjective:   Jeffery Kirby was seen today in the movement disorders clinic for neurologic consultation at the request of Brigitte Pulse Emily Filbert., MD.  The consultation is for the evaluation of possible Parkinson's disease.  Patient was seen back in 2018 for tremor.  At that point in time, patient did not meet criteria for Parkinson's disease.  On examination in 2018, he had no rest tremor, mild postural tremor that did not increase with intention.  Patient is referred back because of the development of decreased arm swing, gait change, cogwheel rigidity and  questionable hallucinations per primary care records.  MMSE done in primary care office was 30/30.    Tremor: Yes.     At rest or with activation?  activation  When is it noted the most?  Washing dishes; worse after exertion (weight lifting at gym); seem to be intermittent  Located where?  Bilaterally - notes more in the R hand b/c he is R hand dominant  Affected by caffeine:  doesn't drink enough to know (drinks 1 cup/day)  Affected by alcohol:  doesn't drink enough to know  Affected by stress:  maybe a little but feels he is pretty relaxed and not too stressed  Affected by fatigue:  No. but he is fatigued all of the time (wonders if some is due to medication)  -also c/o jerking independent of tremor.   Other Specific Symptoms:  Voice: no change but "I do talk until I am out of breath." Sleep:  Act out dreams:  one time only fell out of the bed (states started with the addition of the 3 heart meds - lopressor/lexapro/crestor) but vivid dreams started with these meds Postural symptoms:  Yes.  , pt attributes this to his new heart meds  Falls?  No. Bradykinesia symptoms: difficulty getting out of a chair Loss of smell:  Yes.   Loss of taste:  No. Difficulty Swallowing:  No. But admits to "phlegm buildup" Handwriting, micrographia: No. But its "scribbly" and some due to vision issues after cataract sx Depression:  No. Memory changes:  Yes.  , wife notes "  short term memory" issues; some decreased focus/concentration Hallucinations:  No.  visual distortions: Yes.   And has some illusions as well. N/V:  No. Lightheaded:  Yes.   But much better with increased hydration suggested by Ginger Organ., MD  Syncope: No. Diplopia:  Yes.   But had a "bad" cataract surgery   Neuroimaging of the brain has not previously been performed.   PREVIOUS MEDICATIONS: none to date  ALLERGIES:  No Known Allergies  CURRENT MEDICATIONS:  Current Outpatient Medications  Medication Instructions    aspirin EC 81 mg, Oral, Daily, Swallow whole.   COVID-19 mRNA bivalent vaccine, Pfizer, (PFIZER COVID-19 VAC BIVALENT) injection Intramuscular   escitalopram (LEXAPRO) 10 mg, Oral, Daily   metoprolol tartrate (LOPRESSOR) 12.5 mg, Oral, 2 times daily   naproxen (NAPROSYN) 500 mg, Oral, As needed   rosuvastatin (CRESTOR) 40 mg, Oral, Daily   vitamin B-12 (CYANOCOBALAMIN) 1,000 mcg, Oral, 2 times weekly    Objective:   PHYSICAL EXAMINATION:    VITALS:   Vitals:   08/09/21 0951  BP: 105/64  Pulse: 66  SpO2: 98%  Weight: 191 lb 3.2 oz (86.7 kg)  Height: 6\' 2"  (1.88 m)    GEN:  The patient appears stated age and is in NAD. HEENT:  Normocephalic, atraumatic.  The mucous membranes are moist. The superficial temporal arteries are without ropiness or tenderness. CV:  RRR Lungs:  CTAB Neck/HEME:  There are no carotid bruits bilaterally.  Neurological examination:  Orientation: The patient is alert and oriented x3.  Cranial nerves: There is good facial symmetry with the exception of some left ptosis/pseudoptosis.  Extraocular muscles are intact. The visual fields are full to confrontational testing. The speech is fluent and clear. Soft palate rises symmetrically and there is no tongue deviation. Hearing is intact to conversational tone. Sensation: Sensation is intact to light touch throughout (facial, trunk, extremities). Vibration is intact at the bilateral big toe. There is no extinction with double simultaneous stimulation.  Motor: Strength is 5/5 in the bilateral upper and lower extremities.   Shoulder shrug is equal and symmetric.  There is no pronator drift. Deep tendon reflexes: Deep tendon reflexes are 2/4 at the bilateral biceps, triceps, brachioradialis, 2+-3/4patella and achilles. Plantar responses are downgoing bilaterally.  Movement examination: Tone: There is nl tone in the bilateral upper extremities.  The tone in the lower extremities is nl.  Abnormal movements: no rest  tremor (but I can feel internal tremor); he has postural and intention tremor, L>R.  No significant trouble pouring water from 1 glass to another.  Minimal trouble with Archimedes spirals on the left. Coordination:  There is no decremation with RAM's, with any form of RAMS, including alternating supination and pronation of the forearm, hand opening and closing, finger taps, heel taps and toe taps. Gait and Station: The patient has no difficulty arising out of a deep-seated chair without the use of the hands. The patient's stride length is good, but he has slight decreased arm swing on the right (even though tremor is worse on the left).   I have reviewed and interpreted the following labs independently   Chemistry      Component Value Date/Time   NA 140 08/05/2020 1121   K 4.3 08/05/2020 1121   CL 103 08/05/2020 1121   CO2 24 08/05/2020 1121   BUN 12 08/05/2020 1121   CREATININE 1.04 08/05/2020 1121      Component Value Date/Time   CALCIUM 9.2 08/05/2020 1121  ALKPHOS 57 11/23/2020 0811   AST 23 11/23/2020 0811   ALT 34 11/23/2020 0811   BILITOT 0.7 11/23/2020 0811      No results found for: TSH Lab Results  Component Value Date   WBC 8.7 08/10/2020   HGB 15.1 08/10/2020   HCT 43.5 08/10/2020   MCV 88 08/10/2020   PLT 203 08/10/2020      Total time spent on today's visit was 60 minutes, including both face-to-face time and nonface-to-face time.  Time included that spent on review of records (prior notes available to me/labs/imaging if pertinent), discussing treatment and goals, answering patient's questions and coordinating care.  Cc:  Ginger Organ., MD

## 2021-08-09 ENCOUNTER — Ambulatory Visit (INDEPENDENT_AMBULATORY_CARE_PROVIDER_SITE_OTHER): Payer: Medicare Other | Admitting: Neurology

## 2021-08-09 ENCOUNTER — Encounter: Payer: Self-pay | Admitting: Neurology

## 2021-08-09 ENCOUNTER — Other Ambulatory Visit (INDEPENDENT_AMBULATORY_CARE_PROVIDER_SITE_OTHER): Payer: Medicare Other

## 2021-08-09 ENCOUNTER — Other Ambulatory Visit: Payer: Self-pay

## 2021-08-09 VITALS — BP 105/64 | HR 66 | Ht 74.0 in | Wt 191.2 lb

## 2021-08-09 DIAGNOSIS — H02402 Unspecified ptosis of left eyelid: Secondary | ICD-10-CM

## 2021-08-09 DIAGNOSIS — H532 Diplopia: Secondary | ICD-10-CM

## 2021-08-09 DIAGNOSIS — R5383 Other fatigue: Secondary | ICD-10-CM | POA: Diagnosis not present

## 2021-08-09 DIAGNOSIS — R413 Other amnesia: Secondary | ICD-10-CM

## 2021-08-09 DIAGNOSIS — R251 Tremor, unspecified: Secondary | ICD-10-CM | POA: Diagnosis not present

## 2021-08-09 DIAGNOSIS — E538 Deficiency of other specified B group vitamins: Secondary | ICD-10-CM | POA: Diagnosis not present

## 2021-08-09 LAB — VITAMIN B12: Vitamin B-12: 354 pg/mL (ref 211–911)

## 2021-08-09 NOTE — Patient Instructions (Signed)
A referral to [facility] has been placed for your MRI someone will contact you directly to schedule your appt. They are located at [place]. Please contact them directly by calling [phone] with any questions regarding your referral.   Your provider has requested that you have labwork completed today. The lab is located on the Second floor at [place], within the [facility] office. When you get off the elevator, turn right and go in the [facility] [address]; the first brown door on the left.  Tell the ladies behind the desk that you are there for lab work. If you are not called within 15 minutes please check with the front desk.   Once you complete your labs you are free to go. You will receive a call or message via MyChart with your lab results.     You have been referred for a neurocognitive evaluation (i.e., evaluation of memory and thinking abilities). Please bring someone with you to this appointment if possible, as it is helpful for the neuropsychologist to hear from both you and another adult who knows you well. Please bring eyeglasses and hearing aids if you wear them and take any medications as you normally would.    The evaluation will take approximately 2-3 hours and has two parts:   The first part is a clinical interview with the neuropsychologist, Dr. Melvyn Novas.  During the interview, the neuropsychologist will speak with you and the individual you brought to the appointment.    The second part of the evaluation is testing with the doctor's technician, aka psychometrician, Hinton Dyer or [employer]. During the testing, the technician will ask you to remember different types of material, solve problems, and answer some questionnaires. Your family member will not be present for this portion of the evaluation.   Please note: We have to reserve several hours of the neuropsychologist's time and the psychometrician's time for your evaluation appointment. As such,  there is a No-Show fee of $100. If you are unable to attend any of your appointments, please contact our office as soon as possible to reschedule.

## 2021-08-17 DIAGNOSIS — D225 Melanocytic nevi of trunk: Secondary | ICD-10-CM | POA: Diagnosis not present

## 2021-08-17 DIAGNOSIS — L570 Actinic keratosis: Secondary | ICD-10-CM | POA: Diagnosis not present

## 2021-08-17 DIAGNOSIS — D2271 Melanocytic nevi of right lower limb, including hip: Secondary | ICD-10-CM | POA: Diagnosis not present

## 2021-08-17 DIAGNOSIS — D2262 Melanocytic nevi of left upper limb, including shoulder: Secondary | ICD-10-CM | POA: Diagnosis not present

## 2021-08-17 DIAGNOSIS — L821 Other seborrheic keratosis: Secondary | ICD-10-CM | POA: Diagnosis not present

## 2021-08-17 DIAGNOSIS — L819 Disorder of pigmentation, unspecified: Secondary | ICD-10-CM | POA: Diagnosis not present

## 2021-08-17 DIAGNOSIS — D485 Neoplasm of uncertain behavior of skin: Secondary | ICD-10-CM | POA: Diagnosis not present

## 2021-08-17 DIAGNOSIS — D0359 Melanoma in situ of other part of trunk: Secondary | ICD-10-CM | POA: Diagnosis not present

## 2021-08-17 DIAGNOSIS — Z85828 Personal history of other malignant neoplasm of skin: Secondary | ICD-10-CM | POA: Diagnosis not present

## 2021-08-17 DIAGNOSIS — D0439 Carcinoma in situ of skin of other parts of face: Secondary | ICD-10-CM | POA: Diagnosis not present

## 2021-08-17 DIAGNOSIS — D2272 Melanocytic nevi of left lower limb, including hip: Secondary | ICD-10-CM | POA: Diagnosis not present

## 2021-08-17 DIAGNOSIS — L918 Other hypertrophic disorders of the skin: Secondary | ICD-10-CM | POA: Diagnosis not present

## 2021-08-24 DIAGNOSIS — D0439 Carcinoma in situ of skin of other parts of face: Secondary | ICD-10-CM | POA: Diagnosis not present

## 2021-08-24 DIAGNOSIS — Z85828 Personal history of other malignant neoplasm of skin: Secondary | ICD-10-CM | POA: Diagnosis not present

## 2021-08-24 LAB — MYASTHENIA GRAVIS PANEL 1
A CHR BINDING ABS: <0.3 nmol/L
STRIATED MUSCLE AB SCREEN: NEGATIVE

## 2021-09-01 DIAGNOSIS — D0359 Melanoma in situ of other part of trunk: Secondary | ICD-10-CM | POA: Diagnosis not present

## 2021-09-01 DIAGNOSIS — Z85828 Personal history of other malignant neoplasm of skin: Secondary | ICD-10-CM | POA: Diagnosis not present

## 2021-09-01 DIAGNOSIS — L988 Other specified disorders of the skin and subcutaneous tissue: Secondary | ICD-10-CM | POA: Diagnosis not present

## 2021-09-05 ENCOUNTER — Encounter: Payer: Self-pay | Admitting: Internal Medicine

## 2021-09-05 NOTE — Progress Notes (Signed)
Cardiology Office Note:    Date:  09/08/2021   ID:  Jeffery Kirby, DOB April 13, 1946, MRN [id number]  PCP:  Ginger Organ., MD  [facility] Cardiologist: Rudean Haskell MD [facility] Electrophysiologist:  None   CC: CAD f/u  History of Present Illness:    Jeffery Kirby is a 76 y.o. male with a HTN and HLD who presented for evaluation 07/13/20. In interim had Cardiac CT which showed multi-vessel disease- seen 08/10/20.  In interim of this visit, patient TDD:UKGURKY non obstructive disease save for with obstructive apical disease. With medical therapy, lightheaded on BB.  Seen in 2023.  Patient notes that he is doing well.   Still has some fatigue, but otherwise dose well There are no interval hospital/ED visit.    Very rare chest pain.  No SOB/DOE and no PND/Orthopnea.  No weight gain or leg swelling.  No palpitations or syncope.   Past Medical History:  Diagnosis Date   Angina pectoris ([place])    Cataract    Exertional chest pain    Hyperlipidemia    Hypertension    Shingles 2012   Tinnitus     Past Surgical History:  Procedure Laterality Date   COLONOSCOPY     LEFT HEART CATH AND CORONARY ANGIOGRAPHY N/A 08/16/2020   Procedure: LEFT HEART CATH AND CORONARY ANGIOGRAPHY;  Surgeon: Nelva Bush, MD;  Location: [place] [facility];  Service: Cardiovascular;  Laterality: N/A;   ROTATOR CUFF REPAIR Bilateral    wisdom teeth      Current Medications: Current Meds  Medication Sig   aspirin EC 81 MG tablet Take 1 tablet (81 mg total) by mouth daily. Swallow whole.   escitalopram (LEXAPRO) 10 MG tablet Take 10 mg by mouth daily.   metoprolol tartrate (LOPRESSOR) 25 MG tablet Take 0.5 tablets (12.5 mg total) by mouth 2 (two) times daily.   mupirocin ointment (BACTROBAN) 2 % SMARTSIG:1 Application Topical 2-3 Times Daily   naproxen (NAPROSYN) 500 MG tablet Take 500 mg by mouth as needed.   vitamin B-12 (CYANOCOBALAMIN) 1000 MCG tablet Take 1,000 mcg by mouth 2  (two) times a week.   [DISCONTINUED] COVID-19 mRNA bivalent vaccine, Pfizer, (PFIZER COVID-19 VAC BIVALENT) injection Inject into the muscle.   [DISCONTINUED] rosuvastatin (CRESTOR) 40 MG tablet Take 1 tablet (40 mg total) by mouth daily.     Allergies:   Patient has no known allergies.   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  Tobacco Use   Smoking status: Never   Smokeless tobacco: Never  Vaping Use   Vaping Use: Never used  Substance and Sexual Activity   Alcohol use: Yes    Comment: occasional beer   Drug use: Not Currently   Sexual activity: Not on file  Other Topics Concern   Not on file  Social History Narrative   Right handed   Lives with wife    Retired   Investment banker, operational of Radio broadcast assistant Strain: Not on file  Food Insecurity: Not on file  Transportation Needs: Not on file  Physical Activity: Not on file  Stress: Not on file  Social Connections: Not on file    Social: Went to  [employer], married; retired; went to [place] in 2022, going to [place] in 2023  Family History: The patient's family history includes COPD in his mother; Diabetes in his mother; Heart attack in his father, sister, and sister; Kidney disease in his mother. There is no history of Colon cancer, Colon polyps, Esophageal cancer, Rectal cancer, or Stomach cancer. History of coronary artery disease notable for sisters and father. History of heart failure notable for no members. History of arrhythmia notable for no members. Sister died young at the age of 58 (passed in her sleep). Brother has new ICD  ROS:   Please see the history of present illness.     All other systems reviewed and are  negative.  EKGs/Labs/Other Studies Reviewed:    The following studies were reviewed today:  EKG:   3/923: SR rate 73 08/24/20: Sinus Bradycardia 1st HB 07/13/20: SR rate 69 1st Hb with Baseline artifact  Cardiac CT: Date 08/10/20 IMPRESSION: 1. Coronary calcium score of 1724. This was 90th percentile for age, sex, and race matched control.   2. Normal coronary origin with right dominance.   3. CAD-RADS 4 Severe stenosis. (70-99% or > 50% left main). There is left main, RCA, LAD, and LCX disease. Cardiac catheterization or CT FFR is recommended. Consider symptom-guided anti-ischemic pharmacotherapy as well as risk factor modification per guideline directed care.   4.  Left main appears aneurysmal: Ostial diameter measured at 16 mm.   5. Upper limits of normal ascending aortic size 39 mm, with ascending aortic height index 2.10 cm/m.   6.  Aortic atherosclerosis noted.   7.  Mild aortic valve calcification and mild annular calcification.    Left/Right Heart Catheterizations: Date: 08/16/20 Results: Conclusions: Multivessel coronary artery disease, as detailed below.  Most severe lesion is a 95% stenosis involving the apical LAD.  There are also 50% stenoses involving the proximal LAD and small D1 branch, as well as 40% lPL1 stenosis and moderate diffuse disease involving the RCA. Low normal left ventricular systolic function (LVEF 62-69%) with mildly elevated filling pressure (LVEDP ~20 mmHg).   Recommendations: Optimize medical therapy; 95% apical LAD stenosis is not well-suited for PCI given distal location and vessel size.  Will add metoprolol tartrate 25 mg twice daily. Aggressive secondary prevention of coronary artery disease.   Recent Labs: 11/23/2020: ALT 34  Recent Lipid Panel    Component Value Date/Time   CHOL 116 11/23/2020 0811   TRIG 73 11/23/2020 0811   HDL 52 11/23/2020 0811   CHOLHDL 2.2 11/23/2020 0811   LDLCALC 49 11/23/2020 0811    Physical Exam:     VS:  BP 116/66    Pulse 73    Ht 6' (1.829 m)    Wt 188 lb (85.3 kg)    SpO2 98%    BMI 25.50 kg/m     Wt Readings from Last 3 Encounters:  09/08/21 188 lb (85.3 kg)  08/09/21 191 lb 3.2 oz (86.7 kg)  02/28/21 182 lb (82.6 kg)    Gen: no distress   Neck: No JVD Cardiac: No Rubs or Gallops, no murmur, RRR, +2 radial pulses Respiratory: Clear to auscultation bilaterally, normal effort, normal  respiratory rate GI: Soft, nontender, non-distended  MS: No  edema;  moves all extremities Integument: Skin feels warn Neuro:  At time of evaluation, alert and oriented to person/place/time/situation Psych: Normal affect, patient feels well   ASSESSMENT:    1. Coronary artery disease involving native coronary artery of native heart without  angina pectoris   2. Essential hypertension   3. Aortic atherosclerosis ([place])   4. Mixed hyperlipidemia     PLAN:    Coronary Artery Disease; Obstructive Hyperlipidemia (mixed) Aortic Atherosclerosis HTN FHX of VT younger brother - rare CP - anatomy: apical disease with non obstructive LAD and D1 by cath - continue ASA 81 mg - on rosuvastatin to 40 mg, goal LDL < 70; will refill - if his fatigue has not improved by 6 month follow up; will trial of BB, on PRN nitro and will get echo  Nutrition Prescription - We have discussed vegan diets as they related to LDL and secondary prevention, goal of less than 10% of calories from fat and minimal amounts of saturated fat - discussed the Liftstyle Heart Trial plant-based diet program had some level of regression of atherosclerosis, and 91% had a reduction in the frequency of angina episodes - discussed roles of excess sugars, and that more than 70% of [place] adults consume at least 10% of their total daily calories from added sugar, and 10% consume 25% or more of their calories from sugar according to recent data - Whole grains as dietary fiber have been associated with a lower risk for CAD in studies  going back to the 1970s  Plabn - whole grains and products made up mostly of whole grains once back from [place] - healthy sources of protein (mostly plants such as legumes and nuts; fish and seafood; low-fat or nonfat dairy; and, as he eats meat, ensuring it is lean and unprocessed) - decrease to Liquid non-tropical vegetable oils as needed - minimally processed foods - minimized intake of added sugars with a focus on sugar sweetened beverages - discussed mindfulness in eating    https://cpncampus.com/biblioteca/files/[email or web address]    Six months with me      Medication Adjustments/Labs and Tests Ordered: Current medicines are reviewed at length with the patient today.  Concerns regarding medicines are outlined above.  Orders Placed This Encounter  Procedures   EKG 12-Lead    Meds ordered this encounter  Medications   rosuvastatin (CRESTOR) 40 MG tablet    Sig: Take 1 tablet (40 mg total) by mouth daily.    Dispense:  90 tablet    Refill:  3     Patient Instructions  Medication Instructions:  Your physician recommends that you continue on your current medications as directed. Please refer to the Current Medication list given to you today.  *If you need a refill on your cardiac medications before your next appointment, please call your pharmacy*   Lab Work: NONE If you have labs (blood work) drawn today and your tests are completely normal, you will receive your results only by: [place] (if you have MyChart) OR A paper copy in the mail If you have any lab test that is abnormal or we need to change your treatment, we will call you to review the results.   Testing/Procedures: NONE   Follow-Up: At [facility], you and your health needs are our priority.  As part of our continuing mission to provide you with exceptional heart care, we have created designated Provider Care Teams.  These Care Teams include your primary  Cardiologist (physician) and Advanced Practice Providers (APPs -  Physician Assistants and Nurse Practitioners) who all work together to provide you with the care you need, when you need it.   Your next appointment:   6 month(s)  The format for your next appointment:   In Person  Provider:  Werner Lean, MD       Signed, Werner Lean, MD  09/08/2021 10:07 AM    [place]

## 2021-09-08 ENCOUNTER — Other Ambulatory Visit: Payer: Self-pay

## 2021-09-08 ENCOUNTER — Ambulatory Visit (INDEPENDENT_AMBULATORY_CARE_PROVIDER_SITE_OTHER): Payer: Medicare Other | Admitting: Internal Medicine

## 2021-09-08 ENCOUNTER — Encounter: Payer: Self-pay | Admitting: Internal Medicine

## 2021-09-08 VITALS — BP 116/66 | HR 73 | Ht 72.0 in | Wt 188.0 lb

## 2021-09-08 DIAGNOSIS — I2510 Atherosclerotic heart disease of native coronary artery without angina pectoris: Secondary | ICD-10-CM | POA: Diagnosis not present

## 2021-09-08 DIAGNOSIS — I10 Essential (primary) hypertension: Secondary | ICD-10-CM

## 2021-09-08 DIAGNOSIS — E782 Mixed hyperlipidemia: Secondary | ICD-10-CM | POA: Diagnosis not present

## 2021-09-08 DIAGNOSIS — I700 Atherosclerosis of aorta: Secondary | ICD-10-CM

## 2021-09-08 MED ORDER — ROSUVASTATIN CALCIUM 40 MG PO TABS: 40.0000 mg | ORAL_TABLET | Freq: Every day | ORAL | 3 refills | Status: DC

## 2021-09-08 NOTE — Patient Instructions (Signed)
Medication Instructions:  ?Your physician recommends that you continue on your current medications as directed. Please refer to the Current Medication list given to you today. ? ?*If you need a refill on your cardiac medications before your next appointment, please call your pharmacy* ? ? ?Lab Work: ?NONE ?If you have labs (blood work) drawn today and your tests are completely normal, you will receive your results only by: ?MyChart Message (if you have MyChart) OR ?A paper copy in the mail ?If you have any lab test that is abnormal or we need to change your treatment, we will call you to review the results. ? ? ?Testing/Procedures: ?NONE ? ? ?Follow-Up: ?At [facility], you and your health needs are our priority.  As part of our continuing mission to provide you with exceptional heart care, we have created designated Provider Care Teams.  These Care Teams include your primary Cardiologist (physician) and Advanced Practice Providers (APPs -  Physician Assistants and Nurse Practitioners) who all work together to provide you with the care you need, when you need it. ? ? ?Your next appointment:   ?6 month(s) ? ?The format for your next appointment:   ?In Person ? ?Provider:   ?Werner Lean, MD  ?  ? ?

## 2021-09-15 DIAGNOSIS — G20 Parkinson's disease: Secondary | ICD-10-CM | POA: Diagnosis not present

## 2021-09-15 DIAGNOSIS — W19XXXA Unspecified fall, initial encounter: Secondary | ICD-10-CM | POA: Diagnosis not present

## 2021-09-15 DIAGNOSIS — R0781 Pleurodynia: Secondary | ICD-10-CM | POA: Diagnosis not present

## 2021-10-03 DIAGNOSIS — M79645 Pain in left finger(s): Secondary | ICD-10-CM | POA: Diagnosis not present

## 2021-10-03 DIAGNOSIS — S2232XD Fracture of one rib, left side, subsequent encounter for fracture with routine healing: Secondary | ICD-10-CM | POA: Diagnosis not present

## 2021-11-01 ENCOUNTER — Ambulatory Visit
Admission: RE | Admit: 2021-11-01 | Discharge: 2021-11-01 | Disposition: A | Discharge status: Home / Self Care | Payer: Medicare Other | Source: Ambulatory Visit | Attending: Neurology | Admitting: Neurology

## 2021-11-01 DIAGNOSIS — R251 Tremor, unspecified: Secondary | ICD-10-CM

## 2021-11-01 DIAGNOSIS — R413 Other amnesia: Secondary | ICD-10-CM | POA: Diagnosis not present

## 2021-11-01 IMAGING — MR MR HEAD W/O CM
12 series · 48 of 48 positions shown · non-contrast
Comparison: None Available.

CLINICAL DATA: Memory loss and tremor

EXAM:
MRI HEAD WITHOUT CONTRAST
TECHNIQUE: Multiplanar, multiecho pulse sequences of the brain and surrounding
structures were obtained without intravenous contrast.

[Series 2: T1 · sagittal · 5.0mm · 0.47mm/px · 1 of 21 slices shown]
[im 1/21]
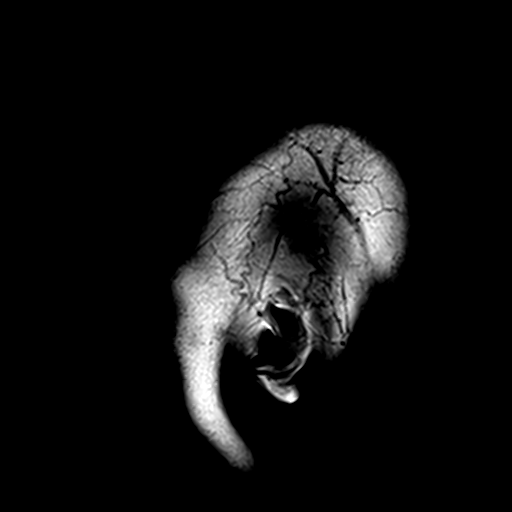

[Series 3: DWI · axial · 3.0mm · 1.80mm/px · z∈[-42,+111]mm · 8 of 104 slices shown (1 of 4)]
[im 1/104]
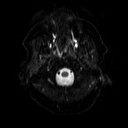
[im 15/104]
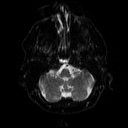
[im 30/104]
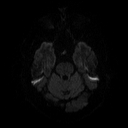
[im 45/104]
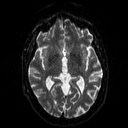
[im 59/104]
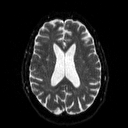
[im 74/104]
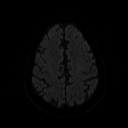
[im 89/104]
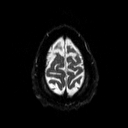
[im 104/104]
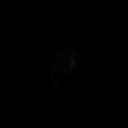

[Series 4: DWI · axial · 3.0mm · 1.80mm/px · z∈[-42,+111]mm · 4 of 52 slices shown (2 of 4)]
[im 1/52]
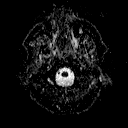
[im 18/52]
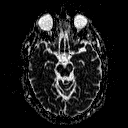
[im 35/52]
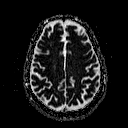
[im 52/52]
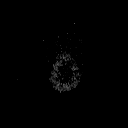

[Series 5: DWI · coronal · 5.0mm · 1.80mm/px · 5 of 70 slices shown (3 of 4)]
[im 1/70]
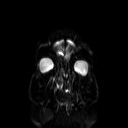
[im 18/70]
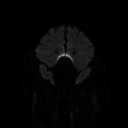
[im 35/70]
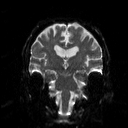
[im 52/70]
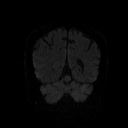
[im 70/70]
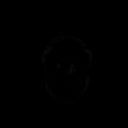

[Series 6: DWI · coronal · 5.0mm · 1.80mm/px · 3 of 35 slices shown (4 of 4)]
[im 1/35]
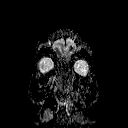
[im 18/35]
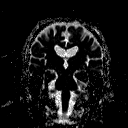
[im 35/35]
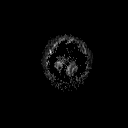

[Series 7: T2 · axial · 5.0mm · 0.51mm/px · z∈[-50,+96]mm · 2 of 22 slices shown (1 of 2)]
[im 1/22]
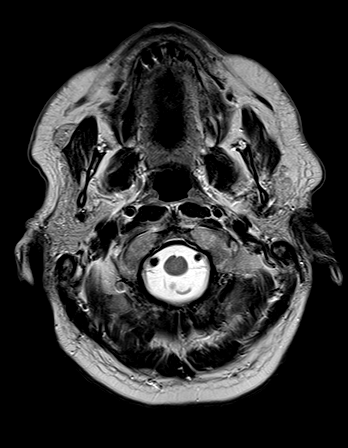
[im 22/22]
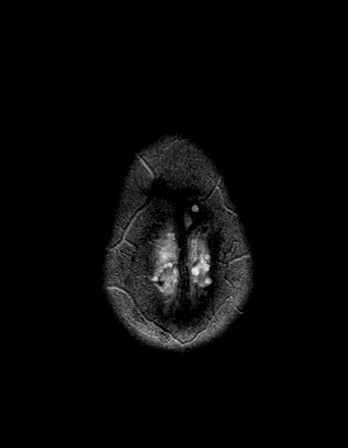

[Series 8: FLAIR · axial · 3.0mm · 0.45mm/px · z∈[-53,+100]mm · 3 of 34 slices shown (1 of 2)]
[im 1/34]
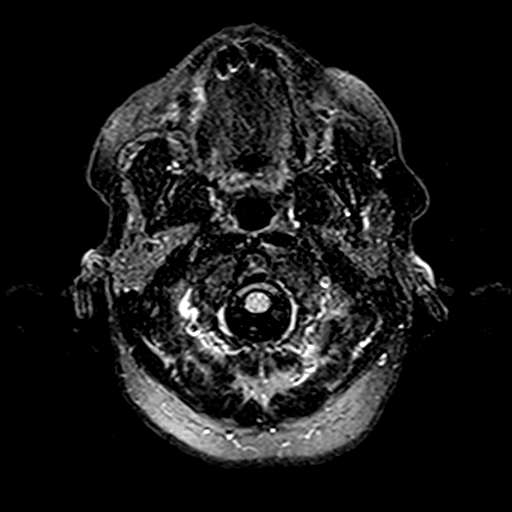
[im 17/34]
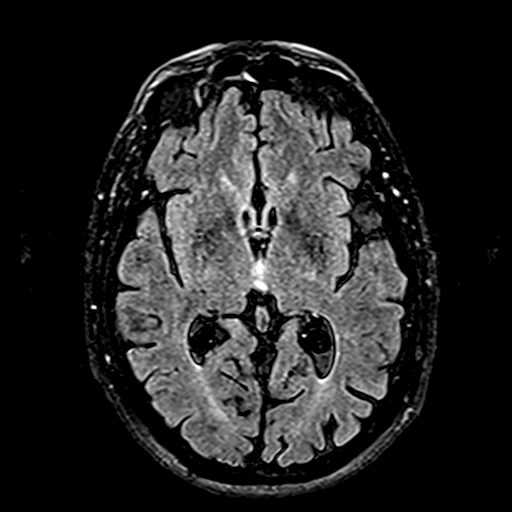
[im 34/34]
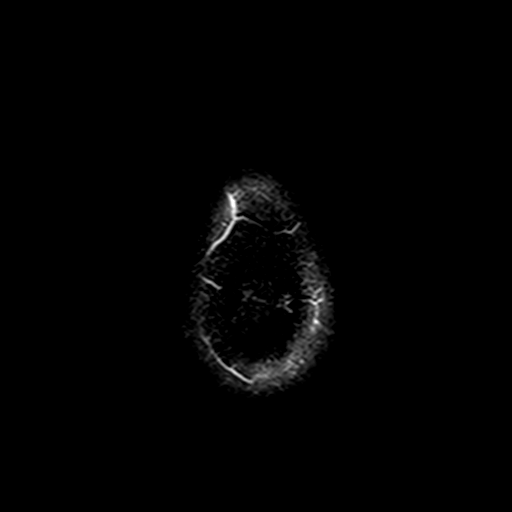

[Series 9: mip_images(sw) · axial · 32.0mm · 0.90mm/px · z∈[-41,+87]mm · 3 of 33 slices shown]
[im 1/33]
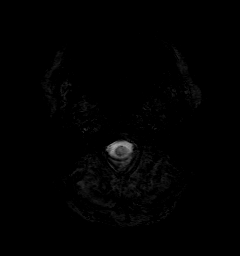
[im 17/33]
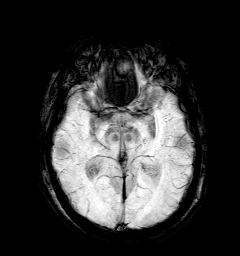
[im 33/33]
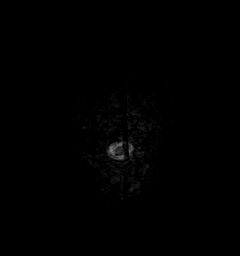

[Series 10: swi_images · axial · 4.0mm · 0.90mm/px · z∈[-55,+101]mm · 3 of 40 slices shown]
[im 1/40]
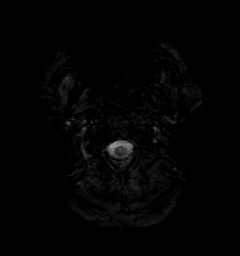
[im 20/40]
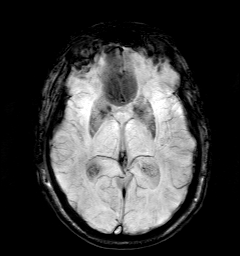
[im 40/40]
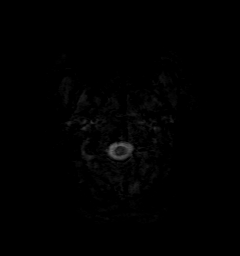

[Series 11: t1_mpr_tra · axial · 1.0mm · 0.71mm/px · z∈[-48,+94]mm · 11 of 144 slices shown]
[im 1/144]
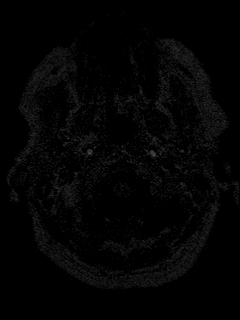
[im 15/144]
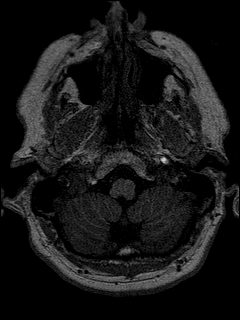
[im 29/144]
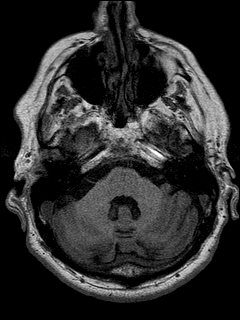
[im 43/144]
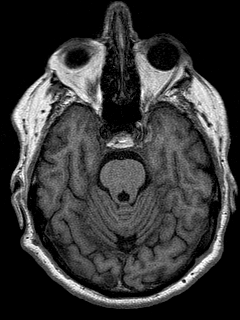
[im 58/144]
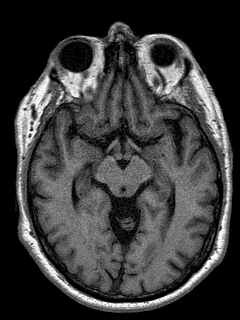
[im 72/144]
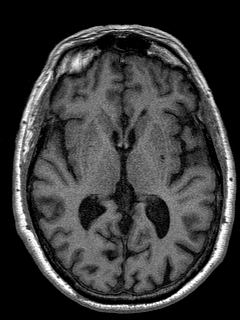
[im 86/144]
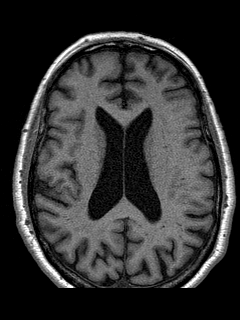
[im 101/144]
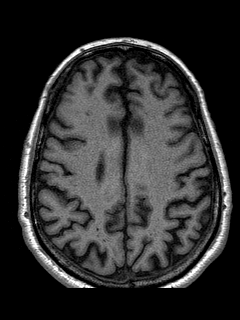
[im 115/144]
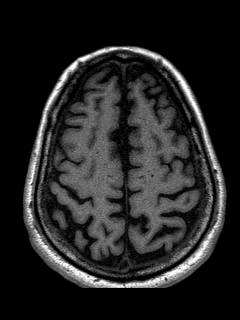
[im 129/144]
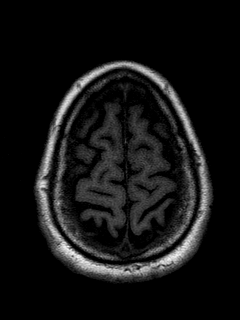
[im 144/144]
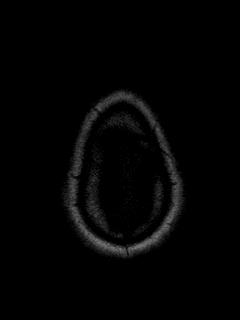

[Series 12: T2 · coronal · 5.0mm · 0.45mm/px · 2 of 26 slices shown (2 of 2)]
[im 1/26]
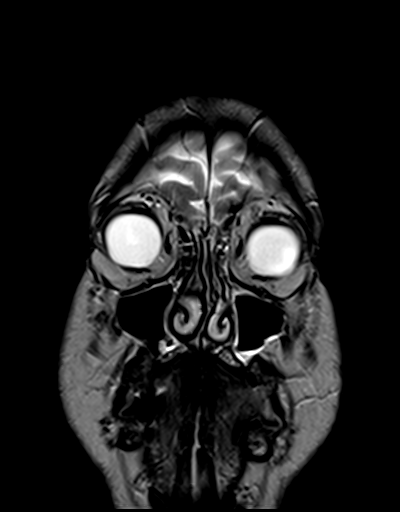
[im 26/26]
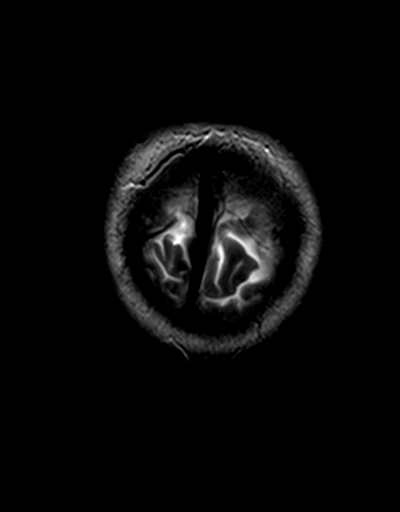

[Series 13: FLAIR · axial · 3.0mm · 0.45mm/px · z∈[-53,+100]mm · 3 of 34 slices shown (2 of 2)]
[im 1/34]
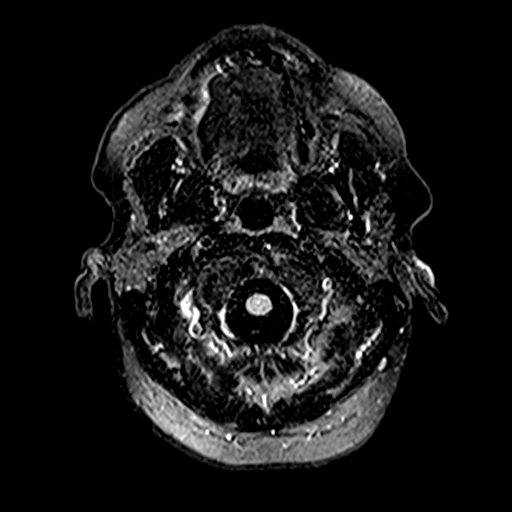
[im 17/34]
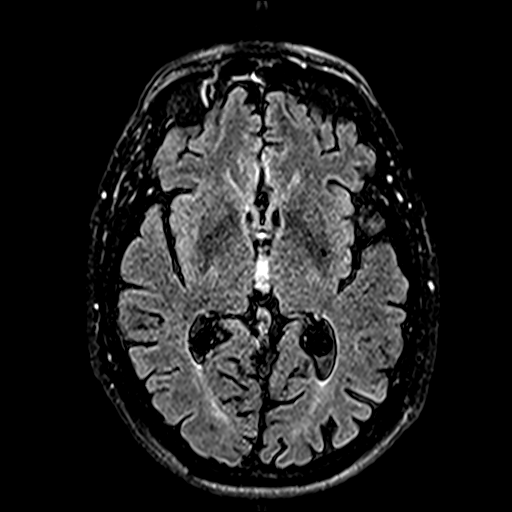
[im 34/34]
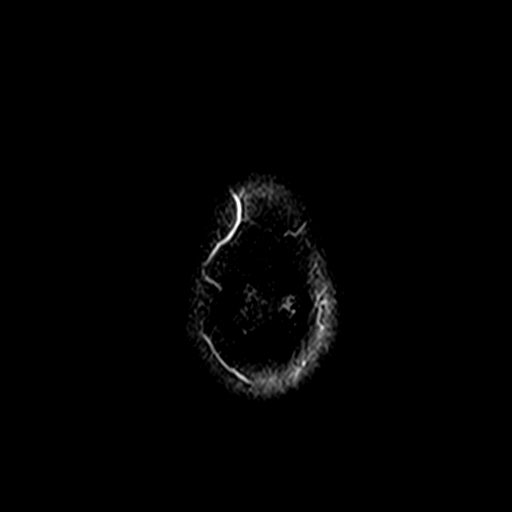

[48 of 48 positions shown; findings below may reference images not displayed]

FINDINGS: Brain: Diffusion imaging does not show any acute or subacute
infarction. No abnormality is seen affecting the brainstem or
cerebellum. Cerebral hemispheres show age related volume loss with
mild chronic small-vessel ischemic change of the white matter. No
cortical or large vessel territory infarction. No mass lesion,
hemorrhage, hydrocephalus or extra-axial collection.

Vascular: Major vessels at the base of the brain show flow.

Skull and upper cervical spine: Abnormal appearance of the marrow of
the clivus. This appearance is indeterminate, but could not be
differentiated from metastatic disease or early chordoma. Do old
images existed any other location? If not, consider postcontrast
[HOSPITAL] this time or short-term interval follow-up, 6 weeks to 3
months.

Sinuses/Orbits: Clear/normal

Other: None
IMPRESSION: No significant brain finding. Mild age related volume loss. Minimal
small vessel change of the hemispheric white matter.

Abnormal T2 bright T1 dark appearance the clivus. Whereas this could
be benign, this could not be differentiated from metastatic disease
or early chordoma. Do old images existed at any other location? If
not, consider postcontrast [HOSPITAL] this time or short-term
interval follow-up in 6 weeks to 3 months.

## 2021-11-02 ENCOUNTER — Telehealth: Payer: Self-pay

## 2021-11-02 NOTE — Telephone Encounter (Signed)
Called patient with the information I was given by Dr. Carles Collet and the patient wants to do a virtual appointment to discuss why he needs to have a second MRI. I did my best to explain but patient has further questions  ?

## 2021-11-03 DIAGNOSIS — Z961 Presence of intraocular lens: Secondary | ICD-10-CM | POA: Diagnosis not present

## 2021-11-03 DIAGNOSIS — H40013 Open angle with borderline findings, low risk, bilateral: Secondary | ICD-10-CM | POA: Diagnosis not present

## 2021-11-03 NOTE — Progress Notes (Signed)
? ?Virtual Visit Via Video  ? ?The purpose of this virtual visit is to provide medical care while limiting exposure to the novel coronavirus.   ? ?Consent was obtained for video visit:  Yes.   ?Answered questions that patient had about telehealth interaction:  Yes.   ?I discussed the limitations, risks, security and privacy concerns of performing an evaluation and management service by telemedicine. I also discussed with the patient that there may be a patient responsible charge related to this service. The patient expressed understanding and agreed to proceed. ? ?Pt location: Home ?Physician Location: office ?Name of referring provider:  Ginger Organ., MD ?I connected with Jeffery Kirby at patients initiation/request on 11/04/2021 at 10:15 AM EDT by video enabled telemedicine application and verified that I am speaking with the correct person using two identifiers. ?Pt MRN:  [id number] ?Pt DOB:  [birth year] ?Video Participants:  Jeffery Kirby;   ? ?Assessment/Plan:  ? ? Tremor ?Tremor remains most consistent with essential tremor.  It has progressed since when I saw him in 2018.  It still is overall fairly mild, but I can also feel some inner tremor now. ?Patient and I discussed medication.  He is really not interested in further medication. ?I did not note any cogwheel rigidity when seen in February, 2023.   ?Patient did have some decreased arm swing.  Because of tremor progression and this, I told him that we would just follow him, perhaps once a year.  He was agreeable to that.  Patient was a little bit overwhelmed with seeing primary care more frequently, as well as seeing his cardiologist and eye doctor, so did not want to overwhelm him with even more visits. ?Memory change ?Cognitively, he did appear pretty normal today, but had several memory complaints, including memory change, illusions and visual distortions. ?Neurocognitive testing is pending. ?B12 was just slightly low and it was recommended  that patient take over the counter B12, 1000 mcg daily ?Fatigue with vision change ?Suspect the new fatigue is probably due to the beta-blocker ?Acetylcholine receptor antibodies negative ?Abnormal brain scan ?We reviewed his brain MRI in detail.  Explained to the patient that intracranially, his brain scan was essentially unremarkable.  However, there was an abnormal in appearance of the marrow of the clivus.  Explained to him that this needed further imaging, including contrast.  Explained to him that sometimes tumors like to form on the clivus, and metastatic disease sometimes will go to the clivus as well.  Explained to him that this is somewhat out of my area of expertise, but it is something that we should further image.  Patient was agreeable to going back for a contrasted scan.  He asked several questions and I answered those to the best of my ability today.  Patient was appreciative. ? ? ?Subjective:  ? ?Jeffery Kirby was seen today for f/u test results.  MRI brain was scheduled after our last visit, primarily because of memory complaints, illusions, visual distortions (although exam was pretty normal).  Intracranially, the examination was pretty unremarkable.  However, radiology reported abnormal appearance of the marrow of the clivus.  Patient has not had previous scans for comparison.  Therefore, radiology recommended postcontrast imaging.  We called the patient to recommend the postcontrast imaging, but he declined it until he spoke with me, which was the purpose of the visit today. ? ?PREVIOUS MEDICATIONS: none to date ? ?ALLERGIES:  No Known Allergies ? ?CURRENT MEDICATIONS:  ?Current Outpatient Medications  ?Medication Instructions  ?  aspirin EC 81 mg, Oral, Daily, Swallow whole.  ? escitalopram (LEXAPRO) 10 mg, Oral, Daily  ? metoprolol tartrate (LOPRESSOR) 12.5 mg, Oral, 2 times daily  ? mupirocin ointment (BACTROBAN) 2 % SMARTSIG:1 Application Topical 2-3 Times Daily  ? naproxen (NAPROSYN) 500  mg, Oral, As needed  ? rosuvastatin (CRESTOR) 40 mg, Oral, Daily  ? vitamin B-12 (CYANOCOBALAMIN) 1,000 mcg, Oral, 2 times weekly  ? ? ?Objective:  ? ?PHYSICAL EXAMINATION:   ? ?VITALS:   ?Vitals:  ? 11/04/21 0956  ?Weight: 188 lb (85.3 kg)  ?Height: '6\' 2"'$  (1.88 m)  ? ? ? ?GEN:  The patient appears stated age and is in NAD. ?HEENT:  Normocephalic, atraumatic.   ? ?Neurological examination: ? ?Orientation: The patient is alert and oriented x3.  ?Cranial nerves: There is good facial symmetry with the exception of some left ptosis/pseudoptosis.  The speech is fluent and clear. Hearing is intact to conversational tone. ? ? ? ? ?Follow up Instructions  ? ?  ? -I discussed the assessment and treatment plan with the patient. The patient was provided an opportunity to ask questions and all were answered. The patient agreed with the plan and demonstrated an understanding of the instructions. ?  ?The patient was advised to call back or seek an in-person evaluation if the symptoms worsen or if the condition fails to improve as anticipated. ? ? ? ? ?Alonza Bogus, DO ? ? ?Cc:  Ginger Organ., MD ? ?

## 2021-11-03 NOTE — Telephone Encounter (Signed)
Patient scheduled for virtual Friday 11-04-21 at 10:15 called patient and he has agreed  ?

## 2021-11-04 ENCOUNTER — Encounter: Payer: Self-pay | Admitting: Neurology

## 2021-11-04 ENCOUNTER — Telehealth (INDEPENDENT_AMBULATORY_CARE_PROVIDER_SITE_OTHER): Payer: Medicare Other | Admitting: Neurology

## 2021-11-04 VITALS — Ht 74.0 in | Wt 188.0 lb

## 2021-11-04 DIAGNOSIS — R9402 Abnormal brain scan: Secondary | ICD-10-CM

## 2021-11-04 DIAGNOSIS — R251 Tremor, unspecified: Secondary | ICD-10-CM

## 2021-11-04 DIAGNOSIS — I2510 Atherosclerotic heart disease of native coronary artery without angina pectoris: Secondary | ICD-10-CM | POA: Diagnosis not present

## 2021-11-04 DIAGNOSIS — R413 Other amnesia: Secondary | ICD-10-CM

## 2021-11-04 NOTE — Addendum Note (Signed)
Addended by: Armen Pickup A on: 11/04/2021 10:23 AM ? ? Modules accepted: Orders ? ?

## 2021-11-23 ENCOUNTER — Ambulatory Visit
Admission: RE | Admit: 2021-11-23 | Discharge: 2021-11-23 | Disposition: A | Discharge status: Home / Self Care | Payer: Medicare Other | Source: Ambulatory Visit | Attending: Neurology | Admitting: Neurology

## 2021-11-23 DIAGNOSIS — R9402 Abnormal brain scan: Secondary | ICD-10-CM

## 2021-11-23 DIAGNOSIS — R930 Abnormal findings on diagnostic imaging of skull and head, not elsewhere classified: Secondary | ICD-10-CM | POA: Diagnosis not present

## 2021-11-23 IMAGING — MR MR HEAD W/ CM
6 series · 26 of 48 positions shown · IV contrast (multihance)
Comparison: Noncontrast MRI [DATE]

CLINICAL DATA: Abnormal MRI brain

EXAM:
MRI HEAD WITH CONTRAST
TECHNIQUE: Multiplanar, multiecho pulse sequences of the brain and surrounding
structures were obtained with intravenous contrast.
CONTRAST:  17mL MULTIHANCE GADOBENATE DIMEGLUMINE 529 MG/ML IV SOLN

[Series 2: T1 · sagittal · 5.0mm · 0.45mm/px · 4 of 23 slices shown]
[im 1/23]
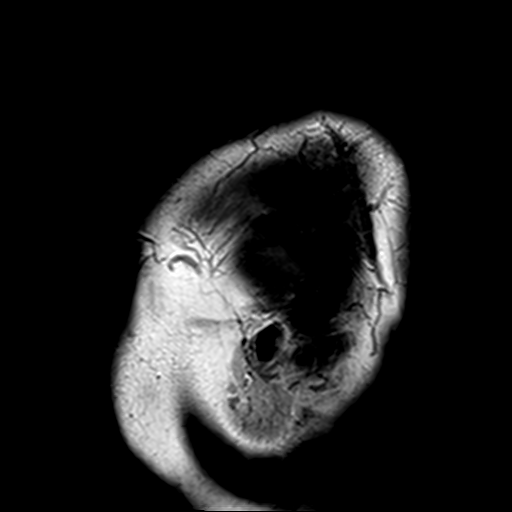
[im 8/23]
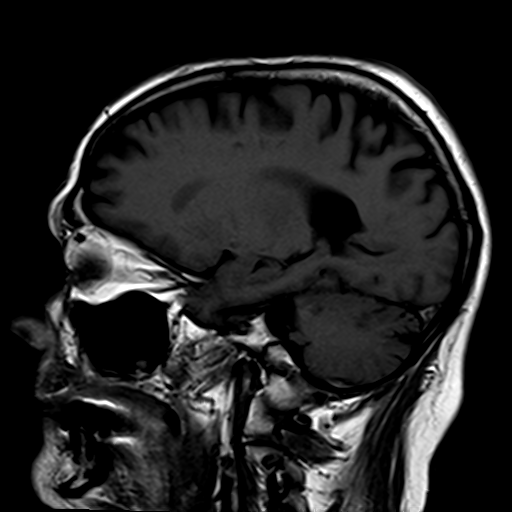
[im 15/23]
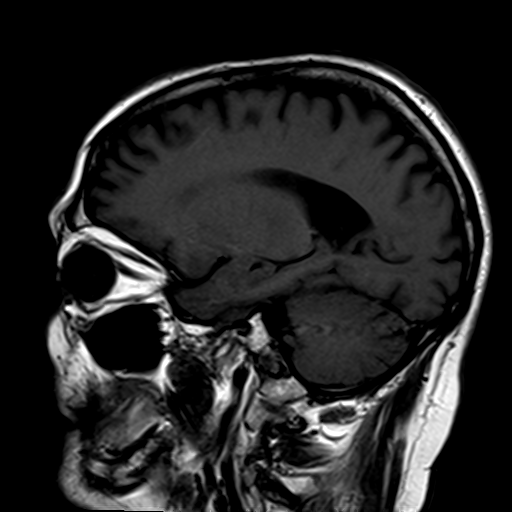
[im 23/23]
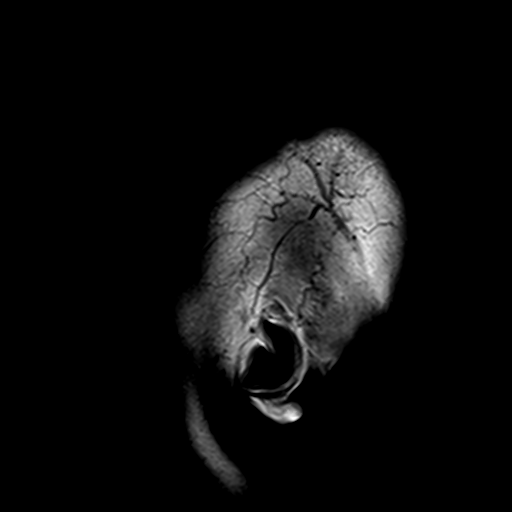

[Series 3: T2 · coronal · 5.0mm · 0.45mm/px · 5 of 28 slices shown]
[im 1/28]
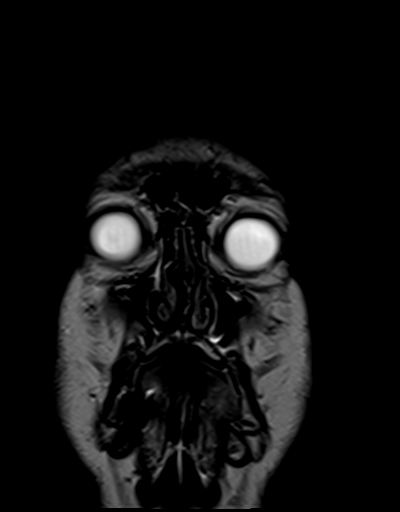
[im 7/28]
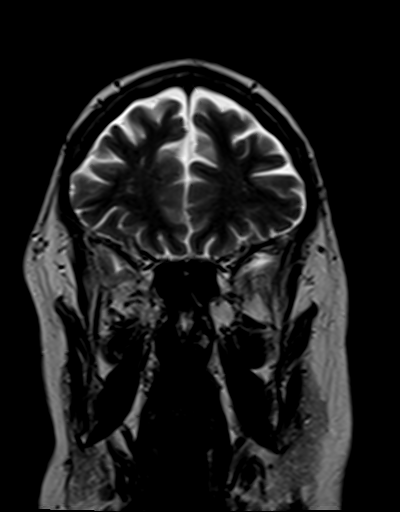
[im 14/28]
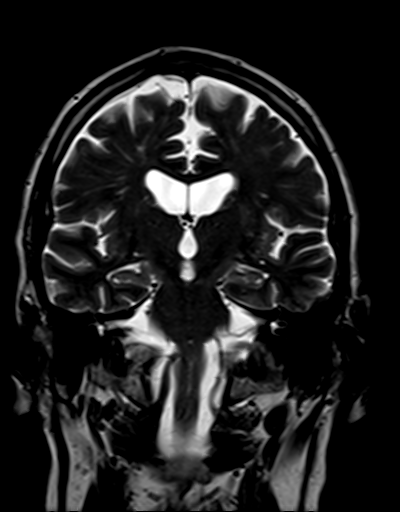
[im 21/28]
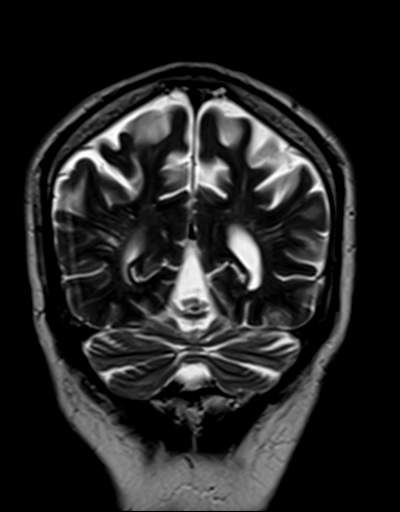
[im 28/28]
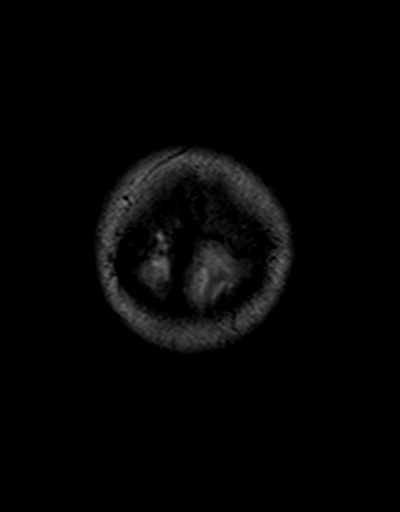

[Series 4: t1_mpr_tra post · axial · 1.0mm · 0.75mm/px · z∈[-69,+63]mm · 8 of 144 slices shown]
[im 6/144]
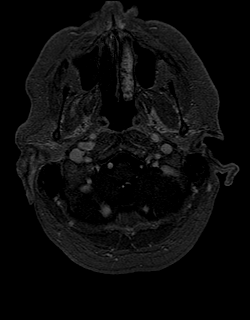
[im 23/144]
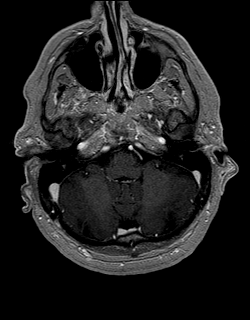
[im 46/144]
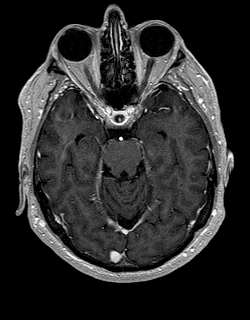
[im 63/144]
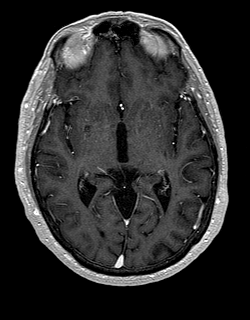
[im 81/144]
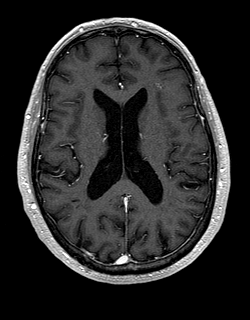
[im 98/144]
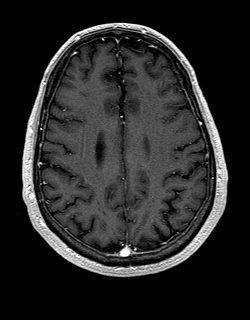
[im 121/144]
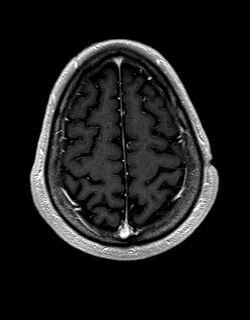
[im 138/144]
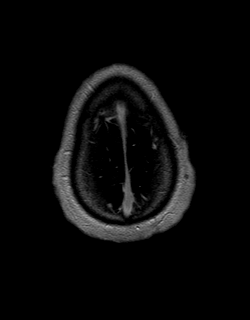

[Series 5: post cor · coronal · 5.0mm · 0.45mm/px · 1 of 28 slices shown]
[im 1/28]
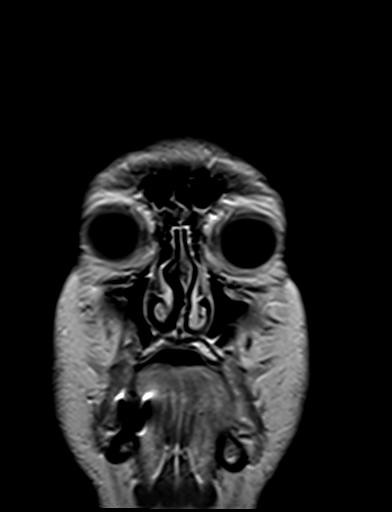

[Series 6: T1 post-contrast · sagittal · 5.0mm · 0.45mm/px · 4 of 23 slices shown]
[im 1/23]
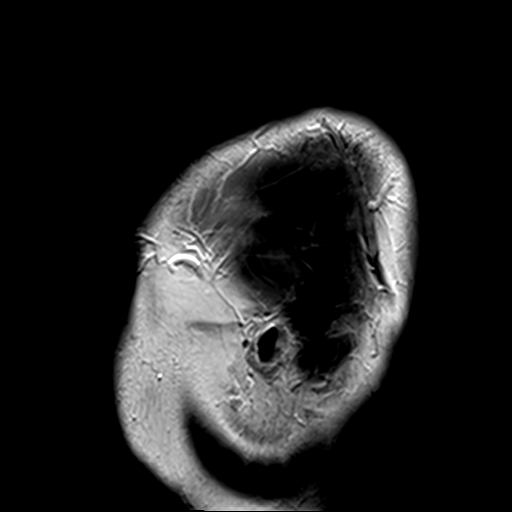
[im 8/23]
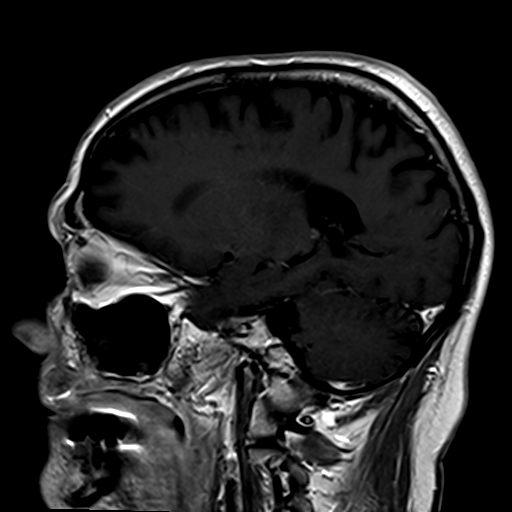
[im 15/23]
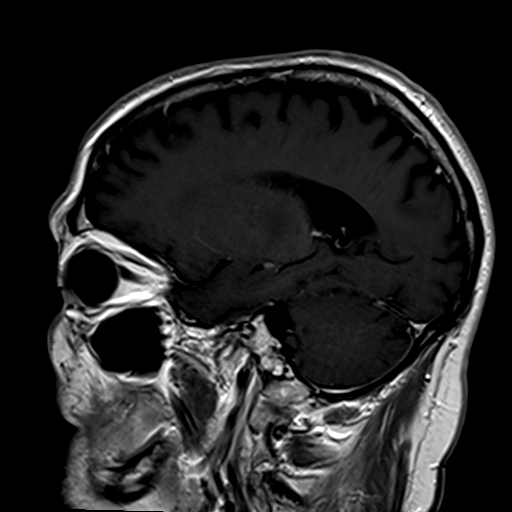
[im 23/23]
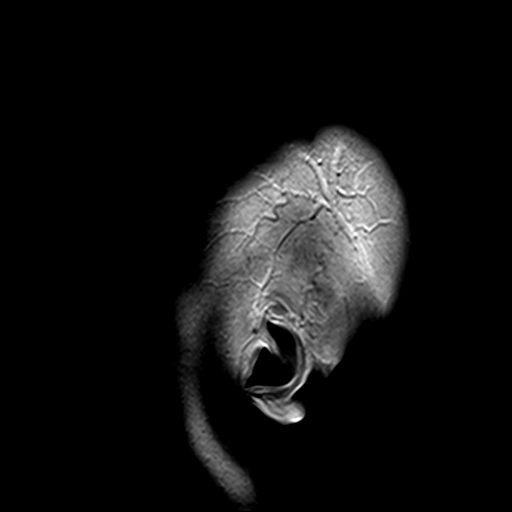

[Series 7: T1 fat-sat post-contrast · sagittal · 5.0mm · 0.45mm/px · 4 of 23 slices shown]
[im 1/23]
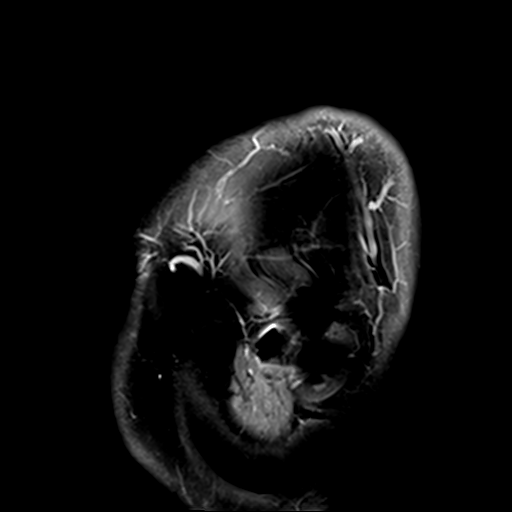
[im 8/23]
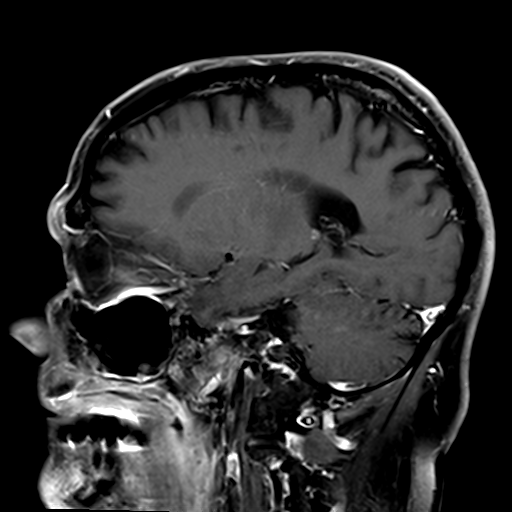
[im 15/23]
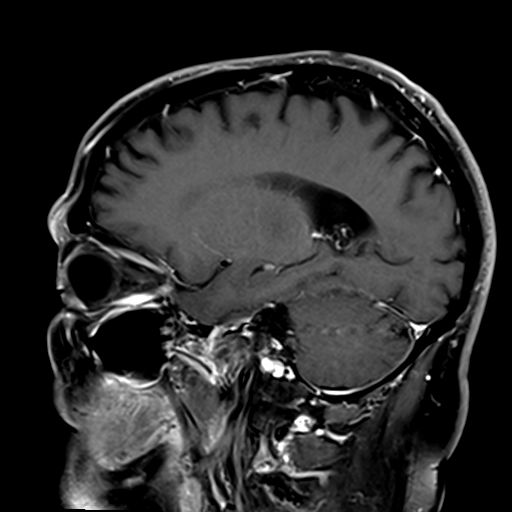
[im 23/23]
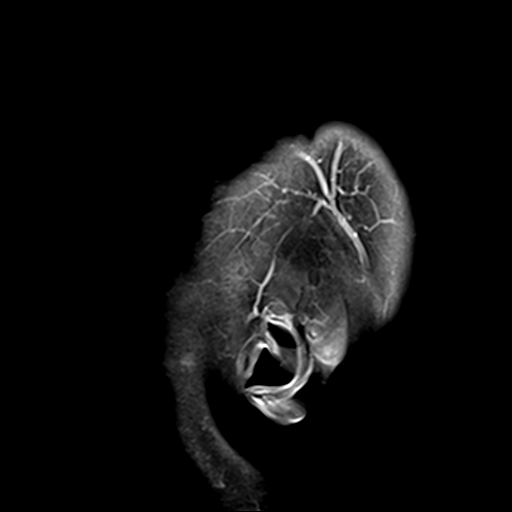

[26 of 48 positions shown; findings below may reference images not displayed]

FINDINGS: As before, there is somewhat patchy abnormal T1 marrow signal within
the clivus. There is corresponding enhancement on this study. No
evidence of extraosseous extension. No abnormal brain parenchymal
enhancement.
IMPRESSION: Abnormal signal within the clivus demonstrates enhancement, which
does not substantially change the differential. No aggressive
features at this time. Recommend 3 month follow-up to ensure
stability.

## 2021-11-23 MED ORDER — GADOBENATE DIMEGLUMINE 529 MG/ML IV SOLN
17.0000 mL | Freq: Once | INTRAVENOUS | Status: AC | PRN
Start: 2021-11-23 — End: 2021-11-23
  Administered 2021-11-23: 17 mL via INTRAVENOUS

## 2021-11-24 ENCOUNTER — Telehealth: Payer: Self-pay | Admitting: Neurology

## 2021-11-24 DIAGNOSIS — D499 Neoplasm of unspecified behavior of unspecified site: Secondary | ICD-10-CM

## 2021-11-24 NOTE — Telephone Encounter (Signed)
LMOVM, To call the office back.

## 2021-11-24 NOTE — Telephone Encounter (Signed)
Please tell patient that the abnormality on the clivus enhanced with contrast.  Radiology recommended we follow this up with a repeat scan in 3 months but I would rather [place] go ahead and send him to oncology for an opinion.  If patient agreeable, please refer.  Dx:  clival tumor

## 2021-11-24 NOTE — Telephone Encounter (Signed)
Advised patient of Dr.Tat note. Patient wants to speak to Dr.Tat in regards to the note as well as the referral location.  Patient advise Dr.Tat is out of the office until next Tuesday.

## 2021-11-30 ENCOUNTER — Other Ambulatory Visit: Payer: Self-pay

## 2021-11-30 DIAGNOSIS — R9402 Abnormal brain scan: Secondary | ICD-10-CM

## 2021-11-30 DIAGNOSIS — D499 Neoplasm of unspecified behavior of unspecified site: Secondary | ICD-10-CM

## 2021-11-30 DIAGNOSIS — C410 Malignant neoplasm of bones of skull and face: Secondary | ICD-10-CM

## 2021-11-30 NOTE — Telephone Encounter (Signed)
Called the Cancer center [phone] and left another message for the referral coordinator Driscilla Moats to follow up on patient referral. Left my contact number for a call back.

## 2021-11-30 NOTE — Telephone Encounter (Signed)
Resent referral to Medical Oncology. 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

## 2021-12-01 ENCOUNTER — Telehealth: Payer: Self-pay | Admitting: Internal Medicine

## 2021-12-01 NOTE — Telephone Encounter (Signed)
Called pt to sch appt per 5/30 referral. No answer. Left msg for pt to call back to sch appt.  Returned referring office's msgs from [place]. Was unable to reach her to discuss referral. Called 3x. Sent secure chat with update about pt's referral. I let her know we have reached out to pt and are waiting for pt to respond to sch appt.

## 2021-12-01 NOTE — Telephone Encounter (Signed)
Called Stephaine referral coordinator at the [facility] for a 3rd time and left a message to please give me a call back in regards to patient needing to be scheduled. Will give her sometime to call me back and call again in a few. I asked Stephaine to let front desk know to come and grab me because we are really needing to speak to her.

## 2021-12-01 NOTE — Telephone Encounter (Signed)
Received message from Karie Mainland: "  Hi Jeffery Kirby, I am the referral coordinator at the [facility]. I know you have been trying to reach me in regards to scheduling this pt. I think we've been playing phone tag, I've tried to reach you a few times today, but keep missing you. I wanted to let you know that we do have this referral and have reached out to the pt to get him scheduled. The referral was under clinical review to make sure we scheduled the pt with the most appropriate provider. All our notes are documented in the patient's referral. I just wanted to give you an update and let you know where we are at with getting him scheduled."

## 2021-12-02 ENCOUNTER — Telehealth: Payer: Self-pay | Admitting: Internal Medicine

## 2021-12-02 NOTE — Telephone Encounter (Signed)
Called patient and informed him that Medical Oncology will be giving him a call and to be on a lookout for a call from their office. Patient verbalized understanding and had no further questions or concerns.

## 2021-12-02 NOTE — Telephone Encounter (Signed)
Scheduled appt per 5/31 referral. Pt is aware of appt date and time. Pt is aware to arrive 15 mins prior to appt time and to bring and updated insurance card. Pt is aware of appt location.

## 2021-12-15 ENCOUNTER — Telehealth: Payer: Self-pay | Admitting: Internal Medicine

## 2021-12-15 ENCOUNTER — Inpatient Hospital Stay: Payer: Medicare Other | Attending: Internal Medicine | Admitting: Internal Medicine

## 2021-12-15 ENCOUNTER — Other Ambulatory Visit: Payer: Self-pay

## 2021-12-15 DIAGNOSIS — R937 Abnormal findings on diagnostic imaging of other parts of musculoskeletal system: Secondary | ICD-10-CM | POA: Diagnosis not present

## 2021-12-15 NOTE — Progress Notes (Signed)
[facility] [address]  [place], [place] [address] [phone]   New Patient Evaluation  Date of Service: 12/15/21 Patient Name: Jeffery Kirby Patient MRN: [id number] Patient DOB: [birth year] Provider: Henreitta Leber, MD  Identifying Statement:  Jeffery Kirby is a 76 y.o. male with  clival   lesion  who presents for initial consultation Jeffery evaluation.    Referring Provider: Vladimir Faster, DO [address]  [address] [place],  [place] [address]  Oncologic History: Oncology History   No history exists.    Biomarkers:  MGMT Unknown.  IDH 1/2 Unknown.  EGFR Unknown  TERT Unknown   History of Present Illness: The patient's records from the referring physician were obtained Jeffery reviewed Jeffery the patient interviewed to confirm this HPI.  Jeffery Kirby presents to review recent abnormal brain/spine MRI finding.  Jeffery Kirby, Jeffery Kirby, Jeffery some tremors.  MRI demonstrated enhancing lesion within the clivus bone.  Jeffery denies any headaches, neck pain, seizures.  Jeffery is functionally independent, plans extensive trip to [place] with his wife this summer.  Medications: Current Outpatient Medications on File Prior to Visit  Medication Sig Dispense Refill   aspirin EC 81 MG tablet Take 1 tablet (81 mg total) by mouth daily. Swallow whole. 90 tablet 3   metoprolol tartrate (LOPRESSOR) 25 MG tablet Take 0.5 tablets (12.5 mg total) by mouth 2 (two) times daily. 90 tablet 3   naproxen (NAPROSYN) 500 MG tablet Take 500 mg by mouth as needed.     rosuvastatin (CRESTOR) 40 MG tablet Take 1 tablet (40 mg total) by mouth daily. 90 tablet 3   vitamin B-12 (CYANOCOBALAMIN) 1000 MCG tablet Take 1,000 mcg by mouth 2 (two) times a week.     No current facility-administered medications on file prior to visit.    Allergies: No Known Allergies Past Medical History:  Past Medical  History:  Diagnosis Date   Angina pectoris (HCC)    Cataract    Exertional chest pain    Hyperlipidemia    Hypertension    Shingles 2012   Tinnitus    Past Surgical History:  Past Surgical History:  Procedure Laterality Date   COLONOSCOPY     LEFT HEART CATH Jeffery CORONARY ANGIOGRAPHY N/A 08/16/2020   Procedure: LEFT HEART CATH Jeffery CORONARY ANGIOGRAPHY;  Surgeon: Yvonne Kendall, MD;  Location: [facility] INVASIVE [facility];  Service: Cardiovascular;  Laterality: N/A;   ROTATOR CUFF REPAIR Bilateral    wisdom teeth     Social History:  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  Tobacco Use   Smoking status: Never   Smokeless tobacco: Never  Vaping Use   Vaping Use: Never used  Substance Jeffery Sexual Activity   Alcohol use: Yes    Comment: occasional beer   Drug use: Not Currently   Sexual activity: Not on file  Other Topics Concern   Not on file  Social History Narrative   Right handed   Lives with wife    Retired   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 Strain: Not on file  Food Insecurity:  Not on file  Transportation Needs: Not on file  Physical Activity: Not on file  Stress: Not on file  Social Connections: Not on file  Intimate Partner Violence: Not on file   Family History:  Family History  Problem Relation Age of Onset   Diabetes Mother    Kidney disease Mother    COPD Mother    Heart attack Father    Heart attack Sister    Heart attack Sister    Colon cancer Neg Hx    Colon polyps Neg Hx    Esophageal cancer Neg Hx    Rectal cancer Neg Hx    Stomach cancer Neg Hx     Review of Systems: Constitutional: Doesn't report fevers, chills or abnormal weight loss Eyes: Doesn't report blurriness of  vision Ears, nose, mouth, throat, Jeffery face: Doesn't report sore throat Respiratory: Doesn't report cough, dyspnea or wheezes Cardiovascular: Doesn't report palpitation, chest discomfort  Gastrointestinal:  Doesn't report nausea, constipation, diarrhea GU: Doesn't report incontinence Skin: Doesn't report skin rashes Neurological: Per HPI Musculoskeletal: Doesn't report joint pain Behavioral/Psych: Doesn't report anxiety  Physical Exam: Vitals:   12/15/21 1210  BP: 110/70  Pulse: 82  Resp: 16  Temp: 97.8 F (36.6 C)  SpO2: 98%   KPS: 90. General: Alert, cooperative, pleasant, in no acute distress Head: Normal EENT: No conjunctival injection or scleral icterus.  Lungs: Resp effort normal Cardiac: Regular rate Abdomen: Non-distended abdomen Skin: No rashes cyanosis or petechiae. Extremities: No clubbing or edema  Neurologic Exam: Mental Status: Awake, alert, attentive to examiner. Oriented to self Jeffery environment. Language is fluent with intact comprehension.  Cranial Nerves: Visual acuity is grossly normal. Visual fields are full. Extra-ocular movements intact. No ptosis. Face is symmetric Motor: Tone Jeffery bulk are normal. Power is full in both arms Jeffery legs. Reflexes are symmetric, no pathologic reflexes present.  Sensory: Intact to light touch Gait: Normal.   Labs: I have reviewed the data as listed    Component Value Date/Time   NA 140 08/05/2020 1121   K 4.3 08/05/2020 1121   CL 103 08/05/2020 1121   CO2 24 08/05/2020 1121   GLUCOSE 89 08/05/2020 1121   BUN 12 08/05/2020 1121   CREATININE 1.04 08/05/2020 1121   CALCIUM 9.2 08/05/2020 1121   PROT 6.4 11/23/2020 0811   ALBUMIN 4.5 11/23/2020 0811   AST 23 11/23/2020 0811   ALT 34 11/23/2020 0811   ALKPHOS 57 11/23/2020 0811   BILITOT 0.7 11/23/2020 0811   GFRNONAA 71 08/05/2020 1121   GFRAA 83 08/05/2020 1121   Lab Results  Component Value Date   WBC 8.7 08/10/2020   HGB 15.1 08/10/2020   HCT 43.5  08/10/2020   MCV 88 08/10/2020   PLT 203 08/10/2020    Imaging:  MR BRAIN W CONTRAST  Result Date: 11/24/2021 CLINICAL DATA:  Abnormal MRI brain EXAM: MRI HEAD WITH CONTRAST TECHNIQUE: Multiplanar, multiecho pulse sequences of the brain Jeffery surrounding structures were obtained with intravenous contrast. CONTRAST:  66mL MULTIHANCE GADOBENATE DIMEGLUMINE 529 MG/ML IV SOLN COMPARISON:  Noncontrast MRI 11/01/2021 FINDINGS: As before, there is somewhat patchy abnormal T1 marrow signal within the clivus. There is corresponding enhancement on this study. No evidence of extraosseous extension. No abnormal brain parenchymal enhancement. IMPRESSION: Abnormal signal within the clivus demonstrates enhancement, which does not substantially change the differential. No aggressive features at this time. Recommend 3 month follow-up to ensure stability. Electronically Signed   By: Macy Mis M.D.   On:  11/24/2021 09:32    Pathology: n/a  Assessment/Plan Abnormal magnetic resonance imaging of cervical spine  We appreciate the opportunity to participate in the care of Severus Brodzinski.  Jeffery presents today with incidental finding of clivus lesion.  The symptoms that led to the scan are not related to this finding.    Etiology is unclear at this time, 3 possibilities stand out: 1) Chordoma, in earlier than normal stage of development (given incidental nature of imaging) 2) Metastasis from other systemic primary 3) Static embryologic or vascular lesion, non-neoplastic  We recommended repeating a contrast enhanced MRI brain in 3 months, which we will review in brain/spine tumor board.  We will defer systemic staging due to lack of symptoms, low likelihood of metastatic process.  Jeffery is agreeable with this plan.  We will give him a call after the next MRI in September.  Screening for potential clinical trials was performed Jeffery discussed using eligibility criteria for active protocols at [facility],  loco-regional tertiary centers, as well as national database available on [email or web address].com.    The patient is not a candidate for a research protocol at this time due to no suitable study identified.   We spent twenty additional minutes teaching regarding the natural history, biology, Jeffery historical experience in the treatment of brain tumors. We then discussed in detail the current recommendations for therapy focusing on the mode of administration, mechanism of action, anticipated toxicities, Jeffery quality of life Kirby associated with this plan. We also provided teaching sheets for the patient to take home as an additional resource.  All questions were answered. The patient knows to call the clinic with any problems, questions or concerns. No barriers to learning were detected.  The total time spent in the encounter was 45 minutes Jeffery more than 50% was on counseling Jeffery review of test results   Ventura Sellers, MD Medical Director of Neuro-Oncology [facility] 12/15/21 3:47 PM

## 2021-12-15 NOTE — Telephone Encounter (Signed)
Per 6/15 los called and spoke to pt about telephone visit.  Pt confirmed appointment

## 2021-12-16 ENCOUNTER — Telehealth: Payer: Self-pay

## 2021-12-16 NOTE — Telephone Encounter (Signed)
Pt requested information be sent to him via mychart per his last appt with Dr.Vaslow. Message sent to Dr.Vaslow. no further needs.

## 2022-01-19 DIAGNOSIS — G20 Parkinson's disease: Secondary | ICD-10-CM | POA: Diagnosis not present

## 2022-01-19 DIAGNOSIS — I209 Angina pectoris, unspecified: Secondary | ICD-10-CM | POA: Diagnosis not present

## 2022-01-19 DIAGNOSIS — R7301 Impaired fasting glucose: Secondary | ICD-10-CM | POA: Diagnosis not present

## 2022-01-19 DIAGNOSIS — F320 Major depressive disorder, single episode, mild: Secondary | ICD-10-CM | POA: Diagnosis not present

## 2022-01-19 DIAGNOSIS — I10 Essential (primary) hypertension: Secondary | ICD-10-CM | POA: Diagnosis not present

## 2022-01-19 DIAGNOSIS — R9089 Other abnormal findings on diagnostic imaging of central nervous system: Secondary | ICD-10-CM | POA: Diagnosis not present

## 2022-01-19 DIAGNOSIS — G3184 Mild cognitive impairment, so stated: Secondary | ICD-10-CM | POA: Diagnosis not present

## 2022-01-19 DIAGNOSIS — N402 Nodular prostate without lower urinary tract symptoms: Secondary | ICD-10-CM | POA: Diagnosis not present

## 2022-01-19 DIAGNOSIS — E785 Hyperlipidemia, unspecified: Secondary | ICD-10-CM | POA: Diagnosis not present

## 2022-01-19 DIAGNOSIS — I2510 Atherosclerotic heart disease of native coronary artery without angina pectoris: Secondary | ICD-10-CM | POA: Diagnosis not present

## 2022-01-19 DIAGNOSIS — G250 Essential tremor: Secondary | ICD-10-CM | POA: Diagnosis not present

## 2022-01-31 ENCOUNTER — Other Ambulatory Visit: Payer: Self-pay

## 2022-01-31 ENCOUNTER — Telehealth: Payer: Self-pay

## 2022-01-31 DIAGNOSIS — R4189 Other symptoms and signs involving cognitive functions and awareness: Secondary | ICD-10-CM

## 2022-02-08 NOTE — Telephone Encounter (Signed)
done

## 2022-02-09 NOTE — Telephone Encounter (Signed)
done

## 2022-02-14 ENCOUNTER — Encounter: Payer: Self-pay | Admitting: Psychology

## 2022-02-21 ENCOUNTER — Other Ambulatory Visit: Payer: Self-pay | Admitting: Radiation Therapy

## 2022-02-21 DIAGNOSIS — L570 Actinic keratosis: Secondary | ICD-10-CM | POA: Diagnosis not present

## 2022-02-21 DIAGNOSIS — D225 Melanocytic nevi of trunk: Secondary | ICD-10-CM | POA: Diagnosis not present

## 2022-02-21 DIAGNOSIS — D2271 Melanocytic nevi of right lower limb, including hip: Secondary | ICD-10-CM | POA: Diagnosis not present

## 2022-02-21 DIAGNOSIS — L918 Other hypertrophic disorders of the skin: Secondary | ICD-10-CM | POA: Diagnosis not present

## 2022-02-21 DIAGNOSIS — D2372 Other benign neoplasm of skin of left lower limb, including hip: Secondary | ICD-10-CM | POA: Diagnosis not present

## 2022-02-21 DIAGNOSIS — L821 Other seborrheic keratosis: Secondary | ICD-10-CM | POA: Diagnosis not present

## 2022-02-21 DIAGNOSIS — C44719 Basal cell carcinoma of skin of left lower limb, including hip: Secondary | ICD-10-CM | POA: Diagnosis not present

## 2022-02-21 DIAGNOSIS — Z85828 Personal history of other malignant neoplasm of skin: Secondary | ICD-10-CM | POA: Diagnosis not present

## 2022-02-21 DIAGNOSIS — Z85820 Personal history of malignant melanoma of skin: Secondary | ICD-10-CM | POA: Diagnosis not present

## 2022-02-21 DIAGNOSIS — L723 Sebaceous cyst: Secondary | ICD-10-CM | POA: Diagnosis not present

## 2022-02-21 DIAGNOSIS — D2261 Melanocytic nevi of right upper limb, including shoulder: Secondary | ICD-10-CM | POA: Diagnosis not present

## 2022-02-21 DIAGNOSIS — D2262 Melanocytic nevi of left upper limb, including shoulder: Secondary | ICD-10-CM | POA: Diagnosis not present

## 2022-03-07 NOTE — Progress Notes (Unsigned)
Cardiology Office Note:    Date:  03/08/2022   ID:  Jeffery Kirby, DOB Jan 18, 1946, MRN [id number]  PCP:  Ginger Organ., MD  [facility] Cardiologist: Rudean Haskell MD [facility] Electrophysiologist:  None   CC: CAD f/u  History of Present Illness:    Jeffery Kirby is a 76 y.o. male with a HTN and HLD who presented for evaluation 07/13/20.  2022: Cardiac CT which showed multi-vessel disease; patient [id number] non obstructive disease save for with obstructive apical disease. With medical therapy, lightheaded on BB.   2023: started dietary changes for CAD secondary prevention.  Patient notes that he is doing ok.   Had a great time in [place] There are no interval hospital/ED visit.    He has progressively had worsening fatigue. He had new dizziness and lightheadedness with standing. Is having new tightness and heaviness with his chest. Also gets tightness when eating. Works out a few days a wee- had chest tightness with treadmill. No SOB. No sudden onset near syncope  Past Medical History:  Diagnosis Date   Angina pectoris ([place])    Cataract    Exertional chest pain    Hyperlipidemia    Hypertension    Shingles 2012   Tinnitus     Past Surgical History:  Procedure Laterality Date   COLONOSCOPY     LEFT HEART CATH AND CORONARY ANGIOGRAPHY N/A 08/16/2020   Procedure: LEFT HEART CATH AND CORONARY ANGIOGRAPHY;  Surgeon: Nelva Bush, MD;  Location: [place] CV LAB;  Service: Cardiovascular;  Laterality: N/A;   ROTATOR CUFF REPAIR Bilateral    wisdom teeth      Current Medications: Current Meds  Medication Sig   aspirin EC 81 MG tablet Take 1 tablet (81 mg total) by mouth daily. Swallow whole.   naproxen (NAPROSYN) 500 MG tablet Take 500 mg by mouth as needed for mild pain or moderate pain.   nitroGLYCERIN (NITROSTAT) 0.4 MG SL tablet Place 1 tablet (0.4 mg total) under the tongue every 5 (five) minutes as needed for chest pain.    ranolazine (RANEXA) 500 MG 12 hr tablet Take 1 tablet (500 mg total) by mouth 2 (two) times daily.   rosuvastatin (CRESTOR) 40 MG tablet Take 1 tablet (40 mg total) by mouth daily.   vitamin B-12 (CYANOCOBALAMIN) 1000 MCG tablet Take 1,000 mcg by mouth 2 (two) times a week.   [DISCONTINUED] metoprolol tartrate (LOPRESSOR) 25 MG tablet Take 0.5 tablets (12.5 mg total) by mouth 2 (two) times daily.     Allergies:   Patient has no known allergies.   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  Tobacco Use   Smoking status: Never   Smokeless tobacco: Never  Vaping Use   Vaping Use: Never used  Substance and Sexual Activity   Alcohol use: Yes    Comment: occasional beer   Drug use: Not Currently   Sexual activity: Not on file  Other Topics Concern   Not on file  Social History Narrative   Right handed   Lives with wife    Retired   Investment banker, operational of Radio broadcast assistant Strain: Not on file  Food Insecurity: Not on file  Transportation Needs: Not on file  Physical Activity: Not on file  Stress: Not on file  Social Connections: Not on file    Social: [place] to [facility], married; retired; went to [place] in 2022, going to [place] in 2023  Family History: The patient's family history includes COPD in his mother; Diabetes in his mother; Heart attack in his father, sister, and sister; Kidney disease in his mother. There is no history of Colon cancer, Colon polyps, Esophageal cancer, Rectal cancer, or Stomach cancer. History of coronary artery disease notable for sisters and father. History of heart failure notable for no members. History of arrhythmia notable for no members. Sister died young at the age of 34  (passed in her sleep). Brother has new ICD  ROS:   Please see the history of present illness.     All other systems reviewed and are negative.  EKGs/Labs/Other Studies Reviewed:    The following studies were reviewed today:  EKG:   3/923: SR rate 73 08/24/20: Sinus Bradycardia 1st HB 07/13/20: SR rate 69 1st Hb with Baseline artifact  Cardiac CT: Date 08/10/20 IMPRESSION: 1. Coronary calcium score of 1724. This was 90th percentile for age, sex, and race matched control.   2. Normal coronary origin with right dominance.   3. CAD-RADS 4 Severe stenosis. (70-99% or > 50% left main). There is left main, RCA, LAD, and LCX disease. Cardiac catheterization or CT FFR is recommended. Consider symptom-guided anti-ischemic pharmacotherapy as well as risk factor modification per guideline directed care.   4.  Left main appears aneurysmal: Ostial diameter measured at 16 mm.   5. Upper limits of normal ascending aortic size 39 mm, with ascending aortic height index 2.10 cm/m.   6.  Aortic atherosclerosis noted.   7.  Mild aortic valve calcification and mild annular calcification.    Left/Right Heart Catheterizations: Date: 08/16/20 Results: Conclusions: Multivessel coronary artery disease, as detailed below.  Most severe lesion is a 95% stenosis involving the apical LAD.  There are also 50% stenoses involving the proximal LAD and small D1 branch, as well as 40% lPL1 stenosis and moderate diffuse disease involving the RCA. Low normal left ventricular systolic function (LVEF 07-37%) with mildly elevated filling pressure (LVEDP ~20 mmHg).   Recommendations: Optimize medical therapy; 95% apical LAD stenosis is not well-suited for PCI given distal location and vessel size.  Will add metoprolol tartrate 25 mg twice daily. Aggressive secondary prevention of coronary artery disease.   Recent Labs: No results found for requested labs within last 365 days.  Recent Lipid Panel    Component  Value Date/Time   CHOL 116 11/23/2020 0811   TRIG 73 11/23/2020 0811   HDL 52 11/23/2020 0811   CHOLHDL 2.2 11/23/2020 0811   LDLCALC 49 11/23/2020 0811    Physical Exam:    VS:  BP 111/75   Pulse 66   Ht '6\' 2"'$  (1.88 m)   Wt 187 lb (84.8 kg)   SpO2 97%   BMI 24.01 kg/m     Wt Readings from Last 3 Encounters:  03/08/22 187 lb (84.8 kg)  12/15/21 189 lb 4.8 oz (85.9 kg)  11/04/21 188 lb (85.3 kg)    Gen: no distress   Neck: No JVD Cardiac: No Rubs or Gallops, no murmur, RRR, +2 radial pulses Respiratory: Clear to auscultation bilaterally, normal effort, normal  respiratory rate GI: Soft, nontender, non-distended  MS: No  edema;  moves all extremities Integument: Skin feels warm, stable left legt wound  from skin lesion removal with minimal leg swelling Neuro:  At time of evaluation, alert and oriented to person/place/time/situation Psych: Normal affect, patient feels well   ASSESSMENT:    1. Coronary artery disease involving native coronary artery of native heart with unstable angina pectoris ([place])   2. Aortic atherosclerosis ([place])   3. Mixed hyperlipidemia   4. Essential hypertension     PLAN:    Coronary Artery Disease; Obstructive Presyncope Hyperlipidemia (mixed) Aortic Atherosclerosis HTN FHX of VT younger brother - worsening in his angina - his dizziness is likely related to his metoprolol; but he is also having angina; IMDUR would make this worse - starting PRN nitro - starting ranolazine 500 mg PO BID - given FH, will get non live ziopatch and echo - anatomy: apical disease with non obstructive LAD and D1 by cath - continue ASA 81 mg - on rosuvastatin to 40 mg, goal LDL < 70  Two to three months with me; may need PCI attempt     Medication Adjustments/Labs and Tests Ordered: Current medicines are reviewed at length with the patient today.  Concerns regarding medicines are outlined above.  No orders of the defined types were placed in this  encounter.   Meds ordered this encounter  Medications   metoprolol tartrate (LOPRESSOR) 25 MG tablet    Sig: Take 0.5 tablets (12.5 mg total) by mouth 2 (two) times daily.    Dispense:  90 tablet    Refill:  3   nitroGLYCERIN (NITROSTAT) 0.4 MG SL tablet    Sig: Place 1 tablet (0.4 mg total) under the tongue every 5 (five) minutes as needed for chest pain.    Dispense:  25 tablet    Refill:  3   ranolazine (RANEXA) 500 MG 12 hr tablet    Sig: Take 1 tablet (500 mg total) by mouth 2 (two) times daily.    Dispense:  180 tablet    Refill:  3     Patient Instructions  Medication Instructions:  Your physician has recommended you make the following change in your medication:  START: ranolazine (Ranexa) 500 mg by mouth twice daily START: Nitroglycerin 0.4 mg under your tongue as needed for Chest Pain  If you have chest pain place 1 tablet under your tongue, wait 5 min If chest pain continues place a 2nd tablet under your tongue, wait 5 min If chest pain continues place a 3rd tablet under your tongue, wait 5 min If chest pain continues call 911    REFILLED: metoprolol tartrate (Lopressor)   *If you need a refill on your cardiac medications before your next appointment, please call your pharmacy*   Lab Work: NONE If you have labs (blood work) drawn today and your tests are completely normal, you will receive your results only by: [place] (if you have MyChart) OR A paper copy in the mail If you have any lab test that is abnormal or we need to change your treatment, we will call you to review the results.   Testing/Procedures: NONE   Follow-Up: At [facility], you and your health needs are our priority.  As part of our continuing mission to provide you with exceptional heart care, we have created designated Provider Care Teams.  These Care Teams include your primary Cardiologist (physician) and Advanced Practice Providers (APPs -  Physician Assistants and Nurse  Practitioners) who all work together to provide you with the care you need, when you need it.   Your next appointment:  2-3 month(s)  The format for your next appointment:   In Person  Provider:   Werner Lean, MD     Other Instructions   Important Information About Sugar         Signed, Werner Lean, MD  03/08/2022 9:57 AM    [place]

## 2022-03-08 ENCOUNTER — Encounter: Payer: Self-pay | Admitting: Internal Medicine

## 2022-03-08 ENCOUNTER — Ambulatory Visit: Payer: Medicare Other | Attending: Internal Medicine | Admitting: Internal Medicine

## 2022-03-08 VITALS — BP 111/75 | HR 66 | Ht 74.0 in | Wt 187.0 lb

## 2022-03-08 DIAGNOSIS — I700 Atherosclerosis of aorta: Secondary | ICD-10-CM | POA: Insufficient documentation

## 2022-03-08 DIAGNOSIS — I25110 Atherosclerotic heart disease of native coronary artery with unstable angina pectoris: Secondary | ICD-10-CM | POA: Diagnosis not present

## 2022-03-08 DIAGNOSIS — I10 Essential (primary) hypertension: Secondary | ICD-10-CM | POA: Diagnosis not present

## 2022-03-08 DIAGNOSIS — E782 Mixed hyperlipidemia: Secondary | ICD-10-CM | POA: Diagnosis not present

## 2022-03-08 MED ORDER — NITROGLYCERIN 0.4 MG SL SUBL: 0.4000 mg | SUBLINGUAL_TABLET | SUBLINGUAL | 3 refills | Status: AC | PRN

## 2022-03-08 MED ORDER — RANOLAZINE ER 500 MG PO TB12: 500.0000 mg | ORAL_TABLET | Freq: Two times a day (BID) | ORAL | 3 refills | Status: DC

## 2022-03-08 MED ORDER — METOPROLOL TARTRATE 25 MG PO TABS: 12.5000 mg | ORAL_TABLET | Freq: Two times a day (BID) | ORAL | 3 refills | Status: DC

## 2022-03-08 NOTE — Patient Instructions (Addendum)
Medication Instructions:  Your physician has recommended you make the following change in your medication:  START: ranolazine (Ranexa) 500 mg by mouth twice daily START: Nitroglycerin 0.4 mg under your tongue as needed for Chest Pain  If you have chest pain place 1 tablet under your tongue, wait 5 min If chest pain continues place a 2nd tablet under your tongue, wait 5 min If chest pain continues place a 3rd tablet under your tongue, wait 5 min If chest pain continues call 911    REFILLED: metoprolol tartrate (Lopressor)   *If you need a refill on your cardiac medications before your next appointment, please call your pharmacy*   Lab Work: NONE If you have labs (blood work) drawn today and your tests are completely normal, you will receive your results only by: [place] (if you have MyChart) OR A paper copy in the mail If you have any lab test that is abnormal or we need to change your treatment, we will call you to review the results.   Testing/Procedures: NONE   Follow-Up: At [facility], you and your health needs are our priority.  As part of our continuing mission to provide you with exceptional heart care, we have created designated Provider Care Teams.  These Care Teams include your primary Cardiologist (physician) and Advanced Practice Providers (APPs -  Physician Assistants and Nurse Practitioners) who all work together to provide you with the care you need, when you need it.   Your next appointment:   2-3 month(s)  The format for your next appointment:   In Person  Provider:   Werner Lean, MD     Other Instructions   Important Information About Sugar

## 2022-03-09 ENCOUNTER — Ambulatory Visit
Admission: RE | Admit: 2022-03-09 | Discharge: 2022-03-09 | Disposition: A | Discharge status: Home / Self Care | Payer: Medicare Other | Source: Ambulatory Visit | Attending: Internal Medicine | Admitting: Internal Medicine

## 2022-03-09 DIAGNOSIS — I6782 Cerebral ischemia: Secondary | ICD-10-CM | POA: Diagnosis not present

## 2022-03-09 DIAGNOSIS — G319 Degenerative disease of nervous system, unspecified: Secondary | ICD-10-CM | POA: Diagnosis not present

## 2022-03-09 DIAGNOSIS — I6381 Other cerebral infarction due to occlusion or stenosis of small artery: Secondary | ICD-10-CM | POA: Diagnosis not present

## 2022-03-09 DIAGNOSIS — R937 Abnormal findings on diagnostic imaging of other parts of musculoskeletal system: Secondary | ICD-10-CM

## 2022-03-09 DIAGNOSIS — C719 Malignant neoplasm of brain, unspecified: Secondary | ICD-10-CM | POA: Diagnosis not present

## 2022-03-09 MED ORDER — GADOBENATE DIMEGLUMINE 529 MG/ML IV SOLN
17.0000 mL | Freq: Once | INTRAVENOUS | Status: AC | PRN
  Administered 2022-03-09: 17 mL via INTRAVENOUS

## 2022-03-13 ENCOUNTER — Inpatient Hospital Stay: Payer: Medicare Other

## 2022-03-13 ENCOUNTER — Inpatient Hospital Stay: Payer: Medicare Other | Attending: Internal Medicine | Admitting: Internal Medicine

## 2022-03-13 DIAGNOSIS — R937 Abnormal findings on diagnostic imaging of other parts of musculoskeletal system: Secondary | ICD-10-CM

## 2022-03-13 NOTE — Progress Notes (Signed)
I connected with Jeffery Kirby on 03/13/22 at 12:00 PM EDT by telephone visit and verified that I am speaking with the correct person using two identifiers.  I discussed the limitations, risks, security and privacy concerns of performing an evaluation and management service by telemedicine and the availability of in-person appointments. I also discussed with the patient that there may be a patient responsible charge related to this service. The patient expressed understanding and agreed to proceed.  Other persons participating in the visit and their role in the encounter:  n/a  Patient's location:  Home Provider's location:  Office Chief Complaint:  Abnormal magnetic resonance imaging of cervical spine  History of Present Ilness: Jeffery Kirby reports no clinical changes today.  No issues with his brain scan.  Recently returned from 3 week trip to [place].  Observations: Language and cognition at baseline  Imaging:  [facility] Clinician Interpretation: I have personally reviewed the CNS images as listed.  My interpretation, in the context of the patient's clinical presentation, is stable disease  MR BRAIN W WO CONTRAST  Result Date: 03/11/2022 CLINICAL DATA:  Provided history: Abnormal magnetic resonance imaging of cervical spine. Brain/CNS neoplasm, assess treatment response. EXAM: MRI HEAD WITHOUT AND WITH CONTRAST TECHNIQUE: Multiplanar, multiecho pulse sequences of the brain and surrounding structures were obtained without and with intravenous contrast. CONTRAST:  55m MULTIHANCE GADOBENATE DIMEGLUMINE 529 MG/ML IV SOLN COMPARISON:  Prior brain MRI examinations 11/23/2021 and 11/01/2021. FINDINGS: Brain: Generalized cerebral atrophy. Mild chronic small vessel ischemic changes within the cerebral white matter, stable. This includes a small chronic lacunar infarct within the right corona radiata (series 12, image 22). There is no acute infarct. No chronic intracranial blood products. No extra-axial  fluid collection. No midline shift. No pathologic enhancement identified within the intracranial compartment. Vascular: Maintained flow voids within the proximal large arterial vessels. Skull and upper cervical spine: A 2.2 x 1.4 cm lesion within the central clivus has not significantly changed in size or appearance from the prior brain MRI examinations in May of 2023. As before, the lesion is predominantly T1 hypointense and T2 hyperintense, and the lesion demonstrates enhancement. Incompletely assessed cervical spondylosis. Sinuses/Orbits: No mass or acute finding within the imaged orbits. Prior bilateral ocular lens replacement. Minimal mucosal thickening within bilateral ethmoid sinuses. Tiny mucous retention cysts within the right maxillary sinus. IMPRESSION: 2.2 x 1.4 cm osseous lesion within the central clivus, stable from the prior brain MRI examinations of 11/23/2021 and 11/01/2021. Given the stability to date, this may reflect a benign lesion (such as a hemangioma). However, a 3-6 month follow-up MRI without and with contrast is recommended to ensure continued stability, and to exclude alternative etiologies (such as an osseous metastasis or chordoma). Electronically Signed   By: KKellie SimmeringD.O.   On: 03/11/2022 15:20     Assessment and Plan: Abnormal magnetic resonance imaging of cervical spine  Clinically and radiographically stable.  We did recommend one additional imaging study in 9 months.    If stable at that time no further follow up needed.  Follow Up Instructions: RTC after 9 month interval MRI brain.  I discussed the assessment and treatment plan with the patient.  The patient was provided an opportunity to ask questions and all were answered.  The patient agreed with the plan and demonstrated understanding of the instructions.    The patient was advised to call back or seek an in-person evaluation if the symptoms worsen or if the condition fails to improve as anticipated.  Ventura Sellers, MD   I provided 15 minutes of non face-to-face telephone visit time during this encounter, and > 50% was spent counseling as documented under my assessment & plan.

## 2022-03-14 ENCOUNTER — Other Ambulatory Visit: Payer: Self-pay | Admitting: Radiation Therapy

## 2022-04-17 ENCOUNTER — Other Ambulatory Visit (HOSPITAL_BASED_OUTPATIENT_CLINIC_OR_DEPARTMENT_OTHER): Payer: Self-pay

## 2022-04-17 DIAGNOSIS — Z23 Encounter for immunization: Secondary | ICD-10-CM | POA: Diagnosis not present

## 2022-04-17 MED ORDER — COVID-19 MRNA 2023-2024 VACCINE (COMIRNATY) 0.3 ML INJECTION
INTRAMUSCULAR | 0 refills | Status: DC
  Filled 2022-04-17: qty 0.3, 1d supply, fill #0

## 2022-04-18 ENCOUNTER — Telehealth: Payer: Self-pay | Admitting: Internal Medicine

## 2022-04-18 MED ORDER — RANOLAZINE ER 500 MG PO TB12: 1000.0000 mg | ORAL_TABLET | Freq: Two times a day (BID) | ORAL | 3 refills | Status: DC

## 2022-04-18 NOTE — Telephone Encounter (Signed)
Pt called to report that he woke up with chest tightness, worse than usual, he was having worsening dizziness not just with position changes... he did not have SOB but says he has been having a "very bad" morning and does not feel well..he took 2 nitro with some relief but his symptoms soon returned... he is still taking his Ranexa 500 mg bid..... he says standing up helps his symptoms but worsen with sitting and exertion... I talked to him about his symptoms and if not his typical problems and they seem to be worsened today he should highly consider going to the ED but he declined and asked to have Dr Gasper Sells review his note.   I advised him to aconitum to monitor his symptoms and fi anything a changes to please have his wife tale him to the ED or call EMS.. I will send a message to Dr Gasper Sells for his recommendations.      From his 03/08/22 OV:  He has progressively had worsening fatigue. He had new dizziness and lightheadedness with standing. Is having new tightness and heaviness with his chest. Also gets tightness when eating.

## 2022-04-18 NOTE — Telephone Encounter (Signed)
Per Dr Gasper Sells:   Increase his Ranexa to 1000.  Offer him a 48 hour acute (I am DOD on Thursday).  If escalating then we will have to send him to ED  Pt advised and verbalized understanding.   Appt 04/20/22 at 10:30 am.

## 2022-04-18 NOTE — Telephone Encounter (Signed)
Patient called and said that medication ranolazine (RANEXA) 500 MG 12 hr tablet and another medication he takes makes him extremely dizzy. Wants to talk with Dr. Gasper Sells or nurse about it.

## 2022-04-20 ENCOUNTER — Encounter: Payer: Self-pay | Admitting: Internal Medicine

## 2022-04-20 ENCOUNTER — Ambulatory Visit: Payer: Medicare Other | Attending: Internal Medicine | Admitting: Internal Medicine

## 2022-04-20 VITALS — BP 110/62 | HR 60 | Ht 74.0 in | Wt 190.0 lb

## 2022-04-20 DIAGNOSIS — I700 Atherosclerosis of aorta: Secondary | ICD-10-CM | POA: Diagnosis not present

## 2022-04-20 DIAGNOSIS — I10 Essential (primary) hypertension: Secondary | ICD-10-CM | POA: Diagnosis not present

## 2022-04-20 DIAGNOSIS — E782 Mixed hyperlipidemia: Secondary | ICD-10-CM

## 2022-04-20 DIAGNOSIS — I25110 Atherosclerotic heart disease of native coronary artery with unstable angina pectoris: Secondary | ICD-10-CM | POA: Diagnosis not present

## 2022-04-20 LAB — BASIC METABOLIC PANEL
BUN/Creatinine Ratio: 15 (ref 10–24)
BUN: 17 mg/dL (ref 8–27)
CO2: 27 mmol/L (ref 20–29)
Calcium: 9.2 mg/dL (ref 8.6–10.2)
Chloride: 107 mmol/L — ABNORMAL HIGH (ref 96–106)
Creatinine, Ser: 1.1 mg/dL (ref 0.76–1.27)
Glucose: 103 mg/dL — ABNORMAL HIGH (ref 70–99)
Potassium: 5.5 mmol/L — ABNORMAL HIGH (ref 3.5–5.2)
Sodium: 139 mmol/L (ref 134–144)
eGFR: 70 mL/min/{1.73_m2} (ref >59)

## 2022-04-20 LAB — CBC
Hematocrit: 42.2 % (ref 37.5–51.0)
Hemoglobin: 14.4 g/dL (ref 13.0–17.7)
MCH: 30.4 pg (ref 26.6–33.0)
MCHC: 34.1 g/dL (ref 31.5–35.7)
MCV: 89 fL (ref 79–97)
Platelets: 141 10*3/uL — ABNORMAL LOW (ref 150–450)
RBC: 4.74 x10E6/uL (ref 4.14–5.80)
RDW: 14 % (ref 11.6–15.4)
WBC: 6.2 10*3/uL (ref 3.4–10.8)

## 2022-04-20 MED ORDER — SODIUM CHLORIDE 0.9% FLUSH: 3.0000 mL | Freq: Two times a day (BID) | INTRAVENOUS | Status: DC

## 2022-04-20 MED ORDER — PANTOPRAZOLE SODIUM 40 MG PO TBEC: 40.0000 mg | DELAYED_RELEASE_TABLET | Freq: Every day | ORAL | 11 refills | Status: DC

## 2022-04-20 MED ORDER — RANOLAZINE ER 500 MG PO TB12: 500.0000 mg | ORAL_TABLET | Freq: Two times a day (BID) | ORAL | 3 refills | Status: DC

## 2022-04-20 NOTE — Patient Instructions (Addendum)
Medication Instructions:  Your physician has recommended you make the following change in your medication:  DECREASE: ranolazine (Ranexa) to 500 mg by mouth twice daily  START: pantoprazole (Protonix) 40 mg by mouth once daily   *If you need a refill on your cardiac medications before your next appointment, please call your pharmacy*   Lab Work: TODAY: BMP, CBC  If you have labs (blood work) drawn today and your tests are completely normal, you will receive your results only by: [place] (if you have MyChart) OR A paper copy in the mail If you have any lab test that is abnormal or we need to change your treatment, we will call you to review the results.   Testing/Procedures: Your physician has requested that you have a cardiac catheterization. Cardiac catheterization is used to diagnose and/or treat various heart conditions. Doctors may recommend this procedure for a number of different reasons. The most common reason is to evaluate chest pain. Chest pain can be a symptom of coronary artery disease (CAD), and cardiac catheterization can show whether plaque is narrowing or blocking your heart's arteries. This procedure is also used to evaluate the valves, as well as measure the blood flow and oxygen levels in different parts of your heart. For further information please visit [email or web address]. Please follow instruction sheet, as given.    Follow-Up: At [facility], you and your health needs are our priority.  As part of our continuing mission to provide you with exceptional heart care, we have created designated Provider Care Teams.  These Care Teams include your primary Cardiologist (physician) and Advanced Practice Providers (APPs -  Physician Assistants and Nurse Practitioners) who all work together to provide you with the care you need, when you need it.   Your next appointment:   6 month(s)  The format for your next appointment:   In Person  Provider:    Werner Lean, MD     Other Instructions   Cardiac/Peripheral Catheterization   You are scheduled for a Cardiac Catheterization on Tuesday, October 24 with Dr. Larae Grooms.  1. Please arrive at the Main Entrance A at [facility]: [place], [place] [address] on October 24 at 8:30 AM (This time is two hours before your procedure to ensure your preparation). Free valet parking service is available. You will check in at ADMITTING. The support person will be asked to wait in the waiting room.  It is OK to have someone drop you off and come back when you are ready to be discharged.        Special note: Every effort is made to have your procedure done on time. Please understand that emergencies sometimes delay scheduled procedures.   . 2. Diet: Do not eat solid foods after midnight.  You may have clear liquids until 5 AM the day of the procedure.  3. Labs: You will need to have blood drawn on TODAY.  4. Medication instructions in preparation for your procedure:   Contrast Allergy: No   On the morning of your procedure, take Aspirin 81 mg and any morning medicines NOT listed above.  You may use sips of water.  5. Plan to go home the same day, you will only stay overnight if medically necessary. 6. You MUST have a responsible adult to drive you home. 7. An adult MUST be with you the first 24 hours after you arrive home. 8. Bring a current list of your medications, and the last time  and date medication taken. 9. Bring ID and current insurance cards. 10.Please wear clothes that are easy to get on and off and wear slip-on shoes.  Thank you for allowing [place] to care for you!   -- [facility]   Important Information About Sugar

## 2022-04-20 NOTE — Progress Notes (Signed)
Cardiology Office Note:    Date:  04/20/2022   ID:  Jeffery Kirby, DOB 19-Jan-1946, MRN [id number]  PCP:  Ginger Organ., MD  [facility] Cardiologist: Rudean Haskell MD [place] Electrophysiologist:  None   CC: Angina DOD  History of Present Illness:    Jeffery Kirby is a 76 y.o. male with a HTN and HLD who presented for evaluation 07/13/20.  2022: Cardiac CT which showed multi-vessel disease; patient DEY:CXKGYJE non obstructive disease save for with obstructive apical disease. With medical therapy, lightheaded on BB.   2023: started dietary changes for CAD secondary prevention. Has worsening chest pain since September visit and is back as DOD for CP.  Patient notes that he is having worsening chest pain.   He cannot do much because of orthstatis: can't do step or walk up a hill. Has taken then nitroglycerin, improved by did not resolve chest pain.   Past Medical History:  Diagnosis Date   Angina pectoris ([place])    Cataract    Exertional chest pain    Hyperlipidemia    Hypertension    Shingles 2012   Tinnitus     Past Surgical History:  Procedure Laterality Date   COLONOSCOPY     LEFT HEART CATH AND CORONARY ANGIOGRAPHY N/A 08/16/2020   Procedure: LEFT HEART CATH AND CORONARY ANGIOGRAPHY;  Surgeon: Nelva Bush, MD;  Location: [facility];  Service: Cardiovascular;  Laterality: N/A;   ROTATOR CUFF REPAIR Bilateral    wisdom teeth      Current Medications: Current Meds  Medication Sig   aspirin EC 81 MG tablet Take 1 tablet (81 mg total) by mouth daily. Swallow whole.   metoprolol tartrate (LOPRESSOR) 25 MG tablet Take 0.5 tablets (12.5 mg total) by mouth 2 (two) times daily.   naproxen (NAPROSYN) 500 MG tablet Take 500 mg by mouth as needed for mild pain or moderate pain.   nitroGLYCERIN (NITROSTAT) 0.4 MG SL tablet Place 1 tablet (0.4 mg total) under the tongue every 5 (five) minutes as needed for chest pain.   ranolazine (RANEXA)  500 MG 12 hr tablet Take 2 tablets (1,000 mg total) by mouth 2 (two) times daily.   rosuvastatin (CRESTOR) 40 MG tablet Take 1 tablet (40 mg total) by mouth daily.   vitamin B-12 (CYANOCOBALAMIN) 1000 MCG tablet Take 1,000 mcg by mouth 2 (two) times a week.   [DISCONTINUED] COVID-19 mRNA vaccine 2023-2024 (COMIRNATY) SUSP injection Inject into the muscle.   Current Facility-Administered Medications for the 04/20/22 encounter (Office Visit) with Werner Lean, MD  Medication   sodium chloride flush (NS) 0.9 % injection 3 mL     Allergies:   Patient has no known allergies.   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  Tobacco Use   Smoking status: Never   Smokeless tobacco: Never  Vaping Use   Vaping Use: Never used  Substance and Sexual Activity   Alcohol use: Yes    Comment: occasional beer   Drug use: Not Currently   Sexual activity: Not on file  Other Topics Concern   Not on file  Social History Narrative   Right handed   Lives with wife  Retired   Investment banker, operational of Radio broadcast assistant Strain: Not on [employer] Insecurity: Not on file  Transportation Needs: Not on file  Physical Activity: Not on file  Stress: Not on file  Social Connections: Not on file    Social: Went to [employer], married; retired; went to [place] in 2022, went to [place] in 2023  Family History: The patient's family history includes COPD in his mother; Diabetes in his mother; Heart attack in his father, sister, and sister; Kidney disease in his mother. There is no history of Colon cancer, Colon polyps, Esophageal cancer, Rectal cancer, or Stomach cancer. History of coronary artery disease notable for sisters and  father. History of heart failure notable for no members. History of arrhythmia notable for no members. Sister died young at the age of 60 (passed in her sleep). Brother has new ICD  ROS:   Please see the history of present illness.     All other systems reviewed and are negative.  EKGs/Labs/Other Studies Reviewed:    The following studies were reviewed today:  EKG:   04/20/22: SR with 1st HB rate 60 3/923: SR rate 73 08/24/20: Sinus Bradycardia 1st HB 07/13/20: SR rate 69 1st Hb with Baseline artifact  LEFT HEART CATH AND CORONARY ANGIOGRAPHY 08/16/2020  Narrative Conclusions: 1. Multivessel coronary artery disease, as detailed below.  Most severe lesion is a 95% stenosis involving the apical LAD.  There are also 50% stenoses involving the proximal LAD and small D1 branch, as well as 40% lPL1 stenosis and moderate diffuse disease involving the RCA. 2. Low normal left ventricular systolic function (LVEF 25-05%) with mildly elevated filling pressure (LVEDP ~20 mmHg).  Recommendations: 1. Optimize medical therapy; 95% apical LAD stenosis is not well-suited for PCI given distal location and vessel size.  Will add metoprolol tartrate 25 mg twice daily. 2. Aggressive secondary prevention of coronary artery disease.    CT CORONARY MORPH W/CTA COR W/SCORE W/CA W/CM &/OR WO/CM 08/10/2020  Addendum 08/10/2020 12:55 PM ADDENDUM REPORT: 08/10/2020 12:52  CLINICAL DATA:  76 Year-old White Male  EXAM: Cardiac/Coronary CTA  TECHNIQUE: The patient was scanned on a [employer].  FINDINGS: A 100 kV prospective scan was triggered in the descending thoracic aorta at 111 HU's. Axial non-contrast 3 mm slices were carried out through the heart. The data set was analyzed on a dedicated work station and scored using the Duncan. Gantry rotation speed was 250 msecs and collimation was .6 mm. No beta blockade and 0.8 mg of sl NTG was given. The 3D data set was reconstructed in  5% intervals of the 67-82 % of the R-R cycle. Diastolic phases were analyzed on a dedicated work station using MPR, MIP and VRT modes. The patient received 100 cc of contrast.  Aorta: Upper limits of normal; ascending aortic size 39 mm, with ascending aortic height index 2.10 cm/m. Descending aortic atherosclerosis noted. No dissection.  Aortic Valve: Tri-leaflet. Multiple small annular calcifications noted. Mild valvular calcification.  Coronary Arteries:  Normal coronary origin.  Right dominance.  Coronary calcium score of 1724. This was 90th percentile for age, sex, and race matched control.  RCA is a large dominant artery that gives rise to PDA and PLA. There is a 70-99% mixed plaque stenosis, with a subsequent 50-70% mixed plaque stenosis in the proximal vessel, there is a 50-70% mixed plaque stenosis in the mid vessel, there is a 70-90% calcified plaque stenosis in the distal vessel. In addition there  are minimal non-obstructive calcified plaques scattered through the mid/distal vessel and its branches.  Left main is a large artery that gives rise to LAD and LCX arteries. The ostia of the left main appear aneurysmal measuring 16 mm with mild calcification. This narrows to 5 mm by the distal vessel. There is 30% distal stenosis with a calcified plaque with napkin ring sign (high risk feature).  LAD is a large vessel that gives off two diagonal vessels. There is an ostial 50-70% calcified stenosis narrowing to a 70-99% mixed plaque stenosis in the proximal vessel with 50-70% calcified plaque stenosis in the mid vessel. There is a mild non obstructive (25-49%) calcified plaque in the ostium of the D1 vessel. There is a mild non obstructive (25-49%) calcified plaque in the ostium of the distal vessel.  LCX is a non-dominant artery that gives rise to four obtuse marginal vessels including a large OM4 branch that bifurcates. There is moderate (50-70%) calcified stenosis in  the proximal vessel with multiple minimal non obstructive (1-24%) calcified plaques in the mid and distant vessel. There is a minimal non obstructive (1-24%) calcified plaque in the of the OM1 vessel. There is a minimal non obstructive (1-24%) soft plaque in the of the OM3 vessel. There is a mild non obstructive (25-49%) calcified plaque in the OM4 vessel prior to the bifurcation.  Other findings:  Normal pulmonary vein drainage into the left atrium.  Normal left atrial appendage without a thrombus.  Normal size of the pulmonary artery.  Extra-cardiac findings: See attached radiology report for non-cardiac structures.  IMPRESSION: 1. Coronary calcium score of 1724. This was 90th percentile for age, sex, and race matched control.  2. Normal coronary origin with right dominance.  3. CAD-RADS 4 Severe stenosis. (70-99% or > 50% left main). There is left main, RCA, LAD, and LCX disease. Cardiac catheterization or CT FFR is recommended. Consider symptom-guided anti-ischemic pharmacotherapy as well as risk factor modification per guideline directed care.  4.  Left main appears aneurysmal: Ostial diameter measured at 16 mm.  5. Upper limits of normal ascending aortic size 39 mm, with ascending aortic height index 2.10 cm/m.  6.  Aortic atherosclerosis noted.  7.  Mild aortic valve calcification and mild annular calcification.   Electronically Signed By: Rudean Haskell MD On: 08/10/2020 12:52  Narrative EXAM: OVER-READ INTERPRETATION  CT CHEST  The following report is an over-read performed by radiologist Dr. Aletta Edouard of [facility], [place] on 08/10/2020. This over-read does not include interpretation of cardiac or coronary anatomy or pathology. The coronary CTA interpretation by the cardiologist is attached.  COMPARISON:  None.  FINDINGS: Vascular: No significant vascular findings. Normal heart size. No pericardial effusion.  Mediastinum/Nodes:  Visualized mediastinum and hilar regions demonstrate calcified right hilar and subcarinal lymph nodes consistent with prior granulomatous disease.  Lungs/Pleura: Visualized lungs show no evidence of pulmonary edema, consolidation, pneumothorax, nodule or pleural fluid.  Upper Abdomen: No acute abnormality.  Musculoskeletal: No chest wall mass or suspicious bone lesions identified.  IMPRESSION: Calcified right hilar and subcarinal lymph nodes consistent with prior granulomatous disease.  Electronically Signed: By: Aletta Edouard M.D. On: 08/10/2020 11:17     Recent Labs: No results found for requested labs within last 365 days.  Recent Lipid Panel    Component Value Date/Time   CHOL 116 11/23/2020 0811   TRIG 73 11/23/2020 0811   HDL 52 11/23/2020 0811   CHOLHDL 2.2 11/23/2020 0811   LDLCALC 49 11/23/2020 8119  Physical Exam:    VS:  BP 110/62   Pulse 60   Ht '6\' 2"'$  (1.88 m)   Wt 190 lb (86.2 kg)   SpO2 95%   BMI 24.39 kg/m     Wt Readings from Last 3 Encounters:  04/20/22 190 lb (86.2 kg)  03/08/22 187 lb (84.8 kg)  12/15/21 189 lb 4.8 oz (85.9 kg)    Gen: no distress   Neck: No JVD Cardiac: No Rubs or Gallops, no murmur, RRR, +2 radial pulses Respiratory: Clear to auscultation bilaterally, normal effort, normal  respiratory rate GI: Soft, nontender, non-distended  MS: No  edema;  moves all extremities Integument: Skin feels warm, stable left legt wound from skin lesion removal with minimal leg swelling Neuro:  At time of evaluation, alert and oriented to person/place/time/situation Psych: Normal affect, patient feels well  ASSESSMENT:    1. Coronary artery disease involving native coronary artery of native heart with unstable angina pectoris ([place])   2. Aortic atherosclerosis ([place])   3. Mixed hyperlipidemia   4. Essential hypertension     PLAN:    Coronary Artery Disease; Obstructive Presyncope Hyperlipidemia (mixed) Aortic  Atherosclerosis HTN FHX of VT younger brother - worsening  angina - has not tolerated IMDUR, cannot tolerate more than 25 mg of metoprolol for heart rate (orthostatis) - has  PRN nitro - pain persists despite  ranolazine 1000, it has made him more dizzy and we will come down to 500 mg PO  - anatomy: apical disease with non obstructive LAD and D1 by cath (likely not intervenable) - continue ASA 81 mg - on rosuvastatin to 40 mg, goal LDL < 70 - will start PPI; he rarely also described burning chest pain  Risks and benefits of cardiac catheterization have been discussed with the patient.  These include bleeding, infection, kidney damage, stroke, heart attack, death.  The patient understands these risks and is willing to proceed.  Access recommendations: R radial  Procedural considerations he is aware we may not have great targets  Would proceed to [facility] If he is unable to be intervened upon, he has no room for medical management as he is not tolerating his present medical management If he does not have a target we will have to discuss CBT and supplements or other non FDA approved therapies as well as their limitations  At next visit LDL goal 55 <   Time Spent Directly with Patient:   I have spent a total of 40 minutes with the patient reviewing notes, imaging, EKGs, labs and examining the patient as well as establishing an assessment and plan that was discussed personally with the patient.  > 50% of time was spent in direct patient care and family.   Medication Adjustments/Labs and Tests Ordered: Current medicines are reviewed at length with the patient today.  Concerns regarding medicines are outlined above.  No orders of the defined types were placed in this encounter.   Meds ordered this encounter  Medications   sodium chloride flush (NS) 0.9 % injection 3 mL     Patient Instructions  Medication Instructions:  Your physician has recommended you make the following change in your  medication:  DECREASE: ranolazine (Ranexa) to 500 mg by mouth twice daily   *If you need a refill on your cardiac medications before your next appointment, please call your pharmacy*   Lab Work: TODAY: BMP, CBC  If you have labs (blood work) drawn today and your tests are completely normal, you will  receive your results only by: [place] (if you have MyChart) OR A paper copy in the mail If you have any lab test that is abnormal or we need to change your treatment, we will call you to review the results.   Testing/Procedures: Your physician has requested that you have a cardiac catheterization. Cardiac catheterization is used to diagnose and/or treat various heart conditions. Doctors may recommend this procedure for a number of different reasons. The most common reason is to evaluate chest pain. Chest pain can be a symptom of coronary artery disease (CAD), and cardiac catheterization can show whether plaque is narrowing or blocking your heart's arteries. This procedure is also used to evaluate the valves, as well as measure the blood flow and oxygen levels in different parts of your heart. For further information please visit [email or web address][employer][email or web address]. Please follow instruction sheet, as given.    Follow-Up: At [facility], you and your health needs are our priority.  As part of our continuing mission to provide you with exceptional heart care, we have created designated Provider Care Teams.  These Care Teams include your primary Cardiologist (physician) and Advanced Practice Providers (APPs -  Physician Assistants and Nurse Practitioners) who all work together to provide you with the care you need, when you need it.   Your next appointment:   6 month(s)  The format for your next appointment:   In Person  Provider:   Werner Lean, MD     Other Instructions   Cardiac/Peripheral Catheterization   You are scheduled for a Cardiac Catheterization on Tuesday,  October 24 with Dr. Larae Grooms.  1. Please arrive at the Main Entrance A at [facility]: [place], [place] [address] on October 24 at 8:30 AM (This time is two hours before your procedure to ensure your preparation). Free valet parking service is available. You will check in at ADMITTING. The support person will be asked to wait in the waiting room.  It is OK to have someone drop you off and come back when you are ready to be discharged.        Special note: Every effort is made to have your procedure done on time. Please understand that emergencies sometimes delay scheduled procedures.   . 2. Diet: Do not eat solid foods after midnight.  You may have clear liquids until 5 AM the day of the procedure.  3. Labs: You will need to have blood drawn on TODAY.  4. Medication instructions in preparation for your procedure:   Contrast Allergy: No   On the morning of your procedure, take Aspirin 81 mg and any morning medicines NOT listed above.  You may use sips of water.  5. Plan to go home the same day, you will only stay overnight if medically necessary. 6. You MUST have a responsible adult to drive you home. 7. An adult MUST be with you the first 24 hours after you arrive home. 8. Bring a current list of your medications, and the last time and date medication taken. 9. Bring ID and current insurance cards. 10.Please wear clothes that are easy to get on and off and wear slip-on shoes.  Thank you for allowing [place] to care for you!   -- [facility]   Important Information About Sugar         Signed, Werner Lean, MD  04/20/2022 11:09 AM    [place]

## 2022-04-20 NOTE — H&P (View-Only) (Signed)
Cardiology Office Note:    Date:  04/20/2022   ID:  Jeffery Kirby, DOB May 27, 1946, MRN [id number]  PCP:  Ginger Organ., MD  [facility] Cardiologist: Rudean Haskell MD [place] Electrophysiologist:  None   CC: Angina DOD  History of Present Illness:    Jeffery Kirby is a 76 y.o. male with a HTN and HLD who presented for evaluation 07/13/20.  2022: Cardiac CT which showed multi-vessel disease; patient EPP:IRJJOAC non obstructive disease save for with obstructive apical disease. With medical therapy, lightheaded on BB.   2023: started dietary changes for CAD secondary prevention. Has worsening chest pain since September visit and is back as DOD for CP.  Patient notes that he is having worsening chest pain.   He cannot do much because of orthstatis: can't do step or walk up a hill. Has taken then nitroglycerin, improved by did not resolve chest pain.   Past Medical History:  Diagnosis Date   Angina pectoris ([place])    Cataract    Exertional chest pain    Hyperlipidemia    Hypertension    Shingles 2012   Tinnitus     Past Surgical History:  Procedure Laterality Date   COLONOSCOPY     LEFT HEART CATH AND CORONARY ANGIOGRAPHY N/A 08/16/2020   Procedure: LEFT HEART CATH AND CORONARY ANGIOGRAPHY;  Surgeon: Nelva Bush, MD;  Location: [facility];  Service: Cardiovascular;  Laterality: N/A;   ROTATOR CUFF REPAIR Bilateral    wisdom teeth      Current Medications: Current Meds  Medication Sig   aspirin EC 81 MG tablet Take 1 tablet (81 mg total) by mouth daily. Swallow whole.   metoprolol tartrate (LOPRESSOR) 25 MG tablet Take 0.5 tablets (12.5 mg total) by mouth 2 (two) times daily.   naproxen (NAPROSYN) 500 MG tablet Take 500 mg by mouth as needed for mild pain or moderate pain.   nitroGLYCERIN (NITROSTAT) 0.4 MG SL tablet Place 1 tablet (0.4 mg total) under the tongue every 5 (five) minutes as needed for chest pain.   ranolazine (RANEXA)  500 MG 12 hr tablet Take 2 tablets (1,000 mg total) by mouth 2 (two) times daily.   rosuvastatin (CRESTOR) 40 MG tablet Take 1 tablet (40 mg total) by mouth daily.   vitamin B-12 (CYANOCOBALAMIN) 1000 MCG tablet Take 1,000 mcg by mouth 2 (two) times a week.   [DISCONTINUED] COVID-19 mRNA vaccine 2023-2024 (COMIRNATY) SUSP injection Inject into the muscle.   Current Facility-Administered Medications for the 04/20/22 encounter (Office Visit) with Werner Lean, MD  Medication   sodium chloride flush (NS) 0.9 % injection 3 mL     Allergies:   Patient has no known allergies.   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  Tobacco Use   Smoking status: Never   Smokeless tobacco: Never  Vaping Use   Vaping Use: Never used  Substance and Sexual Activity   Alcohol use: Yes    Comment: occasional beer   Drug use: Not Currently   Sexual activity: Not on file  Other Topics Concern   Not on file  Social History Narrative   Right handed   Lives with wife  Retired   Investment banker, operational of Radio broadcast assistant Strain: Not on [employer] Insecurity: Not on file  Transportation Needs: Not on file  Physical Activity: Not on file  Stress: Not on file  Social Connections: Not on file    Social: Went to [employer], married; retired; went to [place] in 2022, went to [place] in 2023  Family History: The patient's family history includes COPD in his mother; Diabetes in his mother; Heart attack in his father, sister, and sister; Kidney disease in his mother. There is no history of Colon cancer, Colon polyps, Esophageal cancer, Rectal cancer, or Stomach cancer. History of coronary artery disease notable for sisters and  father. History of heart failure notable for no members. History of arrhythmia notable for no members. Sister died young at the age of 38 (passed in her sleep). Brother has new ICD  ROS:   Please see the history of present illness.     All other systems reviewed and are negative.  EKGs/Labs/Other Studies Reviewed:    The following studies were reviewed today:  EKG:   04/20/22: SR with 1st HB rate 60 3/923: SR rate 73 08/24/20: Sinus Bradycardia 1st HB 07/13/20: SR rate 69 1st Hb with Baseline artifact  LEFT HEART CATH AND CORONARY ANGIOGRAPHY 08/16/2020  Narrative Conclusions: 1. Multivessel coronary artery disease, as detailed below.  Most severe lesion is a 95% stenosis involving the apical LAD.  There are also 50% stenoses involving the proximal LAD and small D1 branch, as well as 40% lPL1 stenosis and moderate diffuse disease involving the RCA. 2. Low normal left ventricular systolic function (LVEF 60-45%) with mildly elevated filling pressure (LVEDP ~20 mmHg).  Recommendations: 1. Optimize medical therapy; 95% apical LAD stenosis is not well-suited for PCI given distal location and vessel size.  Will add metoprolol tartrate 25 mg twice daily. 2. Aggressive secondary prevention of coronary artery disease.    CT CORONARY MORPH W/CTA COR W/SCORE W/CA W/CM &/OR WO/CM 08/10/2020  Addendum 08/10/2020 12:55 PM ADDENDUM REPORT: 08/10/2020 12:52  CLINICAL DATA:  76 Year-old White Male  EXAM: Cardiac/Coronary CTA  TECHNIQUE: The patient was scanned on a [employer].  FINDINGS: A 100 kV prospective scan was triggered in the descending thoracic aorta at 111 HU's. Axial non-contrast 3 mm slices were carried out through the heart. The data set was analyzed on a dedicated work station and scored using the Post. Gantry rotation speed was 250 msecs and collimation was .6 mm. No beta blockade and 0.8 mg of sl NTG was given. The 3D data set was reconstructed in  5% intervals of the 67-82 % of the R-R cycle. Diastolic phases were analyzed on a dedicated work station using MPR, MIP and VRT modes. The patient received 100 cc of contrast.  Aorta: Upper limits of normal; ascending aortic size 39 mm, with ascending aortic height index 2.10 cm/m. Descending aortic atherosclerosis noted. No dissection.  Aortic Valve: Tri-leaflet. Multiple small annular calcifications noted. Mild valvular calcification.  Coronary Arteries:  Normal coronary origin.  Right dominance.  Coronary calcium score of 1724. This was 90th percentile for age, sex, and race matched control.  RCA is a large dominant artery that gives rise to PDA and PLA. There is a 70-99% mixed plaque stenosis, with a subsequent 50-70% mixed plaque stenosis in the proximal vessel, there is a 50-70% mixed plaque stenosis in the mid vessel, there is a 70-90% calcified plaque stenosis in the distal vessel. In addition there  are minimal non-obstructive calcified plaques scattered through the mid/distal vessel and its branches.  Left main is a large artery that gives rise to LAD and LCX arteries. The ostia of the left main appear aneurysmal measuring 16 mm with mild calcification. This narrows to 5 mm by the distal vessel. There is 30% distal stenosis with a calcified plaque with napkin ring sign (high risk feature).  LAD is a large vessel that gives off two diagonal vessels. There is an ostial 50-70% calcified stenosis narrowing to a 70-99% mixed plaque stenosis in the proximal vessel with 50-70% calcified plaque stenosis in the mid vessel. There is a mild non obstructive (25-49%) calcified plaque in the ostium of the D1 vessel. There is a mild non obstructive (25-49%) calcified plaque in the ostium of the distal vessel.  LCX is a non-dominant artery that gives rise to four obtuse marginal vessels including a large OM4 branch that bifurcates. There is moderate (50-70%) calcified stenosis in  the proximal vessel with multiple minimal non obstructive (1-24%) calcified plaques in the mid and distant vessel. There is a minimal non obstructive (1-24%) calcified plaque in the of the OM1 vessel. There is a minimal non obstructive (1-24%) soft plaque in the of the OM3 vessel. There is a mild non obstructive (25-49%) calcified plaque in the OM4 vessel prior to the bifurcation.  Other findings:  Normal pulmonary vein drainage into the left atrium.  Normal left atrial appendage without a thrombus.  Normal size of the pulmonary artery.  Extra-cardiac findings: See attached radiology report for non-cardiac structures.  IMPRESSION: 1. Coronary calcium score of 1724. This was 90th percentile for age, sex, and race matched control.  2. Normal coronary origin with right dominance.  3. CAD-RADS 4 Severe stenosis. (70-99% or > 50% left main). There is left main, RCA, LAD, and LCX disease. Cardiac catheterization or CT FFR is recommended. Consider symptom-guided anti-ischemic pharmacotherapy as well as risk factor modification per guideline directed care.  4.  Left main appears aneurysmal: Ostial diameter measured at 16 mm.  5. Upper limits of normal ascending aortic size 39 mm, with ascending aortic height index 2.10 cm/m.  6.  Aortic atherosclerosis noted.  7.  Mild aortic valve calcification and mild annular calcification.   Electronically Signed By: Rudean Haskell MD On: 08/10/2020 12:52  Narrative EXAM: OVER-READ INTERPRETATION  CT CHEST  The following report is an over-read performed by radiologist Dr. Aletta Edouard of [facility], [place] on 08/10/2020. This over-read does not include interpretation of cardiac or coronary anatomy or pathology. The coronary CTA interpretation by the cardiologist is attached.  COMPARISON:  None.  FINDINGS: Vascular: No significant vascular findings. Normal heart size. No pericardial effusion.  Mediastinum/Nodes:  Visualized mediastinum and hilar regions demonstrate calcified right hilar and subcarinal lymph nodes consistent with prior granulomatous disease.  Lungs/Pleura: Visualized lungs show no evidence of pulmonary edema, consolidation, pneumothorax, nodule or pleural fluid.  Upper Abdomen: No acute abnormality.  Musculoskeletal: No chest wall mass or suspicious bone lesions identified.  IMPRESSION: Calcified right hilar and subcarinal lymph nodes consistent with prior granulomatous disease.  Electronically Signed: By: Aletta Edouard M.D. On: 08/10/2020 11:17     Recent Labs: No results found for requested labs within last 365 days.  Recent Lipid Panel    Component Value Date/Time   CHOL 116 11/23/2020 0811   TRIG 73 11/23/2020 0811   HDL 52 11/23/2020 0811   CHOLHDL 2.2 11/23/2020 0811   LDLCALC 49 11/23/2020 5329  Physical Exam:    VS:  BP 110/62   Pulse 60   Ht '6\' 2"'$  (1.88 m)   Wt 190 lb (86.2 kg)   SpO2 95%   BMI 24.39 kg/m     Wt Readings from Last 3 Encounters:  04/20/22 190 lb (86.2 kg)  03/08/22 187 lb (84.8 kg)  12/15/21 189 lb 4.8 oz (85.9 kg)    Gen: no distress   Neck: No JVD Cardiac: No Rubs or Gallops, no murmur, RRR, +2 radial pulses Respiratory: Clear to auscultation bilaterally, normal effort, normal  respiratory rate GI: Soft, nontender, non-distended  MS: No  edema;  moves all extremities Integument: Skin feels warm, stable left legt wound from skin lesion removal with minimal leg swelling Neuro:  At time of evaluation, alert and oriented to person/place/time/situation Psych: Normal affect, patient feels well  ASSESSMENT:    1. Coronary artery disease involving native coronary artery of native heart with unstable angina pectoris ([place])   2. Aortic atherosclerosis ([place])   3. Mixed hyperlipidemia   4. Essential hypertension     PLAN:    Coronary Artery Disease; Obstructive Presyncope Hyperlipidemia (mixed) Aortic  Atherosclerosis HTN FHX of VT younger brother - worsening  angina - has not tolerated IMDUR, cannot tolerate more than 25 mg of metoprolol for heart rate (orthostatis) - has  PRN nitro - pain persists despite  ranolazine 1000, it has made him more dizzy and we will come down to 500 mg PO  - anatomy: apical disease with non obstructive LAD and D1 by cath (likely not intervenable) - continue ASA 81 mg - on rosuvastatin to 40 mg, goal LDL < 70 - will start PPI; he rarely also described burning chest pain  Risks and benefits of cardiac catheterization have been discussed with the patient.  These include bleeding, infection, kidney damage, stroke, heart attack, death.  The patient understands these risks and is willing to proceed.  Access recommendations: R radial  Procedural considerations he is aware we may not have great targets  Would proceed to [facility] If he is unable to be intervened upon, he has no room for medical management as he is not tolerating his present medical management If he does not have a target we will have to discuss CBT and supplements or other non FDA approved therapies as well as their limitations  At next visit LDL goal 55 <   Time Spent Directly with Patient:   I have spent a total of 40 minutes with the patient reviewing notes, imaging, EKGs, labs and examining the patient as well as establishing an assessment and plan that was discussed personally with the patient.  > 50% of time was spent in direct patient care and family.   Medication Adjustments/Labs and Tests Ordered: Current medicines are reviewed at length with the patient today.  Concerns regarding medicines are outlined above.  No orders of the defined types were placed in this encounter.   Meds ordered this encounter  Medications   sodium chloride flush (NS) 0.9 % injection 3 mL     Patient Instructions  Medication Instructions:  Your physician has recommended you make the following change in your  medication:  DECREASE: ranolazine (Ranexa) to 500 mg by mouth twice daily   *If you need a refill on your cardiac medications before your next appointment, please call your pharmacy*   Lab Work: TODAY: BMP, CBC  If you have labs (blood work) drawn today and your tests are completely normal, you will  receive your results only by: [facility]eb (if you have MyChart) OR A paper copy in the mail If you have any lab test that is abnormal or we need to change your treatment, we will call you to review the results.   Testing/Procedures: Your physician has requested that you have a cardiac catheterization. Cardiac catheterization is used to diagnose and/or treat various heart conditions. Doctors may recommend this procedure for a number of different reasons. The most common reason is to evaluate chest pain. Chest pain can be a symptom of coronary artery disease (CAD), and cardiac catheterization can show whether plaque is narrowing or blocking your heart's arteries. This procedure is also used to evaluate the valves, as well as measure the blood flow and oxygen levels in different parts of your heart. For further information please visit [email or web address].[email or web address]. Please follow instruction sheet, as given.    Follow-Up: At [facility], you and your health needs are our priority.  As part of our continuing mission to provide you with exceptional heart care, we have created designated Provider Care Teams.  These Care Teams include your primary Cardiologist (physician) and Advanced Practice Providers (APPs -  Physician Assistants and Nurse Practitioners) who all work together to provide you with the care you need, when you need it.   Your next appointment:   6 month(s)  The format for your next appointment:   In Person  Provider:   Werner Lean, MD     Other Instructions   Cardiac/Peripheral Catheterization   You are scheduled for a Cardiac Catheterization on Tuesday,  October 24 with Dr. Larae Grooms.  1. Please arrive at the Main Entrance A at [facility]: [place], [place] [address] on October 24 at 8:30 AM (This time is two hours before your procedure to ensure your preparation). Free valet parking service is available. You will check in at ADMITTING. The support person will be asked to wait in the waiting room.  It is OK to have someone drop you off and come back when you are ready to be discharged.        Special note: Every effort is made to have your procedure done on time. Please understand that emergencies sometimes delay scheduled procedures.   . 2. Diet: Do not eat solid foods after midnight.  You may have clear liquids until 5 AM the day of the procedure.  3. Labs: You will need to have blood drawn on TODAY.  4. Medication instructions in preparation for your procedure:   Contrast Allergy: No   On the morning of your procedure, take Aspirin 81 mg and any morning medicines NOT listed above.  You may use sips of water.  5. Plan to go home the same day, you will only stay overnight if medically necessary. 6. You MUST have a responsible adult to drive you home. 7. An adult MUST be with you the first 24 hours after you arrive home. 8. Bring a current list of your medications, and the last time and date medication taken. 9. Bring ID and current insurance cards. 10.Please wear clothes that are easy to get on and off and wear slip-on shoes.  Thank you for allowing [place] to care for you!   -- [facility]   Important Information About Sugar         Signed, Werner Lean, MD  04/20/2022 11:09 AM    [place]

## 2022-04-24 ENCOUNTER — Telehealth: Payer: Self-pay | Admitting: *Deleted

## 2022-04-24 ENCOUNTER — Ambulatory Visit: Payer: Medicare Other | Attending: Internal Medicine

## 2022-04-24 DIAGNOSIS — I25110 Atherosclerotic heart disease of native coronary artery with unstable angina pectoris: Secondary | ICD-10-CM

## 2022-04-24 DIAGNOSIS — Z01812 Encounter for preprocedural laboratory examination: Secondary | ICD-10-CM | POA: Diagnosis not present

## 2022-04-24 LAB — BASIC METABOLIC PANEL
BUN/Creatinine Ratio: 15 (ref 10–24)
BUN: 20 mg/dL (ref 8–27)
CO2: 27 mmol/L (ref 20–29)
Calcium: 8.9 mg/dL (ref 8.6–10.2)
Chloride: 105 mmol/L (ref 96–106)
Creatinine, Ser: 1.31 mg/dL — ABNORMAL HIGH (ref 0.76–1.27)
Glucose: 105 mg/dL — ABNORMAL HIGH (ref 70–99)
Potassium: 4.5 mmol/L (ref 3.5–5.2)
Sodium: 141 mmol/L (ref 134–144)
eGFR: 57 mL/min/{1.73_m2} — ABNORMAL LOW (ref >59)

## 2022-04-24 LAB — CBC WITH DIFFERENTIAL/PLATELET
Basophils Absolute: 0 10*3/uL (ref 0.0–0.2)
Basos: 0 %
EOS (ABSOLUTE): 0.1 10*3/uL (ref 0.0–0.4)
Eos: 2 %
Hematocrit: 44.9 % (ref 37.5–51.0)
Hemoglobin: 15.4 g/dL (ref 13.0–17.7)
Lymphocytes Absolute: 2.1 10*3/uL (ref 0.7–3.1)
Lymphs: 29 %
MCH: 30.5 pg (ref 26.6–33.0)
MCHC: 34.3 g/dL (ref 31.5–35.7)
MCV: 89 fL (ref 79–97)
Monocytes Absolute: 0.8 10*3/uL (ref 0.1–0.9)
Monocytes: 12 %
Neutrophils Absolute: 4.2 10*3/uL (ref 1.4–7.0)
Neutrophils: 57 %
Platelets: 189 10*3/uL (ref 150–450)
RBC: 5.05 x10E6/uL (ref 4.14–5.80)
RDW: 14.2 % (ref 11.6–15.4)
WBC: 7.3 10*3/uL (ref 3.4–10.8)

## 2022-04-24 NOTE — Telephone Encounter (Signed)
Patient plans to go to [facility] lab now for STAT BMP/CBC/d. Patient reports since increasing Ranexa to 1000 mg bid and office visit with Dr Gasper Sells 04/20/22,his chest pain symptoms are not as severe and have improved, although he does still have some chest pain with exertion. Patient would like to talk with Dr Gasper Sells today before cath tomorrow.   Patient advised I will his message to Dr Gasper Sells

## 2022-04-24 NOTE — Telephone Encounter (Signed)
Left message for patient and patient's spouse (DPR), Norvis, to call back to discuss lab results/procedure instructions.

## 2022-04-24 NOTE — Telephone Encounter (Signed)
Pt came into the office spoke directly with MD.  Pt will move forward with heart cath.  All questions answered.

## 2022-04-24 NOTE — Telephone Encounter (Signed)
Per Dr Orlin Hilding from 04/20/22 CBC/BMP results: "Elevated K with no medications causing hyper K Low platelets on ASA only - repeat labs prior to Cath" _________ Cardiac Catheterization scheduled at [facility] for: Tuesday April 25, 2022 10:30 AM Arrival time and place: [place] Entrance A at 8:30 AM  Nothing to eat after midnight prior to procedure, clear liquids until 5 AM day of procedure.  Medication instructions: -Usual morning medications can be taken with sips of water including aspirin 81 mg.  Confirmed patient has responsible adult to drive home post procedure and be with patient first 24 hours after arriving home.  Patient reports no new symptoms concerning for COVID-19 in the past 10 days.   Left message for patient to call back to discuss lab results/procedure instructions.

## 2022-04-25 ENCOUNTER — Encounter (HOSPITAL_COMMUNITY)
Admission: RE | Disposition: A | Discharge status: Home / Self Care | Payer: Self-pay | Source: Home / Self Care | Attending: Interventional Cardiology

## 2022-04-25 ENCOUNTER — Observation Stay (HOSPITAL_COMMUNITY)
Admission: RE | Admit: 2022-04-25 | Discharge: 2022-04-26 | Disposition: A | Discharge status: Home / Self Care | Payer: Medicare Other | Attending: Interventional Cardiology | Admitting: Interventional Cardiology

## 2022-04-25 ENCOUNTER — Other Ambulatory Visit: Payer: Self-pay

## 2022-04-25 ENCOUNTER — Other Ambulatory Visit (HOSPITAL_COMMUNITY): Payer: Self-pay

## 2022-04-25 ENCOUNTER — Encounter (HOSPITAL_COMMUNITY): Payer: Self-pay | Admitting: Interventional Cardiology

## 2022-04-25 DIAGNOSIS — R079 Chest pain, unspecified: Secondary | ICD-10-CM | POA: Diagnosis not present

## 2022-04-25 DIAGNOSIS — I2510 Atherosclerotic heart disease of native coronary artery without angina pectoris: Secondary | ICD-10-CM | POA: Diagnosis not present

## 2022-04-25 DIAGNOSIS — I10 Essential (primary) hypertension: Secondary | ICD-10-CM | POA: Diagnosis not present

## 2022-04-25 DIAGNOSIS — Z7982 Long term (current) use of aspirin: Secondary | ICD-10-CM | POA: Insufficient documentation

## 2022-04-25 DIAGNOSIS — I25110 Atherosclerotic heart disease of native coronary artery with unstable angina pectoris: Secondary | ICD-10-CM

## 2022-04-25 DIAGNOSIS — Z79899 Other long term (current) drug therapy: Secondary | ICD-10-CM | POA: Diagnosis not present

## 2022-04-25 DIAGNOSIS — Z955 Presence of coronary angioplasty implant and graft: Secondary | ICD-10-CM

## 2022-04-25 DIAGNOSIS — E785 Hyperlipidemia, unspecified: Secondary | ICD-10-CM | POA: Diagnosis present

## 2022-04-25 HISTORY — PX: CORONARY STENT INTERVENTION: CATH118234

## 2022-04-25 HISTORY — PX: INTRAVASCULAR PRESSURE WIRE/FFR STUDY: CATH118243

## 2022-04-25 HISTORY — PX: LEFT HEART CATH AND CORONARY ANGIOGRAPHY: CATH118249

## 2022-04-25 LAB — POCT ACTIVATED CLOTTING TIME
Activated Clotting Time: 269 seconds
Activated Clotting Time: 293 seconds
Activated Clotting Time: 287 seconds

## 2022-04-25 SURGERY — LEFT HEART CATH AND CORONARY ANGIOGRAPHY
Anesthesia: LOCAL

## 2022-04-25 MED ORDER — LIDOCAINE HCL (PF) 1 % IJ SOLN
INTRAMUSCULAR | Status: DC | PRN
  Administered 2022-04-25: 2 mL

## 2022-04-25 MED ORDER — HEPARIN SODIUM (PORCINE) 1000 UNIT/ML IJ SOLN
INTRAMUSCULAR | Status: AC
  Filled 2022-04-25: qty 10

## 2022-04-25 MED ORDER — POLYETHYL GLYCOL-PROPYL GLYCOL 0.4-0.3 % OP GEL: Freq: Every day | OPHTHALMIC | Status: DC | PRN

## 2022-04-25 MED ORDER — ASPIRIN 81 MG PO TBEC: 81.0000 mg | DELAYED_RELEASE_TABLET | Freq: Every day | ORAL | Status: DC

## 2022-04-25 MED ORDER — RANOLAZINE ER 500 MG PO TB12
1000.0000 mg | ORAL_TABLET | Freq: Two times a day (BID) | ORAL | Status: DC
  Administered 2022-04-25 – 2022-04-26 (×3): 1000 mg via ORAL
  Filled 2022-04-25 (×3): qty 2

## 2022-04-25 MED ORDER — SODIUM CHLORIDE 0.9 % IV SOLN: 250.0000 mL | INTRAVENOUS | Status: DC | PRN

## 2022-04-25 MED ORDER — VITAMIN B-12 100 MCG PO TABS: 1000.0000 ug | ORAL_TABLET | ORAL | Status: DC

## 2022-04-25 MED ORDER — HYDROMORPHONE HCL 1 MG/ML IJ SOLN
0.5000 mg | Freq: Once | INTRAMUSCULAR | Status: AC
  Administered 2022-04-25: 0.5 mg via INTRAVENOUS

## 2022-04-25 MED ORDER — SODIUM CHLORIDE 0.9% FLUSH: 3.0000 mL | INTRAVENOUS | Status: DC | PRN

## 2022-04-25 MED ORDER — ASPIRIN 81 MG PO CHEW: 81.0000 mg | CHEWABLE_TABLET | ORAL | Status: DC

## 2022-04-25 MED ORDER — VERAPAMIL HCL 2.5 MG/ML IV SOLN
INTRAVENOUS | Status: AC
  Filled 2022-04-25: qty 2

## 2022-04-25 MED ORDER — HYDROMORPHONE HCL 1 MG/ML IJ SOLN
0.5000 mg | INTRAMUSCULAR | Status: DC | PRN
  Administered 2022-04-25: 1 mg via INTRAVENOUS
  Filled 2022-04-25: qty 1

## 2022-04-25 MED ORDER — MIDAZOLAM HCL 2 MG/2ML IJ SOLN
INTRAMUSCULAR | Status: AC
  Filled 2022-04-25: qty 2

## 2022-04-25 MED ORDER — CLOPIDOGREL BISULFATE 300 MG PO TABS
ORAL_TABLET | ORAL | Status: DC | PRN
  Administered 2022-04-25: 600 mg via ORAL

## 2022-04-25 MED ORDER — CLOPIDOGREL BISULFATE 75 MG PO TABS
75.0000 mg | ORAL_TABLET | Freq: Every day | ORAL | Status: DC
  Administered 2022-04-26: 75 mg via ORAL
  Filled 2022-04-25: qty 1

## 2022-04-25 MED ORDER — HYDRALAZINE HCL 20 MG/ML IJ SOLN
10.0000 mg | INTRAMUSCULAR | Status: AC | PRN
  Administered 2022-04-25: 10 mg via INTRAVENOUS

## 2022-04-25 MED ORDER — MORPHINE SULFATE (PF) 2 MG/ML IV SOLN
INTRAVENOUS | Status: AC
  Filled 2022-04-25: qty 1

## 2022-04-25 MED ORDER — MORPHINE SULFATE (PF) 2 MG/ML IV SOLN
1.0000 mg | INTRAVENOUS | Status: DC | PRN
  Administered 2022-04-25 (×2): 2 mg via INTRAVENOUS

## 2022-04-25 MED ORDER — ROSUVASTATIN CALCIUM 20 MG PO TABS
40.0000 mg | ORAL_TABLET | Freq: Every day | ORAL | Status: DC
  Administered 2022-04-25 – 2022-04-26 (×2): 40 mg via ORAL
  Filled 2022-04-25 (×2): qty 2

## 2022-04-25 MED ORDER — HEPARIN (PORCINE) IN NACL 1000-0.9 UT/500ML-% IV SOLN
INTRAVENOUS | Status: DC | PRN
  Administered 2022-04-25 (×2): 500 mL

## 2022-04-25 MED ORDER — PANTOPRAZOLE SODIUM 40 MG PO TBEC
40.0000 mg | DELAYED_RELEASE_TABLET | Freq: Every day | ORAL | Status: DC
  Administered 2022-04-25 – 2022-04-26 (×2): 40 mg via ORAL
  Filled 2022-04-25 (×2): qty 1

## 2022-04-25 MED ORDER — ONDANSETRON HCL 4 MG/2ML IJ SOLN: 4.0000 mg | Freq: Four times a day (QID) | INTRAMUSCULAR | Status: DC | PRN

## 2022-04-25 MED ORDER — MIDAZOLAM HCL 2 MG/2ML IJ SOLN
INTRAMUSCULAR | Status: DC | PRN
  Administered 2022-04-25: 2 mg via INTRAVENOUS
  Administered 2022-04-25 (×2): 1 mg via INTRAVENOUS

## 2022-04-25 MED ORDER — NITROGLYCERIN 0.4 MG SL SUBL: 0.4000 mg | SUBLINGUAL_TABLET | SUBLINGUAL | Status: DC | PRN

## 2022-04-25 MED ORDER — FENTANYL CITRATE (PF) 100 MCG/2ML IJ SOLN
INTRAMUSCULAR | Status: DC | PRN
  Administered 2022-04-25 (×4): 25 ug via INTRAVENOUS

## 2022-04-25 MED ORDER — HYDROMORPHONE HCL 1 MG/ML IJ SOLN: 0.5000 mg | INTRAMUSCULAR | Status: DC | PRN

## 2022-04-25 MED ORDER — VERAPAMIL HCL 2.5 MG/ML IV SOLN
INTRAVENOUS | Status: DC | PRN
  Administered 2022-04-25: 10 mL via INTRA_ARTERIAL

## 2022-04-25 MED ORDER — LIDOCAINE HCL (PF) 1 % IJ SOLN
INTRAMUSCULAR | Status: AC
  Filled 2022-04-25: qty 30

## 2022-04-25 MED ORDER — FAMOTIDINE IN NACL 20-0.9 MG/50ML-% IV SOLN
INTRAVENOUS | Status: AC | PRN
  Administered 2022-04-25: 20 mg via INTRAVENOUS

## 2022-04-25 MED ORDER — SODIUM CHLORIDE 0.9 % WEIGHT BASED INFUSION: 1.0000 mL/kg/h | INTRAVENOUS | Status: DC

## 2022-04-25 MED ORDER — HYDRALAZINE HCL 20 MG/ML IJ SOLN
INTRAMUSCULAR | Status: AC
  Filled 2022-04-25: qty 1

## 2022-04-25 MED ORDER — SODIUM CHLORIDE 0.9% FLUSH
3.0000 mL | Freq: Two times a day (BID) | INTRAVENOUS | Status: DC
  Administered 2022-04-26: 3 mL via INTRAVENOUS

## 2022-04-25 MED ORDER — LABETALOL HCL 5 MG/ML IV SOLN
INTRAVENOUS | Status: AC
  Filled 2022-04-25: qty 4

## 2022-04-25 MED ORDER — LABETALOL HCL 5 MG/ML IV SOLN
10.0000 mg | INTRAVENOUS | Status: AC | PRN
  Administered 2022-04-25 (×2): 10 mg via INTRAVENOUS

## 2022-04-25 MED ORDER — SODIUM CHLORIDE 0.9% FLUSH
3.0000 mL | Freq: Two times a day (BID) | INTRAVENOUS | Status: DC
  Administered 2022-04-25 – 2022-04-26 (×2): 3 mL via INTRAVENOUS

## 2022-04-25 MED ORDER — ALUM HYDROXIDE-MAG TRISILICATE 80-20 MG PO CHEW: 2.0000 | CHEWABLE_TABLET | Freq: Every day | ORAL | Status: DC | PRN

## 2022-04-25 MED ORDER — NAPROXEN 250 MG PO TABS: 500.0000 mg | ORAL_TABLET | Freq: Two times a day (BID) | ORAL | Status: DC | PRN

## 2022-04-25 MED ORDER — METOPROLOL TARTRATE 12.5 MG HALF TABLET
12.5000 mg | ORAL_TABLET | Freq: Two times a day (BID) | ORAL | Status: DC
  Administered 2022-04-25 – 2022-04-26 (×3): 12.5 mg via ORAL
  Filled 2022-04-25 (×3): qty 1

## 2022-04-25 MED ORDER — FAMOTIDINE IN NACL 20-0.9 MG/50ML-% IV SOLN
INTRAVENOUS | Status: AC
  Filled 2022-04-25: qty 50

## 2022-04-25 MED ORDER — FENTANYL CITRATE (PF) 100 MCG/2ML IJ SOLN
INTRAMUSCULAR | Status: AC
  Filled 2022-04-25: qty 2

## 2022-04-25 MED ORDER — SODIUM CHLORIDE 0.9 % WEIGHT BASED INFUSION
3.0000 mL/kg/h | INTRAVENOUS | Status: DC
  Administered 2022-04-25: 3 mL/kg/h via INTRAVENOUS

## 2022-04-25 MED ORDER — IOHEXOL 350 MG/ML SOLN
INTRAVENOUS | Status: DC | PRN
  Administered 2022-04-25: 130 mL

## 2022-04-25 MED ORDER — ACETAMINOPHEN 325 MG PO TABS: 650.0000 mg | ORAL_TABLET | ORAL | Status: DC | PRN

## 2022-04-25 MED ORDER — HYDROMORPHONE HCL 1 MG/ML IJ SOLN
INTRAMUSCULAR | Status: AC
  Filled 2022-04-25: qty 0.5

## 2022-04-25 MED ORDER — SODIUM CHLORIDE 0.9 % IV SOLN: INTRAVENOUS | Status: AC

## 2022-04-25 MED ORDER — HEPARIN SODIUM (PORCINE) 1000 UNIT/ML IJ SOLN
INTRAMUSCULAR | Status: DC | PRN
  Administered 2022-04-25: 3000 [IU] via INTRAVENOUS
  Administered 2022-04-25 (×2): 2000 [IU] via INTRAVENOUS
  Administered 2022-04-25 (×2): 4500 [IU] via INTRAVENOUS

## 2022-04-25 MED ORDER — ASPIRIN 81 MG PO CHEW
81.0000 mg | CHEWABLE_TABLET | Freq: Every day | ORAL | Status: DC
  Administered 2022-04-26: 81 mg via ORAL
  Filled 2022-04-25: qty 1

## 2022-04-25 MED ORDER — CLOPIDOGREL BISULFATE 75 MG PO TABS
75.0000 mg | ORAL_TABLET | Freq: Every day | ORAL | 1 refills | Status: DC
  Filled 2022-04-25: qty 30, 30d supply, fill #0

## 2022-04-25 SURGICAL SUPPLY — 23 items
BALL SAPPHIRE NC24 3.25X12 (BALLOONS) ×1
BALLN SAPPHIRE 2.5X15 (BALLOONS) ×1
BALLN WOLVERINE 2.75X10 (BALLOONS) ×1
BALLN WOLVERINE 3.00X15 (BALLOONS) ×1
BALLOON SAPPHIRE 2.5X15 (BALLOONS) IMPLANT
BALLOON SAPPHIRE NC24 3.25X12 (BALLOONS) IMPLANT
BALLOON WOLVERINE 2.75X10 (BALLOONS) IMPLANT
BALLOON WOLVERINE 3.00X15 (BALLOONS) IMPLANT
CATH 5FR JL3.5 JR4 ANG PIG MP (CATHETERS) IMPLANT
CATH LAUNCHER 6FR EBU3.5 (CATHETERS) IMPLANT
DEVICE RAD COMP TR BAND LRG (VASCULAR PRODUCTS) IMPLANT
GLIDESHEATH SLEND SS 6F .021 (SHEATH) IMPLANT
GUIDEWIRE INQWIRE 1.5J.035X260 (WIRE) IMPLANT
GUIDEWIRE PRESSURE X 175 (WIRE) IMPLANT
INQWIRE 1.5J .035X260CM (WIRE) ×1
KIT ENCORE 26 ADVANTAGE (KITS) IMPLANT
KIT HEART LEFT (KITS) ×1 IMPLANT
PACK CARDIAC CATHETERIZATION (CUSTOM PROCEDURE TRAY) ×1 IMPLANT
STENT SYNERGY XD 2.75X24 (Permanent Stent) IMPLANT
SYNERGY XD 2.75X24 (Permanent Stent) ×1 IMPLANT
TRANSDUCER W/STOPCOCK (MISCELLANEOUS) ×1 IMPLANT
TUBING CIL FLEX 10 FLL-RA (TUBING) ×1 IMPLANT
WIRE ASAHI PROWATER 180CM (WIRE) IMPLANT

## 2022-04-25 NOTE — TOC Transition Note (Signed)
Discharge medication (1) is being stored in the Transitions of Care [facility]) Pharmacy on the second floor until patient is ready for discharge.

## 2022-04-25 NOTE — Progress Notes (Signed)
Pain medicine offered to patient; he does not want it right now. Patient spoke to wife on phone.

## 2022-04-25 NOTE — Progress Notes (Addendum)
Forearm hematoma forming after guide removal and TR band placement. Level 2.  Hematoma 7 inches by 3inches proximal to tr band on right forearm.   Dr. Irish Lack present. Blood pressure cuff applied per protocol. Approximately 42mhg. Spo2 95% as measured in right thumb.   Addendum : BP cuff reinflated @ 12:30:00 664mHG.   2cc air added to TR band per Dr. VaIrish Lack

## 2022-04-25 NOTE — Progress Notes (Signed)
Rt arm elevated on pillows; ice pack on rt forearm

## 2022-04-25 NOTE — Progress Notes (Signed)
Dr. Irish Lack to see patient; reverse barbeau test performed. Began deflating on TR band per Modesta Messing, RCIS. Will removed TR band per protocol. Will reassess prior to return to short stay.Cindee Salt

## 2022-04-25 NOTE — Progress Notes (Signed)
Dr. Trula Slade to see patient. TR band removed and 2x2 gauze with tegaderm applied. Patient expressed some relief however forearm remains tender. O2 sat 99 -100% after removal of TR band. Large BP cuff re-applied; protocol followed x 15 min. Will admit patient for further evaluation.Cindee Salt

## 2022-04-25 NOTE — Progress Notes (Addendum)
Dr. Irish Lack in. Reapplied BP cuff to rt forearm at 1555 and inflated. Patient c/o excruciating pain in rt arm and patient removed cuff himself. Dr. Irish Lack at bedside talking w/patient

## 2022-04-25 NOTE — Progress Notes (Signed)
BP cuff slowly deflated and now removed. Palpable rt radial pulse. Good pleth waveform.

## 2022-04-25 NOTE — Consult Note (Signed)
Vascular and Vein Specialist of [facility]  Patient name: Jeffery Kirby MRN: [id number] DOB: [birth year] Sex: male   REQUESTING PROVIDER:    Dr. Irish Lack   REASON FOR CONSULT:    Right forearm hematoma  HISTORY OF PRESENT ILLNESS:   Jeffery Kirby is a 76 y.o. male, who underwent PCI earlier today via a right ulnar artery access earlier today.  He developed a hematoma and had the TR band replaced.  I was asked to reevaluate him for a forearm hematoma.  Patient suffers from hypertension.  He takes a statin for hypercholesterolemia.  He is now on dual antiplatelet therapy.  He is a non-smoker.  PAST MEDICAL HISTORY    Past Medical History:  Diagnosis Date   Angina pectoris ([place])    Cataract    Exertional chest pain    Hyperlipidemia    Hypertension    Shingles 2012   Tinnitus      FAMILY HISTORY   Family History  Problem Relation Age of Onset   Diabetes Mother    Kidney disease Mother    COPD Mother    Heart attack Father    Heart attack Sister    Heart attack Sister    Colon cancer Neg Hx    Colon polyps Neg Hx    Esophageal cancer Neg Hx    Rectal cancer Neg Hx    Stomach cancer Neg Hx     SOCIAL HISTORY:   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  Tobacco Use   Smoking status: Never   Smokeless tobacco: Never  Vaping Use   Vaping Use: Never used  Substance and Sexual Activity   Alcohol use: Yes    Comment: occasional beer   Drug use: Not Currently   Sexual activity: Not on file  Other Topics Concern   Not on file  Social History Narrative   Right handed   Lives with wife    Retired   Investment banker, operational of Radio broadcast assistant Strain: Not  on file  Food Insecurity: Not on file  Transportation Needs: Not on file  Physical Activity: Not on file  Stress: Not on file  Social Connections: Not on file  Intimate Partner Violence: Not on file    ALLERGIES:    No Known Allergies  CURRENT MEDICATIONS:    Current Facility-Administered Medications  Medication Dose Route Frequency Provider Last Rate Last Admin   0.9 %  sodium chloride infusion  250 mL Intravenous PRN Chandrasekhar, Mahesh A, MD       0.9% sodium chloride infusion  1 mL/kg/hr Intravenous Continuous Chandrasekhar, Mahesh A, MD 86.2 mL/hr at 04/25/22 1014 1 mL/kg/hr at 04/25/22 1014   aspirin chewable tablet 81 mg  81 mg Oral Pre-Cath Chandrasekhar, Mahesh A, MD       [START ON 04/26/2022] aspirin chewable tablet 81 mg  81 mg Oral Daily Jettie Booze, MD       [START ON 04/26/2022] clopidogrel (PLAVIX) tablet 75 mg  75 mg Oral Daily Jettie Booze, MD       clopidogrel (PLAVIX) tablet    PRN Jettie Booze, MD   600 mg at 04/25/22 1111   famotidine (  PEPCID) IVPB 20 mg premix    Continuous PRN Jettie Booze, MD   20 mg at 04/25/22 1117   fentaNYL (SUBLIMAZE) injection    PRN Jettie Booze, MD   25 mcg at 04/25/22 1149   Heparin (Porcine) in NaCl 1000-0.9 UT/500ML-% SOLN    PRN Jettie Booze, MD   500 mL at 04/25/22 1032   heparin sodium (porcine) injection    PRN Jettie Booze, MD   2,000 Units at 04/25/22 1153   iohexol (OMNIPAQUE) 350 MG/ML injection    PRN Jettie Booze, MD   130 mL at 04/25/22 1149   lidocaine (PF) (XYLOCAINE) 1 % injection    PRN Jettie Booze, MD   2 mL at 04/25/22 1045   midazolam (VERSED) injection    PRN Jettie Booze, MD   1 mg at 04/25/22 1133   morphine (PF) 2 MG/ML injection 1-4 mg  1-4 mg Intravenous Q2H PRN Jettie Booze, MD   2 mg at 04/25/22 1424   Radial Cocktail/Verapamil only    PRN Jettie Booze, MD   10 mL at 04/25/22 1048   Radial  Cocktail/Verapamil only    PRN Jettie Booze, MD   10 mL at 04/25/22 1146   sodium chloride flush (NS) 0.9 % injection 3 mL  3 mL Intravenous PRN Chandrasekhar, Mahesh A, MD        REVIEW OF SYSTEMS:   '[X]'$  denotes positive finding, '[ ]'$  denotes negative finding Cardiac  Comments:  Chest pain or chest pressure: x   Shortness of breath upon exertion:    Short of breath when lying flat:    Irregular heart rhythm:        Vascular    Pain in calf, thigh, or hip brought on by ambulation:    Pain in feet at night that wakes you up from your sleep:     Blood clot in your veins:    Leg swelling:         Pulmonary    Oxygen at home:    Productive cough:     Wheezing:         Neurologic    Sudden weakness in arms or legs:     Sudden numbness in arms or legs:     Sudden onset of difficulty speaking or slurred speech:    Temporary loss of vision in one eye:     Problems with dizziness:         Gastrointestinal    Blood in stool:      Vomited blood:         Genitourinary    Burning when urinating:     Blood in urine:        Psychiatric    Major depression:         Hematologic    Bleeding problems:    Problems with blood clotting too easily:        Skin    Rashes or ulcers:        Constitutional    Fever or chills:     PHYSICAL EXAM:   Vitals:   04/25/22 1320 04/25/22 1325 04/25/22 1330 04/25/22 1333  BP: (!) 158/73 (!) 152/71 (!) 166/126 (!) 166/126  Pulse: 62 62 60 60  Resp: (!) '21 14 16 20  '$ Temp:      TempSrc:      SpO2: 92% 92% 94% 95%  Weight:      Height:  GENERAL: The patient is a well-nourished male, in no acute distress. The vital signs are documented above. CARDIAC: There is a regular rate and rhythm.  VASCULAR: Bluish discoloration of the right hand upon my arrival with TR band inflated.  The right forearm is very hard and tense.  The patient reports numbness and tingling in his fingers PULMONARY: Nonlabored respirations MUSCULOSKELETAL:  There are no major deformities or cyanosis. SKIN: There are no ulcers or rashes noted. PSYCHIATRIC: The patient has a normal affect.  STUDIES:   none  ASSESSMENT and PLAN   Right forearm hematoma following cardiac catheterization: The patient's TR band was removed.  Immediately, the ischemic appearance of his right hand began to improve however he was still having issues with tingling.  He does have tense compartments in his forearm, however after removing the TR band these were not as tender to the touch and did not feel as tense.  I have recommended that we use the current protocol for blood pressure cuff inflation on the forearm, keeping his arm elevated, and applying ice to the forearm.  Hopefully this will get his clinical picture to improve.  If not, fasciotomy of the forearm would need to be considered.  He will be admitted for further observation.   Leia Alf, MD, FACS Vascular and Vein Specialists of [facility] [phone] Pager [phone]

## 2022-04-25 NOTE — Progress Notes (Signed)
   Patient with ultrasound guided right ulnar access for cath PCI.  Heparin and Plavix used for the procedure.    Patient developed forearm hematoma post procedure.  Blood pressure cuff inflated on the forearm multiple times over the course of the afternoon.  Waveform to hand maintained via plethysmography throughout.  The patient has complained of increasing pain, particularly in the back of his hand.  His right forearm is still tight.  TR band has been deflated somewhat, down to 10 cc but not off as of yet.  He has received a dose of morphine without pain relief.  Given the persistent pain in his hand, will consult vascular.  Jettie Booze, MD

## 2022-04-25 NOTE — Progress Notes (Signed)
VASCULAR SURGERY:  Patient's right hand is warm.  There is no change in the forearm.  He has good motor and sensory function in the right hand.  Continue the current protocol with intermittent blood pressure cuff inflation, elevation, and ice to the forearm.  Gae Gallop, MD 6:15 PM

## 2022-04-25 NOTE — TOC Transition Note (Signed)
One (1) discharge med in the [facility] on second floor until discharge.

## 2022-04-25 NOTE — Discharge Instructions (Signed)

## 2022-04-25 NOTE — Interval H&P Note (Signed)
Cath Lab Visit (complete for each Cath Lab visit)  Clinical Evaluation Leading to the Procedure:   ACS: No.  Non-ACS:    Anginal Classification: CCS III  Anti-ischemic medical therapy: Minimal Therapy (1 class of medications)  Non-Invasive Test Results: No non-invasive testing performed  Prior CABG: No previous CABG      History and Physical Interval Note:  04/25/2022 10:22 AM  Jeffery Kirby  has presented today for surgery, with the diagnosis of cad.  The various methods of treatment have been discussed with the patient and family. After consideration of risks, benefits and other options for treatment, the patient has consented to  Procedure(s): LEFT HEART CATH AND CORONARY ANGIOGRAPHY (N/A) as a surgical intervention.  The patient's history has been reviewed, patient examined, no change in status, stable for surgery.  I have reviewed the patient's chart and labs.  Questions were answered to the patient's satisfaction.     Larae Grooms

## 2022-04-25 NOTE — Progress Notes (Signed)
Patient returned from short stay to cath lab holding area bay 3. Short stay RN Adonis Huguenin expressed concern regarding care of forearm hematoma. Patient assessed. Forearm hematoma unchanged from previous assessment. No growth from marked site. Site remains firm but no increase in size. TR band remains in place. Hand is discolored. Capillary refill is <3 sec with O2 sat 92-93%. Patient states his arm feels stretched. Dr. Irish Lack notified; orders given to resume BP protocol x 74mn. Dr. VIrish Lackwill assess patient after 15 min application.BCindee Salt

## 2022-04-26 ENCOUNTER — Encounter (HOSPITAL_COMMUNITY): Payer: Self-pay | Admitting: Interventional Cardiology

## 2022-04-26 DIAGNOSIS — I25118 Atherosclerotic heart disease of native coronary artery with other forms of angina pectoris: Secondary | ICD-10-CM | POA: Diagnosis not present

## 2022-04-26 DIAGNOSIS — E782 Mixed hyperlipidemia: Secondary | ICD-10-CM

## 2022-04-26 DIAGNOSIS — I10 Essential (primary) hypertension: Secondary | ICD-10-CM

## 2022-04-26 DIAGNOSIS — Z7982 Long term (current) use of aspirin: Secondary | ICD-10-CM | POA: Diagnosis not present

## 2022-04-26 DIAGNOSIS — R079 Chest pain, unspecified: Secondary | ICD-10-CM | POA: Diagnosis not present

## 2022-04-26 DIAGNOSIS — Z79899 Other long term (current) drug therapy: Secondary | ICD-10-CM | POA: Diagnosis not present

## 2022-04-26 DIAGNOSIS — I2510 Atherosclerotic heart disease of native coronary artery without angina pectoris: Secondary | ICD-10-CM | POA: Diagnosis not present

## 2022-04-26 LAB — CBC
HCT: 37.2 % — ABNORMAL LOW (ref 39.0–52.0)
Hemoglobin: 12.7 g/dL — ABNORMAL LOW (ref 13.0–17.0)
MCH: 30.6 pg (ref 26.0–34.0)
MCHC: 34.1 g/dL (ref 30.0–36.0)
MCV: 89.6 fL (ref 80.0–100.0)
Platelets: 175 10*3/uL (ref 150–400)
RBC: 4.15 MIL/uL — ABNORMAL LOW (ref 4.22–5.81)
RDW: 13.2 % (ref 11.5–15.5)
WBC: 10.1 10*3/uL (ref 4.0–10.5)
nRBC: 0 % (ref 0.0–0.2)

## 2022-04-26 LAB — BASIC METABOLIC PANEL
Anion gap: 9 (ref 5–15)
BUN: 15 mg/dL (ref 8–23)
CO2: 19 mmol/L — ABNORMAL LOW (ref 22–32)
Calcium: 8.8 mg/dL — ABNORMAL LOW (ref 8.9–10.3)
Chloride: 110 mmol/L (ref 98–111)
Creatinine, Ser: 1.15 mg/dL (ref 0.61–1.24)
GFR, Estimated: >60 mL/min (ref >60)
Glucose, Bld: 106 mg/dL — ABNORMAL HIGH (ref 70–99)
Potassium: 3.7 mmol/L (ref 3.5–5.1)
Sodium: 138 mmol/L (ref 135–145)

## 2022-04-26 NOTE — Discharge Summary (Addendum)
Discharge Summary    Patient ID: Jeffery Kirby MRN: [id number]; DOB: 02/11/1946  Admit date: 04/25/2022 Discharge date: 04/26/2022  PCP:  Ginger Organ., MD   [place] Providers Cardiologist:  Werner Lean, MD     Discharge Diagnoses    Principal Problem:   Chest pain Active Problems:   Hyperlipidemia   Essential hypertension   CAD (coronary artery disease)  Diagnostic Studies/Procedures    Cath: 04/25/22    Dist LAD lesion is 95% stenosed.  Unchanged from prior and too distal for intervention.   Ost Cx to Prox Cx lesion is 20% stenosed.   1st Diag lesion is 50% stenosed.   1st LPL lesion is 40% stenosed.   Prox LAD lesion is 50% stenosed.  RFR abnormal, 0.83 indicating significant flow reduction in this territory.   A drug-eluting stent was successfully placed using a SYNERGY XD 2.75X24, postdilated to 3.25 mm.   Post intervention, there is a 0% residual stenosis.   The left ventricular systolic function is normal.   LV end diastolic pressure is normal.   The left ventricular ejection fraction is 55-65% by visual estimate.   There is no aortic valve stenosis.   Continue dual antiplatelet therapy along with aggressive secondary prevention.  He had typical anginal symptoms with balloon inflations.  Should be able to gradually decrease antianginal therapy starting with ranolazine.  He does still have distal LAD disease which is not amenable to PCI.  Diagnostic Dominance: Co-dominant  Intervention   _____________   History of Present Illness     Jeffery Kirby is a 76 y.o. male with PMH of HTN and HLD who had a cardiac CT back in 2022 showing multivessel with nonobstructive other than apical disease and treated medically. Presented back to the office on 10/19 with ongoing anginal symptoms and set up for outpatient cardiac cath.   Hospital Course     CAD -- underwent cardiac cath noted above with distal LAD disease not amenable to  PCI but 50% pLAD stenosis with positive RFR of 0.83 treated with PCI/DES x1. Plan for DAPT with ASA/plavix for at least 6 months. Seen by cardiac rehab  HLD -- continue crestor 40mg  daily  HTN -- controlled -- continue metoprolol 12.5mg  BID  Forearm hematoma -- developed swelling into the right forearm post cath. Given concern VVS was consulted. He was monitored overnight and site remained stable. Cleared for discharge. Follow up with [facility]VS as needed  General: Well developed, well nourished, male appearing in no acute distress. Head: Normocephalic, atraumatic.  Neck: Supple without bruits, JVD. Lungs:  Resp regular and unlabored, CTA. Heart: RRR, S1, S2, no S3, S4, or murmur; no rub. Abdomen: Soft, non-tender, non-distended with normoactive bowel sounds. No hepatomegaly. No rebound/guarding. No obvious abdominal masses. Extremities: No clubbing, cyanosis, edema. Distal pedal pulses are 2+ bilaterally. Right ulnar cath site stable with mild bruising, swelling to forearm (stable) Neuro: Alert and oriented X 3. Moves all extremities spontaneously. Psych: Normal affect.    Did the patient have an acute coronary syndrome (MI, NSTEMI, STEMI, etc) this admission?:  No                               Did the patient have a percutaneous coronary intervention (stent / angioplasty)?:  Yes.     Cath/PCI Registry Performance & Quality Measures: Aspirin prescribed? - Yes ADP Receptor Inhibitor (Plavix/Clopidogrel, Brilinta/Ticagrelor or Effient/Prasugrel) prescribed (includes medically  managed patients)? - Yes High Intensity Statin (Lipitor 40-80mg  or Crestor 20-40mg ) prescribed? - Yes For EF <40%, was ACEI/ARB prescribed? - Not Applicable (EF >/= 42%) For EF <40%, Aldosterone Antagonist (Spironolactone or Eplerenone) prescribed? - Not Applicable (EF >/= 70%) Cardiac Rehab Phase II ordered? - Yes     The patient will be scheduled for a TOC follow up appointment in 10-14 days.  A message has been  sent to the [facility] and Scheduling Pool at the office where the patient should be seen for follow up.  _____________  Discharge Vitals Blood pressure 122/64, pulse 77, temperature 98.6 F (37 C), temperature source Oral, resp. rate 19, height 6\' 2"  (1.88 m), weight 86.2 kg, SpO2 96 %.  Filed Weights   04/25/22 0851  Weight: 86.2 kg    Labs & Radiologic Studies    CBC Recent Labs    04/24/22 1033 04/26/22 0503  WBC 7.3 10.1  NEUTROABS 4.2  --   HGB 15.4 12.7*  HCT 44.9 37.2*  MCV 89 89.6  PLT 189 623   Basic Metabolic Panel Recent Labs    04/24/22 1033 04/26/22 0503  NA 141 138  K 4.5 3.7  CL 105 110  CO2 27 19*  GLUCOSE 105* 106*  BUN 20 15  CREATININE 1.31* 1.15  CALCIUM 8.9 8.8*   Liver Function Tests No results for input(s): "AST", "ALT", "ALKPHOS", "BILITOT", "PROT", "ALBUMIN" in the last 72 hours. No results for input(s): "LIPASE", "AMYLASE" in the last 72 hours. High Sensitivity Troponin:   No results for input(s): "TROPONINIHS" in the last 720 hours.  BNP Invalid input(s): "POCBNP" D-Dimer No results for input(s): "DDIMER" in the last 72 hours. Hemoglobin A1C No results for input(s): "HGBA1C" in the last 72 hours. Fasting Lipid Panel No results for input(s): "CHOL", "HDL", "LDLCALC", "TRIG", "CHOLHDL", "LDLDIRECT" in the last 72 hours. Thyroid Function Tests No results for input(s): "TSH", "T4TOTAL", "T3FREE", "THYROIDAB" in the last 72 hours.  Invalid input(s): "FREET3" _____________  CARDIAC CATHETERIZATION  Result Date: 04/25/2022   Dist LAD lesion is 95% stenosed.  Unchanged from prior and too distal for intervention.   Ost Cx to Prox Cx lesion is 20% stenosed.   1st Diag lesion is 50% stenosed.   1st LPL lesion is 40% stenosed.   Prox LAD lesion is 50% stenosed.  RFR abnormal, 0.83 indicating significant flow reduction in this territory.   A drug-eluting stent was successfully placed using a SYNERGY XD 2.75X24, postdilated to 3.25 mm.   Post  intervention, there is a 0% residual stenosis.   The left ventricular systolic function is normal.   LV end diastolic pressure is normal.   The left ventricular ejection fraction is 55-65% by visual estimate.   There is no aortic valve stenosis. Continue dual antiplatelet therapy along with aggressive secondary prevention.  He had typical anginal symptoms with balloon inflations.  Should be able to gradually decrease antianginal therapy starting with ranolazine.  He does still have distal LAD disease which is not amenable to PCI.   Disposition   Pt is being discharged home today in good condition.  Follow-up Plans & Appointments     Follow-up Information     Emmaline Life, NP Follow up on 05/03/2022.   Specialty: Nurse Practitioner Why: at 10:55am for your follow up appt Contact information: [place] [address] [phone]                Discharge Instructions  Amb Referral to Cardiac Rehabilitation   Complete by: As directed    Diagnosis: Coronary Stents   After initial evaluation and assessments completed: Virtual Based Care may be provided alone or in conjunction with Phase 2 Cardiac Rehab based on patient barriers.: Yes   Intensive Cardiac Rehabilitation (ICR) [place] location only OR Traditional Cardiac Rehabilitation (TCR) *If criteria for ICR are not met will enroll in TCR [facility] only): Yes   Call MD for:  difficulty breathing, headache or visual disturbances   Complete by: As directed    Call MD for:  persistant dizziness or light-headedness   Complete by: As directed    Call MD for:  redness, tenderness, or signs of infection (pain, swelling, redness, odor or green/yellow discharge around incision site)   Complete by: As directed    Diet - low sodium heart healthy   Complete by: As directed    Discharge instructions   Complete by: As directed    Radial Site Care Refer to this sheet in the next few weeks. These instructions provide you  with information on caring for yourself after your procedure. Your caregiver may also give you more specific instructions. Your treatment has been planned according to current medical practices, but problems sometimes occur. Call your caregiver if you have any problems or questions after your procedure. HOME CARE INSTRUCTIONS You may shower the day after the procedure. Remove the bandage (dressing) and gently wash the site with plain soap and water. Gently pat the site dry.  Do not apply powder or lotion to the site.  Do not submerge the affected site in water for 3 to 5 days.  Inspect the site at least twice daily.  Do not flex or bend the affected arm for 24 hours.  No lifting over 5 pounds (2.3 kg) for 5 days after your procedure.  Do not drive home if you are discharged the same day of the procedure. Have someone else drive you.  You may drive 24 hours after the procedure unless otherwise instructed by your caregiver.  What to expect: Any bruising will usually fade within 1 to 2 weeks.  Blood that collects in the tissue (hematoma) may be painful to the touch. It should usually decrease in size and tenderness within 1 to 2 weeks.  SEEK IMMEDIATE MEDICAL CARE IF: You have unusual pain at the radial site.  You have redness, warmth, swelling, or pain at the radial site.  You have drainage (other than a small amount of blood on the dressing).  You have chills.  You have a fever or persistent symptoms for more than 72 hours.  You have a fever and your symptoms suddenly get worse.  Your arm becomes pale, cool, tingly, or numb.  You have heavy bleeding from the site. Hold pressure on the site.   PLEASE DO NOT MISS ANY DOSES OF YOUR PLAVIX!!!!! Also keep a log of you blood pressures and bring back to your follow up appt. Please call the office with any questions.   Patients taking blood thinners should generally stay away from medicines like ibuprofen, Advil, Motrin, naproxen, and Aleve due to  risk of stomach bleeding. You may take Tylenol as directed or talk to your primary doctor about alternatives.   PLEASE ENSURE THAT YOU DO NOT RUN OUT OF YOUR PLAVIX. This medication is very important to remain on for at least 48months. IF you have issues obtaining this medication due to cost please CALL the office 3-5 business days prior to  running out in order to prevent missing doses of this medication.   Increase activity slowly   Complete by: As directed         Discharge Medications   Allergies as of 04/26/2022   No Known Allergies      Medication List     TAKE these medications    alum hydroxide-mag trisilicate 03-75 MG Chew chewable tablet Commonly known as: GAVISCON Chew 2 tablets by mouth daily as needed for indigestion or heartburn.   aspirin EC 81 MG tablet Take 1 tablet (81 mg total) by mouth daily. Swallow whole.   B-12 1000 MCG Subl Place 1,000 mcg under the tongue 2 (two) times a week.   clopidogrel 75 MG tablet Commonly known as: Plavix Take 1 tablet (75 mg total) by mouth daily.   metoprolol tartrate 25 MG tablet Commonly known as: LOPRESSOR Take 0.5 tablets (12.5 mg total) by mouth 2 (two) times daily.   naproxen 500 MG tablet Commonly known as: NAPROSYN Take 500 mg by mouth as needed for mild pain or moderate pain.   nitroGLYCERIN 0.4 MG SL tablet Commonly known as: NITROSTAT Place 1 tablet (0.4 mg total) under the tongue every 5 (five) minutes as needed for chest pain.   pantoprazole 40 MG tablet Commonly known as: PROTONIX Take 1 tablet (40 mg total) by mouth daily.   ranolazine 500 MG 12 hr tablet Commonly known as: Ranexa Take 1 tablet (500 mg total) by mouth 2 (two) times daily. What changed: how much to take   rosuvastatin 40 MG tablet Commonly known as: CRESTOR Take 1 tablet (40 mg total) by mouth daily.   SYSTANE OP Place 1 drop into both eyes daily as needed (dry eyes).         Outstanding Labs/Studies   N/a    Duration of Discharge Encounter   Greater than 30 minutes including physician time.  Signed, Reino Bellis, NP 04/26/2022, 10:30 AM   Patient seen and examined. Agree with assessment and plan.  Patient feels well following his PCI to proximal LAD yesterday which was RFR positive at 0.83.  He has walked the halls today and has not noticed any of his prior chest tightness.  Procedure was done through the right ulnar vessel due to a small radial vessel.  There is moderate ecchymosis from the thenar prominence of the hand up to below the elbow.  According to the patient it is less distended and more soft today compared to yesterday.  As above, plan for discharge today.   Troy Sine, MD, [facility] 04/26/2022 10:45 AM

## 2022-04-26 NOTE — Progress Notes (Signed)
CARDIAC REHAB PHASE I     Pt is dressed and hopeful for discharge home. Pt reports walking in  hall taking three laps so far this morning. Reports tolerating well with no cp and slight sob that resolved quickly with rest. Post sent education including site care, restrictions, risk factors, heart healthy diet, antiplatelet therapy importance, exercise guidelines and CRP2 reviewed. All questions and concerns addressed. Will refer to [facility] for CRP2. Will continue to follow.   0900-0930  Vanessa Barbara, RN BSN 04/26/2022 9:25 AM

## 2022-04-26 NOTE — Progress Notes (Signed)
    Subjective  - POD #1  No major issues overnight   Physical Exam:  Significant improvement in firmness of right forearm, although it is stil tight Good doppler flow to the right hand.  Jeffery Kirby       Assessment/Plan:  POD #1  Right forearm hematoma after radial cath:  Jeffery is much improved today.  At this time I do not think Jeffery will require fasciotomy.  I have encouraged him to continue to ice his forearm and keep it elevated.  Jeffery should contact me if Jeffery has any significant changes  Jeffery Kirby 04/26/2022 8:01 AM --  Vitals:   04/25/22 2359 04/26/22 0452  BP: (!) 120/92 122/64  Pulse: 79 77  Resp: 20 19  Temp: 98.3 F (36.8 C) 98.6 F (37 C)  SpO2: 95% 96%    Intake/Output Summary (Last 24 hours) at 04/26/2022 0801 Last data filed at 04/25/2022 1900 Gross per 24 hour  Intake 1351.74 ml  Output --  Net 1351.74 ml     Laboratory CBC    Component Value Date/Time   WBC 10.1 04/26/2022 0503   HGB 12.7 (L) 04/26/2022 0503   HGB 15.4 04/24/2022 1033   HCT 37.2 (L) 04/26/2022 0503   HCT 44.9 04/24/2022 1033   PLT 175 04/26/2022 0503   PLT 189 04/24/2022 1033    BMET    Component Value Date/Time   NA 138 04/26/2022 0503   NA 141 04/24/2022 1033   K 3.7 04/26/2022 0503   CL 110 04/26/2022 0503   CO2 19 (L) 04/26/2022 0503   GLUCOSE 106 (H) 04/26/2022 0503   BUN 15 04/26/2022 0503   BUN 20 04/24/2022 1033   CREATININE 1.15 04/26/2022 0503   CALCIUM 8.8 (L) 04/26/2022 0503   GFRNONAA >60 04/26/2022 0503   GFRAA 83 08/05/2020 1121    COAG No results found for: "INR", "PROTIME" No results found for: "PTT"  Antibiotics Anti-infectives (From admission, onward)    None        V. Leia Alf, M.D., [facility] [place] Office: [phone] Pager:  [phone]

## 2022-04-26 NOTE — Progress Notes (Signed)
  Progress Note    04/26/2022 7:48 AM 1 Day Post-Op  Subjective:  R forearm still tight however pain is much improved compared to yesterday   Vitals:   04/25/22 2359 04/26/22 0452  BP: (!) 120/92 122/64  Pulse: 79 77  Resp: 20 19  Temp: 98.3 F (36.8 C) 98.6 F (37 C)  SpO2: 95% 96%   Physical Exam Lungs:  non labored Extremities:  multiphasic R radial, ulnar, and palmar arch; forearm tight Neurologic: sensation intact R hand; 4/5 grip strength  CBC    Component Value Date/Time   WBC 10.1 04/26/2022 0503   RBC 4.15 (L) 04/26/2022 0503   HGB 12.7 (L) 04/26/2022 0503   HGB 15.4 04/24/2022 1033   HCT 37.2 (L) 04/26/2022 0503   HCT 44.9 04/24/2022 1033   PLT 175 04/26/2022 0503   PLT 189 04/24/2022 1033   MCV 89.6 04/26/2022 0503   MCV 89 04/24/2022 1033   MCH 30.6 04/26/2022 0503   MCHC 34.1 04/26/2022 0503   RDW 13.2 04/26/2022 0503   RDW 14.2 04/24/2022 1033   LYMPHSABS 2.1 04/24/2022 1033   EOSABS 0.1 04/24/2022 1033   BASOSABS 0.0 04/24/2022 1033    BMET    Component Value Date/Time   NA 138 04/26/2022 0503   NA 141 04/24/2022 1033   K 3.7 04/26/2022 0503   CL 110 04/26/2022 0503   CO2 19 (L) 04/26/2022 0503   GLUCOSE 106 (H) 04/26/2022 0503   BUN 15 04/26/2022 0503   BUN 20 04/24/2022 1033   CREATININE 1.15 04/26/2022 0503   CALCIUM 8.8 (L) 04/26/2022 0503   GFRNONAA >60 04/26/2022 0503   GFRAA 83 08/05/2020 1121    INR No results found for: "INR"   Intake/Output Summary (Last 24 hours) at 04/26/2022 0748 Last data filed at 04/25/2022 1900 Gross per 24 hour  Intake 1351.74 ml  Output --  Net 1351.74 ml     Assessment/Plan:  76 y.o. male is s/p cardiac catheterization with post operative forearm hematoma 1 Day Post-Op   Subjectively, R forearm is less tight and pain has improved.  He has multiphasic radial, ulnar, and palmar arch signals by doppler.  His sensation is intact; he does have some grip strength weakness but not drastically  different from L hand.  Dr. Trula Slade will evaluate the patient later this morning.   Jeffery Ligas, PA-C [facility] [phone] 04/26/2022 7:48 AM

## 2022-04-27 ENCOUNTER — Encounter: Payer: Medicare Other | Admitting: Psychology

## 2022-04-27 ENCOUNTER — Telehealth: Payer: Self-pay | Admitting: Internal Medicine

## 2022-04-27 LAB — LIPOPROTEIN A (LPA): Lipoprotein (a): 34.2 nmol/L — ABNORMAL HIGH (ref <75.0)

## 2022-04-27 NOTE — Telephone Encounter (Signed)
Patient calling to see if both appts is needed on 11/1 and 11/29. Please advise

## 2022-04-27 NOTE — Telephone Encounter (Signed)
Called pt left a voice mail notifying pt of MD suggestion that keep 05/03/22 OV with Ann Maki, NP.  Pt developed hematoma during cath; MD would like arm to be assessed.  Asked the pt to call back with questions or concerns.

## 2022-05-03 ENCOUNTER — Ambulatory Visit: Payer: Medicare Other | Attending: Nurse Practitioner | Admitting: Nurse Practitioner

## 2022-05-03 ENCOUNTER — Encounter: Payer: Self-pay | Admitting: Nurse Practitioner

## 2022-05-03 VITALS — BP 98/78 | HR 68 | Ht 74.0 in | Wt 186.6 lb

## 2022-05-03 DIAGNOSIS — I700 Atherosclerosis of aorta: Secondary | ICD-10-CM | POA: Insufficient documentation

## 2022-05-03 DIAGNOSIS — E785 Hyperlipidemia, unspecified: Secondary | ICD-10-CM | POA: Insufficient documentation

## 2022-05-03 DIAGNOSIS — Z9889 Other specified postprocedural states: Secondary | ICD-10-CM | POA: Diagnosis not present

## 2022-05-03 DIAGNOSIS — I97630 Postprocedural hematoma of a circulatory system organ or structure following a cardiac catheterization: Secondary | ICD-10-CM

## 2022-05-03 DIAGNOSIS — I10 Essential (primary) hypertension: Secondary | ICD-10-CM | POA: Insufficient documentation

## 2022-05-03 DIAGNOSIS — I25110 Atherosclerotic heart disease of native coronary artery with unstable angina pectoris: Secondary | ICD-10-CM | POA: Diagnosis not present

## 2022-05-03 DIAGNOSIS — S40021A Contusion of right upper arm, initial encounter: Secondary | ICD-10-CM | POA: Diagnosis not present

## 2022-05-03 MED ORDER — CLOPIDOGREL BISULFATE 75 MG PO TABS: 75.0000 mg | ORAL_TABLET | Freq: Every day | ORAL | 3 refills | Status: DC

## 2022-05-03 NOTE — Progress Notes (Signed)
Cardiology Office Note:    Date:  05/03/2022   ID:  Jeffery Kirby, DOB [birth year], MRN [id number]  PCP:  Ginger Organ., MD   [facility] Providers Cardiologist:  Werner Lean, MD     Referring MD: Ginger Organ., MD   Chief Complaint: follow-up PCI  History of Present Illness:    Jeffery Kirby is a very pleasant 76 y.o. male with a hx of HTN, HLD, CAD with DES to LAD.   He established care with cardiology and was seen by Dr. Glenford Bayley on 07/13/2020 for chest pain with exertion that radiated into his neck.  He underwent cardiac CTA which showed multivessel disease.  Left heart cath revealed large especially nonobstructive disease, most severe lesion 95% stenosis involving apical LAD not well suited for PCI given distal location and vessel size.  Low normal LVEF 50 to 55% and mildly elevated filling pressures LVEDP 20 mmHg. Metoprolol tartrate 25 mg twice daily was added.  Seen by Dr. Glenford Bayley 03/08/2022 with worsening fatigue, new dizziness and lightheadedness with standing.  Having tightness and heaviness in his chest, tightness when eating.  Works out a few days a week and has chest tightness on the treadmill. Brother has hx of VT. he was advised to use sublingual nitroglycerin as needed and to start Ranexa 500 mg twice daily and to follow-up in 2-3 months.  Seen in follow-up by Dr. Gasper Sells 04/20/22 at which time he reported worsening chest pain.  Unable to do much because of orthostasis, cannot do steps or walk up hills.  Has taken nitroglycerin which improved but did not resolve chest pain.  Ranolazine made him more dizzy. Scheduled for repeat cardiac catheterization. LHC 04/25/22 revealed distal LAD lesion 95% stenosis, unchanged from prior and too distal for intervention.  Proximal LAD lesion 50% stenosed.  RFR abnormal, 0.83 indicating significant flow reduction in this territory.  DES was placed.  Mild to moderate disease in proximal Cx, first  diagonal, and first LPL.  LVEF normal.  He had typical anginal symptoms with balloon inflations. Plan to gradually reduce antianginal therapy.  Today, he is here with his wife for follow-up. Is having constant right arm pain since cath. Chest pain that he previously described to Dr. Gasper Sells has improved. Has occasional tightness in chest that occurs intermittently, most notably while sitting and watching tv.  No orthopnea, PND, palpitations. Occasional lightheadedness with position changes.  No presyncope, syncope.  Mostly concerned about right arm swelling and bruising. I spent several minutes examining his arm and explaining the progression of the bruising and swelling. He has not been very mobile and has been guarding his arm for the past week.   Past Medical History:  Diagnosis Date   Angina pectoris ([place])    Cataract    Exertional chest pain    Hyperlipidemia    Hypertension    Shingles 2012   Tinnitus     Past Surgical History:  Procedure Laterality Date   COLONOSCOPY     CORONARY STENT INTERVENTION N/A 04/25/2022   Procedure: CORONARY STENT INTERVENTION;  Surgeon: Jettie Booze, MD;  Location: [place] [facility];  Service: Cardiovascular;  Laterality: N/A;   INTRAVASCULAR PRESSURE WIRE/FFR STUDY N/A 04/25/2022   Procedure: INTRAVASCULAR PRESSURE WIRE/FFR STUDY;  Surgeon: Jettie Booze, MD;  Location: [place] [facility];  Service: Cardiovascular;  Laterality: N/A;   LEFT HEART CATH AND CORONARY ANGIOGRAPHY N/A 08/16/2020   Procedure: LEFT HEART CATH AND CORONARY ANGIOGRAPHY;  Surgeon:  End, Harrell Gave, MD;  Location: [place] [facility];  Service: Cardiovascular;  Laterality: N/A;   LEFT HEART CATH AND CORONARY ANGIOGRAPHY N/A 04/25/2022   Procedure: LEFT HEART CATH AND CORONARY ANGIOGRAPHY;  Surgeon: Jettie Booze, MD;  Location: [place] [facility];  Service: Cardiovascular;  Laterality: N/A;   ROTATOR CUFF REPAIR Bilateral    wisdom teeth      Current  Medications: Current Meds  Medication Sig   alum hydroxide-mag trisilicate (GAVISCON) 47-65 MG CHEW chewable tablet Chew 2 tablets by mouth daily as needed for indigestion or heartburn.   aspirin EC 81 MG tablet Take 1 tablet (81 mg total) by mouth daily. Swallow whole.   Cyanocobalamin (B-12) 1000 MCG SUBL Place 1,000 mcg under the tongue 2 (two) times a week.   metoprolol tartrate (LOPRESSOR) 25 MG tablet Take 0.5 tablets (12.5 mg total) by mouth 2 (two) times daily.   nitroGLYCERIN (NITROSTAT) 0.4 MG SL tablet Place 1 tablet (0.4 mg total) under the tongue every 5 (five) minutes as needed for chest pain.   pantoprazole (PROTONIX) 40 MG tablet Take 1 tablet (40 mg total) by mouth daily.   Polyethyl Glycol-Propyl Glycol (SYSTANE OP) Place 1 drop into both eyes daily as needed (dry eyes).   ranolazine (RANEXA) 500 MG 12 hr tablet Take 500 mg by mouth 2 (two) times daily.   rosuvastatin (CRESTOR) 40 MG tablet Take 1 tablet (40 mg total) by mouth daily.   [DISCONTINUED] clopidogrel (PLAVIX) 75 MG tablet Take 1 tablet (75 mg total) by mouth daily.     Allergies:   Patient has no known allergies.   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  Tobacco Use   Smoking status: Never   Smokeless tobacco: Never  Vaping Use   Vaping Use: Never used  Substance and Sexual Activity   Alcohol use: Yes    Comment: occasional beer   Drug use: Not Currently   Sexual activity: Not on file  Other Topics Concern   Not on file  Social History Narrative   Right handed   Lives with wife    Retired   Investment banker, operational of Health   Financial Resource Strain: Not on file  Food Insecurity: No [place] (04/25/2022)   Hunger Vital Sign     Worried About Running Out of Food in the Last Year: Never true    Ran Out of Food in the Last Year: Never true  Transportation Needs: No Transportation Needs (04/25/2022)   PRAPARE - Hydrologist (Medical): No    Lack of Transportation (Non-Medical): No  Physical Activity: Not on file  Stress: Not on file  Social Connections: Not on file     Family History: The patient's family history includes COPD in his mother; Diabetes in his mother; Heart attack in his father, sister, and sister; Kidney disease in his mother. There is no history of Colon cancer, Colon polyps, Esophageal cancer, Rectal cancer, or Stomach cancer.  ROS:   Please see the history of present illness.    + RUE pain and bruising  All other systems reviewed and are negative.  Labs/Other Studies Reviewed:    The following  studies were reviewed today:    Dist LAD lesion is 95% stenosed.  Unchanged from prior and too distal for intervention.   Ost Cx to Prox Cx lesion is 20% stenosed.   1st Diag lesion is 50% stenosed.   1st LPL lesion is 40% stenosed.   Prox LAD lesion is 50% stenosed.  RFR abnormal, 0.83 indicating significant flow reduction in this territory.   A drug-eluting stent was successfully placed using a SYNERGY XD [id number], postdilated to 3.25 mm.   Post intervention, there is a 0% residual stenosis.   The left ventricular systolic function is normal.   LV end diastolic pressure is normal.   The left ventricular ejection fraction is 55-65% by visual estimate.   There is no aortic valve stenosis.   Continue dual antiplatelet therapy along with aggressive secondary prevention.  He had typical anginal symptoms with balloon inflations.  Should be able to gradually decrease antianginal therapy starting with ranolazine.  He does still have distal LAD disease which is not amenable to PCI.   Diagnostic Dominance: Co-dominant  Intervention     LHC  08/16/20  Conclusions: Multivessel coronary artery disease, as detailed below.  Most severe lesion is a 95% stenosis involving the apical LAD.  There are also 50% stenoses involving the proximal LAD and small D1 branch, as well as 40% lPL1 stenosis and moderate diffuse disease involving the RCA. Low normal left ventricular systolic function (LVEF 70-96%) with mildly elevated filling pressure (LVEDP ~20 mmHg).   Recommendations: Optimize medical therapy; 95% apical LAD stenosis is not well-suited for PCI given distal location and vessel size.  Will add metoprolol tartrate 25 mg twice daily. Aggressive secondary prevention of coronary artery disease.  Recent Labs: 04/26/2022: BUN 15; Creatinine, Ser 1.15; Hemoglobin 12.7; Platelets 175; Potassium 3.7; Sodium 138  Recent Lipid Panel    Component Value Date/Time   CHOL 116 11/23/2020 0811   TRIG 73 11/23/2020 0811   HDL 52 11/23/2020 0811   CHOLHDL 2.2 11/23/2020 0811   LDLCALC 49 11/23/2020 0811     Risk Assessment/Calculations:      Physical Exam:    VS:  BP 98/78 (BP Location: Left Arm, Patient Position: Sitting, Cuff Size: Large)   Pulse 68   Ht '6\' 2"'$  (1.88 m)   Wt 186 lb 9.6 oz (84.6 kg)   SpO2 98%   BMI 23.96 kg/m     Wt Readings from Last 3 Encounters:  05/03/22 186 lb 9.6 oz (84.6 kg)  04/25/22 190 lb (86.2 kg)  04/20/22 190 lb (86.2 kg)     GEN:  Well nourished, well developed in no acute distress HEENT: Normal NECK: No JVD; No carotid bruits CARDIAC: RRR, no murmurs, rubs, gallops RESPIRATORY:  Clear to auscultation without rales, wheezing or rhonchi  ABDOMEN: Soft, non-tender, non-distended MUSCULOSKELETAL:  RUE with diffuse bruising.  2+ radial/brachial pulses. Skin warm and pink. 2+ pedal pulses, equal bilaterally, no LE edema SKIN: Warm and dry NEUROLOGIC:  Alert and oriented x 3 PSYCHIATRIC:  Normal affect   EKG:  EKG is ordered today.  The ekg ordered today demonstrates sinus rhythm with occasional PVC,  nonspecific ST abnormality  Diagnoses:    1. Coronary artery disease involving native coronary artery of native heart with unstable angina pectoris ([place])   2. S/P cardiac cath   3. Aortic atherosclerosis ([place])   4. Essential hypertension   5. Hyperlipidemia LDL goal <70   6. Superficial bruising of arm, right, initial encounter  Assessment and Plan:     CAD s/p PCI/DES to LAD: Worsening angina at office visit 04/20/2022, scheduled for repeat [facility] 10/24. Prox LAD lesion 50% stenosed with abnormal flow, improvement in angina with balloon inflation with successful DES placement. dLAD occlusion not amendable to PCI. Mild disease in Cx, 1st LPL 40%.  He is feeling significant improvement.  Continues to feel occasional chest pressure but it is not exacerbated by exertion. Encouraged him to gradually increase activity. Will continue antianginal therapy with Ranexa and metoprolol at this time.  He has follow-up appointment with Dr. Gasper Sells in 4 weeks.    Right arm pain/bruising: Activity has been significantly limited since LHC on 10/24 due to pain and bruising in right arm s/p LHC via right radial artery.  Was seen by [facility] during hospitalization, felt to be stable. Skin is warm and pink, good capillary refill. Appropriate perfusion on exam today. Bruising appears to be in appropriate staging. He has been barely moving his arm. Lengthy discussion about the progression of bruising and swelling. Encouraged him to gradually increase movement and lifting.  Advised him to notify us with concerns.  Hyperlipidemia LDL goal < 70: Normal lipoprotein a 04/26/2022. LDL 63 on 07/14/21. Continue healthy diet low in sodium, process foods, and saturated fats. Continue rosuvastatin.   Hypertension: BP is well-controlled, a little soft since PCI. Mild lightheadedness with position changes.  He is only on low-dose metoprolol which I would like to continue due to recent PCI. Encouraged gradual increase in activity,  adequate nutrition and hydration and continue to monitor BP.     Cardiac Rehabilitation Eligibility Assessment  The patient is ready to start cardiac rehabilitation from a cardiac standpoint.     Disposition: Keep your 11/29 appointment with Dr. Gasper Sells  Medication Adjustments/Labs and Tests Ordered: Current medicines are reviewed at length with the patient today.  Concerns regarding medicines are outlined above.  Orders Placed This Encounter  Procedures   EKG 12-Lead   Meds ordered this encounter  Medications   clopidogrel (PLAVIX) 75 MG tablet    Sig: Take 1 tablet (75 mg total) by mouth daily.    Dispense:  90 tablet    Refill:  3    Patient Instructions  Medication Instructions:   Your physician recommends that you continue on your current medications as directed. Please refer to the Current Medication list given to you today.   *If you need a refill on your cardiac medications before your next appointment, please call your pharmacy*   Lab Work:  None ordered.  If you have labs (blood work) drawn today and your tests are completely normal, you will receive your results only by: [place] (if you have MyChart) OR A paper copy in the mail If you have any lab test that is abnormal or we need to change your treatment, we will call you to review the results.   Testing/Procedures:  None ordered.   Follow-Up: At [facility], you and your health needs are our priority.  As part of our continuing mission to provide you with exceptional heart care, we have created designated Provider Care Teams.  These Care Teams include your primary Cardiologist (physician) and Advanced Practice Providers (APPs -  Physician Assistants and Nurse Practitioners) who all work together to provide you with the care you need, when you need it.  We recommend signing up for the patient portal called "MyChart".  Sign up information is provided on this After Visit Summary.   MyChart is  used to connect with patients for Virtual Visits (Telemedicine).  Patients are able to view lab/test results, encounter notes, upcoming appointments, etc.  Non-urgent messages can be sent to your provider as well.   To learn more about what you can do with MyChart, go to [email or web address].    Your next appointment:   3 week(s)  The format for your next appointment:   In Person  Provider:   Werner Lean, MD     Important Information About Sugar         Signed, Emmaline Life, NP  05/03/2022 1:42 PM    [place]

## 2022-05-03 NOTE — Patient Instructions (Signed)
Medication Instructions:   Your physician recommends that you continue on your current medications as directed. Please refer to the Current Medication list given to you today.   *If you need a refill on your cardiac medications before your next appointment, please call your pharmacy*   Lab Work:  None ordered.  If you have labs (blood work) drawn today and your tests are completely normal, you will receive your results only by: [place] (if you have MyChart) OR A paper copy in the mail If you have any lab test that is abnormal or we need to change your treatment, we will call you to review the results.   Testing/Procedures:  None ordered.   Follow-Up: At [facility], you and your health needs are our priority.  As part of our continuing mission to provide you with exceptional heart care, we have created designated Provider Care Teams.  These Care Teams include your primary Cardiologist (physician) and Advanced Practice Providers (APPs -  Physician Assistants and Nurse Practitioners) who all work together to provide you with the care you need, when you need it.  We recommend signing up for the patient portal called "MyChart".  Sign up information is provided on this After Visit Summary.  MyChart is used to connect with patients for Virtual Visits (Telemedicine).  Patients are able to view lab/test results, encounter notes, upcoming appointments, etc.  Non-urgent messages can be sent to your provider as well.   To learn more about what you can do with MyChart, go to [email or web address].    Your next appointment:   3 week(s)  The format for your next appointment:   In Person  Provider:   Werner Lean, MD     Important Information About Sugar

## 2022-05-04 ENCOUNTER — Encounter: Payer: Medicare Other | Admitting: Psychology

## 2022-05-05 ENCOUNTER — Telehealth: Payer: Self-pay | Admitting: Internal Medicine

## 2022-05-05 NOTE — Telephone Encounter (Signed)
Patient states post 10/24 stent placement he is still having a lot of pain in his arm where the stent was placed and pain to touch in several other areas on his arm. He states sometimes the pain gets up to a 9 or 10. He would like to know if something can be prescribed to make the pain more bearable. Please advise.

## 2022-05-05 NOTE — Telephone Encounter (Signed)
Discussed w/ Christen Bame, NP who saw pt 2 days ago. Pt reports he has been taking Tylenol 1000 mg TID as instructed, with no relief. States in the hospital they had to give him Dilaudid for the pain b/c morphine did not work/help. States he has been moving the arm as instructed. Informed pt that call was discussed w/ NP who saw him 2 days ago.  Reminded that evaluation on that arm 2 days ago and in the hospital by vascular -- nothing has been found to the reason/cause of this pain pt describes.  He reports bruising from his "wrist to my shoulder" and questions this being normal.  Informed that is it (he chuckled as if he did not believe/trust this statement), and NP stated she looked at his arm 2 days ago (pt was favoring/holding this arm when he came in for appt) - bruising was normal. Advised to follow up w/ PCP about his arm as he was cleared from vascular/cardiology standpoint on this matter. Patient verbalized understanding and agreeable to plan.  Will forward last OV and this note to PCP office.

## 2022-05-10 NOTE — Telephone Encounter (Signed)
Pt present for 05/03/22 OV with [place], NP.

## 2022-05-30 ENCOUNTER — Other Ambulatory Visit (HOSPITAL_COMMUNITY): Payer: Self-pay

## 2022-05-30 NOTE — Progress Notes (Unsigned)
Cardiology Office Note:    Date:  05/31/2022   ID:  Jeffery Kirby, DOB 1945-11-07, MRN [id number]  PCP:  Ginger Organ., MD  [facility] Cardiologist: Rudean Haskell MD [facility] Electrophysiologist:  None   CC: CAD f/u  History of Present Illness:    Jeffery Kirby is a 76 y.o. male with a HTN and HLD who presented for evaluation 07/13/20.  2022: Cardiac CT which showed multi-vessel disease; patient GDJ:MEQASTM non obstructive disease save for with obstructive apical disease. With medical therapy, lightheaded on BB.   2023: started dietary changes for CAD secondary prevention.  Had worsening CP.  R Radial approach PCI of LAD.  He had improvement in angina but notable right arm pain.   Saw VVS.  Saw APP in 05/03/22.  Patient notes that he is doing better from chest pain.   Still gets chest tightness that is different from the chest pain. Still gets lightheaded and dizzy with exertion. Still gets shortness of breath with exertion. Arm pain has improved. Hematoma resolved   Past Medical History:  Diagnosis Date   Angina pectoris ([place])    Cataract    Exertional chest pain    Hyperlipidemia    Hypertension    Shingles 2012   Tinnitus     Past Surgical History:  Procedure Laterality Date   COLONOSCOPY     CORONARY STENT INTERVENTION N/A 04/25/2022   Procedure: CORONARY STENT INTERVENTION;  Surgeon: Jettie Booze, MD;  Location: [place] [facility];  Service: Cardiovascular;  Laterality: N/A;   INTRAVASCULAR PRESSURE WIRE/FFR STUDY N/A 04/25/2022   Procedure: INTRAVASCULAR PRESSURE WIRE/FFR STUDY;  Surgeon: Jettie Booze, MD;  Location: [place] [facility];  Service: Cardiovascular;  Laterality: N/A;   LEFT HEART CATH AND CORONARY ANGIOGRAPHY N/A 08/16/2020   Procedure: LEFT HEART CATH AND CORONARY ANGIOGRAPHY;  Surgeon: Nelva Bush, MD;  Location: [place] [facility];  Service: Cardiovascular;  Laterality: N/A;   LEFT HEART CATH AND CORONARY  ANGIOGRAPHY N/A 04/25/2022   Procedure: LEFT HEART CATH AND CORONARY ANGIOGRAPHY;  Surgeon: Jettie Booze, MD;  Location: [place] [facility];  Service: Cardiovascular;  Laterality: N/A;   ROTATOR CUFF REPAIR Bilateral    wisdom teeth      Current Medications: Current Meds  Medication Sig   alum hydroxide-mag trisilicate (GAVISCON) 19-62 MG CHEW chewable tablet Chew 2 tablets by mouth daily as needed for indigestion or heartburn.   aspirin EC 81 MG tablet Take 1 tablet (81 mg total) by mouth daily. Swallow whole.   clopidogrel (PLAVIX) 75 MG tablet Take 1 tablet (75 mg total) by mouth daily.   Cyanocobalamin (B-12) 1000 MCG SUBL Place 1,000 mcg under the tongue 2 (two) times a week.   metoprolol tartrate (LOPRESSOR) 25 MG tablet Take 0.5 tablets (12.5 mg total) by mouth 2 (two) times daily.   nitroGLYCERIN (NITROSTAT) 0.4 MG SL tablet Place 1 tablet (0.4 mg total) under the tongue every 5 (five) minutes as needed for chest pain.   pantoprazole (PROTONIX) 40 MG tablet Take 1 tablet (40 mg total) by mouth daily.   Polyethyl Glycol-Propyl Glycol (SYSTANE OP) Place 1 drop into both eyes daily as needed (dry eyes).   rosuvastatin (CRESTOR) 40 MG tablet Take 1 tablet (40 mg total) by mouth daily.   [DISCONTINUED] ranolazine (RANEXA) 500 MG 12 hr tablet Take 500 mg by mouth 2 (two) times daily.     Allergies:   Patient has no known allergies.   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  Tobacco Use   Smoking status: Never   Smokeless tobacco: Never  Vaping Use   Vaping Use: Never used  Substance and Sexual Activity   Alcohol use: Yes    Comment: occasional beer   Drug use: Not Currently   Sexual activity: Not on file  Other  Topics Concern   Not on file  Social History Narrative   Right handed   Lives with wife    Retired   Investment banker, operational of Health   Financial Resource Strain: Not on file  Food Insecurity: No [place] (04/25/2022)   Hunger Vital Sign    Worried About Running Out of Food in the Last Year: Never true    Ran Out of Food in the Last Year: Never true  Transportation Needs: No Transportation Needs (04/25/2022)   PRAPARE - Hydrologist (Medical): No    Lack of Transportation (Non-Medical): No  Physical Activity: Not on file  Stress: Not on file  Social Connections: Not on file    Social: [place] [employer] to [facility], married; retired; went to [place] in 2022, going to [place] in 2023  Family History: The patient's family history includes COPD in his mother; Diabetes in his mother; Heart attack in his father, sister, and sister; Kidney disease in his mother. There is no history of Colon cancer, Colon polyps, Esophageal cancer, Rectal cancer, or Stomach cancer. History of coronary artery disease notable for sisters and father. History of heart failure notable for no members. History of arrhythmia notable for no members. Sister died young at the age of 73 (passed in her sleep). Brother has new ICD  ROS:   Please see the history of present illness.     All other systems reviewed and are negative.  EKGs/Labs/Other Studies Reviewed:    The following studies were reviewed today:  EKG:   3/923: SR rate 73 08/24/20: Sinus Bradycardia 1st HB 07/13/20: SR rate 69 1st Hb with Baseline artifact  Cardiac Studies & Procedures   CARDIAC CATHETERIZATION  CARDIAC CATHETERIZATION 04/25/2022  Narrative   Dist LAD lesion is 95% stenosed.  Unchanged from prior and too distal for intervention.   Ost Cx to Prox Cx lesion is 20% stenosed.   1st Diag lesion is 50% stenosed.   1st LPL lesion is 40% stenosed.   Prox LAD lesion is 50% stenosed.  RFR abnormal, 0.83  indicating significant flow reduction in this territory.   A drug-eluting stent was successfully placed using a SYNERGY XD 2.75X24, postdilated to 3.25 mm.   Post intervention, there is a 0% residual stenosis.   The left ventricular systolic function is normal.   LV end diastolic pressure is normal.   The left ventricular ejection fraction is 55-65% by visual estimate.   There is no aortic valve stenosis.  Continue dual antiplatelet therapy along with aggressive secondary prevention.  He had typical anginal symptoms with balloon inflations.  Should be able to gradually decrease antianginal therapy starting with ranolazine.  He does still have distal LAD disease which is not amenable to PCI.  Findings Coronary Findings Diagnostic  Dominance: Co-dominant  Left Main Vessel is large.  Vessel is angiographically normal.  Left Anterior Descending Vessel is large. Prox LAD lesion is 50% stenosed. The lesion is eccentric. The lesion is severely calcified. Pressure wire/FFR was performed on the lesion. RFR 0.83, positive for ischemia Dist LAD lesion is 95% stenosed.  First Diagonal Branch Vessel is small in size. 1st Diag lesion is 50% stenosed.  Second Diagonal Branch Vessel is small in size.  Third Diagonal Branch Vessel is small in size.  Ramus Intermedius Vessel is small. Vessel is angiographically normal.  Left Circumflex Vessel is large. Ost Cx to Prox Cx lesion is 20% stenosed.  First Obtuse Marginal Branch Vessel is small in size.  Second Obtuse Marginal Branch Vessel is moderate in size. The vessel exhibits minimal luminal irregularities.  Third Obtuse Marginal Branch Vessel is moderate in size. The vessel exhibits minimal luminal irregularities.  First Left Posterolateral Branch Vessel is large in size. 1st LPL lesion is 40% stenosed.  Second Left Posterolateral Branch Vessel is small in size.  Third Left Posterolateral Branch Vessel is small in size.  Right  Coronary Artery Vessel is moderate in size. There is moderate diffuse disease throughout the vessel.  Right Posterior Descending Artery Vessel is small in size.  Intervention  Prox LAD lesion Stent CATH LAUNCHER 6FR EBU3.5 guide catheter was inserted. Lesion crossed with guidewire using a GUIDEWIRE PRESSURE X 175. Pre-stent angioplasty was performed using a BALLN WOLVERINE 2.75X10. A drug-eluting stent was successfully placed using a SYNERGY XD 2.75X24. Post-stent angioplasty was performed using a BALL SAPPHIRE NC24 3.25X12. [place] used as a Designer, multimedia.  Initially, a 3.0 x 15 [place] balloon was used to attempt to predilate.  This would not cross the tortuosity into the diseased area of the LAD.  We then used a 2.5 semicompliant balloon to predilate.  The 3.0 Wolverine balloon still would not cross.  We then changed for a 2.75 x 12 [place]olverine balloon which eventually did cross into the diseased area of the proximal LAD.  Multiple inflations were done.  A 2.75 x 24 Synergy drug-eluting stent was then deployed and subsequently postdilated with a 3.25 [place] balloon. Post-Intervention Lesion Assessment The intervention was successful. Pre-interventional TIMI flow is 3. Post-intervention TIMI flow is 3. No complications occurred at this lesion. Additional compression to the forearm performed with blood pressure cuff due to concern for hematoma. There is a 0% residual stenosis post intervention.   CARDIAC CATHETERIZATION  CARDIAC CATHETERIZATION 08/16/2020  Narrative Conclusions: 1. Multivessel coronary artery disease, as detailed below.  Most severe lesion is a 95% stenosis involving the apical LAD.  There are also 50% stenoses involving the proximal LAD and small D1 branch, as well as 40% lPL1 stenosis and moderate diffuse disease involving the RCA. 2. Low normal left ventricular systolic function (LVEF 02-77%) with mildly elevated filling pressure (LVEDP ~20 mmHg).  Recommendations: 1. Optimize  medical therapy; 95% apical LAD stenosis is not well-suited for PCI given distal location and vessel size.  Will add metoprolol tartrate 25 mg twice daily. 2. Aggressive secondary prevention of coronary artery disease.  Nelva Bush, MD [facility]  Findings Coronary Findings Diagnostic  Dominance: Co-dominant  Left Main Vessel is large. Vessel is angiographically normal.  Left Anterior Descending Vessel is large. Prox LAD lesion is 50% stenosed. The lesion is eccentric. The lesion is severely calcified. Dist LAD lesion is 95% stenosed.  First Diagonal Branch Vessel is small in size. 1st Diag lesion is 50% stenosed.  Second Diagonal Branch Vessel is small in size.  Third Diagonal Branch Vessel is small in size.  Ramus Intermedius Vessel is small. Vessel is angiographically normal.  Left Circumflex Vessel is large. Ost Cx to Prox Cx lesion is 20% stenosed.  First Obtuse Marginal Branch Vessel is small in size.  Second Obtuse Marginal Branch Vessel is moderate in size. The vessel exhibits minimal luminal irregularities.  Third Obtuse Marginal Branch Vessel is moderate in size. The vessel exhibits minimal luminal irregularities.  First Left Posterolateral Branch Vessel is large in size. 1st LPL lesion is 40% stenosed.  Second Left Posterolateral Branch Vessel is small in size.  Third Left Posterolateral Branch Vessel is small in size.  Right Coronary Artery Vessel is moderate in size. There is moderate diffuse disease throughout the vessel.  Right Posterior Descending Artery Vessel is small in size.  Intervention  No interventions have been documented.        CT SCANS  CT CORONARY MORPH W/CTA COR W/SCORE 08/10/2020  Addendum 08/10/2020 12:55 PM ADDENDUM REPORT: 08/10/2020 12:52  CLINICAL DATA:  76 Year-old White Male  EXAM: Cardiac/Coronary CTA  TECHNIQUE: The patient was scanned on a [employer].  FINDINGS: A 100 kV  prospective scan was triggered in the descending thoracic aorta at 111 HU's. Axial non-contrast 3 mm slices were carried out through the heart. The data set was analyzed on a dedicated work station and scored using the [place]. Gantry rotation speed was 250 msecs and collimation was .6 mm. No beta blockade and 0.8 mg of sl NTG was given. The 3D data set was reconstructed in 5% intervals of the 67-82 % of the R-R cycle. Diastolic phases were analyzed on a dedicated work station using MPR, MIP and VRT modes. The patient received 100 cc of contrast.  Aorta: Upper limits of normal; ascending aortic size 39 mm, with ascending aortic height index 2.10 cm/m. Descending aortic atherosclerosis noted. No dissection.  Aortic Valve: Tri-leaflet. Multiple small annular calcifications noted. Mild valvular calcification.  Coronary Arteries:  Normal coronary origin.  Right dominance.  Coronary calcium score of 1724. This was 90th percentile for age, sex, and race matched control.  RCA is a large dominant artery that gives rise to PDA and PLA. There is a 70-99% mixed plaque stenosis, with a subsequent 50-70% mixed plaque stenosis in the proximal vessel, there is a 50-70% mixed plaque stenosis in the mid vessel, there is a 70-90% calcified plaque stenosis in the distal vessel. In addition there are minimal non-obstructive calcified plaques scattered through the mid/distal vessel and its branches.  Left main is a large artery that gives rise to LAD and LCX arteries. The ostia of the left main appear aneurysmal measuring 16 mm with mild calcification. This narrows to 5 mm by the distal vessel. There is 30% distal stenosis with a calcified plaque with napkin ring sign (high risk feature).  LAD is a large vessel that gives off two diagonal vessels. There is an ostial 50-70% calcified stenosis narrowing to a 70-99% mixed plaque stenosis in the proximal vessel with 50-70% calcified  plaque stenosis in the mid vessel. There is a mild non obstructive (25-49%) calcified plaque in the ostium of the D1 vessel. There is a mild non obstructive (25-49%) calcified plaque in the ostium of the distal vessel.  LCX is a non-dominant artery that gives rise to four obtuse marginal vessels including a large OM4 branch that bifurcates. There is moderate (50-70%) calcified stenosis in the proximal vessel with multiple minimal non obstructive (1-24%) calcified plaques in  the mid and distant vessel. There is a minimal non obstructive (1-24%) calcified plaque in the of the OM1 vessel. There is a minimal non obstructive (1-24%) soft plaque in the of the OM3 vessel. There is a mild non obstructive (25-49%) calcified plaque in the OM4 vessel prior to the bifurcation.  Other findings:  Normal pulmonary vein drainage into the left atrium.  Normal left atrial appendage without a thrombus.  Normal size of the pulmonary artery.  Extra-cardiac findings: See attached radiology report for non-cardiac structures.  IMPRESSION: 1. Coronary calcium score of 1724. This was 90th percentile for age, sex, and race matched control.  2. Normal coronary origin with right dominance.  3. CAD-RADS 4 Severe stenosis. (70-99% or > 50% left main). There is left main, RCA, LAD, and LCX disease. Cardiac catheterization or CT FFR is recommended. Consider symptom-guided anti-ischemic pharmacotherapy as well as risk factor modification per guideline directed care.  4.  Left main appears aneurysmal: Ostial diameter measured at 16 mm.  5. Upper limits of normal ascending aortic size 39 mm, with ascending aortic height index 2.10 cm/m.  6.  Aortic atherosclerosis noted.  7.  Mild aortic valve calcification and mild annular calcification.   Electronically Signed By: Rudean Haskell MD On: 08/10/2020 12:52  Narrative EXAM: OVER-READ INTERPRETATION  CT CHEST  The following report is an  over-read performed by radiologist Dr. Aletta Edouard of [facility], [place] on 08/10/2020. This over-read does not include interpretation of cardiac or coronary anatomy or pathology. The coronary CTA interpretation by the cardiologist is attached.  COMPARISON:  None.  FINDINGS: Vascular: No significant vascular findings. Normal heart size. No pericardial effusion.  Mediastinum/Nodes: Visualized mediastinum and hilar regions demonstrate calcified right hilar and subcarinal lymph nodes consistent with prior granulomatous disease.  Lungs/Pleura: Visualized lungs show no evidence of pulmonary edema, consolidation, pneumothorax, nodule or pleural fluid.  Upper Abdomen: No acute abnormality.  Musculoskeletal: No chest wall mass or suspicious bone lesions identified.  IMPRESSION: Calcified right hilar and subcarinal lymph nodes consistent with prior granulomatous disease.  Electronically Signed: By: Aletta Edouard M.D. On: 08/10/2020 11:17           Recent Labs: 04/26/2022: BUN 15; Creatinine, Ser 1.15; Hemoglobin 12.7; Platelets 175; Potassium 3.7; Sodium 138  Recent Lipid Panel    Component Value Date/Time   CHOL 116 11/23/2020 0811   TRIG 73 11/23/2020 0811   HDL 52 11/23/2020 0811   CHOLHDL 2.2 11/23/2020 0811   LDLCALC 49 11/23/2020 0811    Physical Exam:    VS:  BP 112/60   Pulse 65   Ht '6\' 2"'$  (1.88 m)   Wt 186 lb (84.4 kg)   SpO2 97%   BMI 23.88 kg/m     Wt Readings from Last 3 Encounters:  05/31/22 186 lb (84.4 kg)  05/03/22 186 lb 9.6 oz (84.6 kg)  04/25/22 190 lb (86.2 kg)    Gen: no distress   Neck: No JVD Cardiac: No Rubs or Gallops no systolic murmur bruising from LHC has resolved (he brought pictures from the original) Respiratory: Clear to auscultation bilaterally, normal effort, normal  respiratory rate GI: Soft, nontender, non-distended  MS: No  edema;  moves all extremities Integument: Skin feels war Neuro:  At time of evaluation,  alert and oriented to person/place/time/situation Psych: Normal affect, patient feels well  ASSESSMENT:    1. Coronary artery disease of native artery of native heart with stable angina pectoris ([place])   2. Essential hypertension  3. Aortic atherosclerosis ([place])   4. Mixed hyperlipidemia     PLAN:    Coronary Artery Disease; Obstructive - Chronic stable angina Presyncope Hyperlipidemia (mixed) Aortic Atherosclerosis HTN FHX of VT younger brother - continue ASA and plavix likely for one year (microvascular disease) - continue PRN nitro (rarely needed) - will try stopping  ranolazine 500 mg PO BID (had dizziness) - on rosuvastatin to 40 mg, goal LDL < 70 - we will plan to start cardiac rehab - if this does not work we will try CBTI for CP  April-May f/u me      Medication Adjustments/Labs and Tests Ordered: Current medicines are reviewed at length with the patient today.  Concerns regarding medicines are outlined above.  No orders of the defined types were placed in this encounter.   No orders of the defined types were placed in this encounter.    Patient Instructions  Medication Instructions:  STOP Ranexa *If you need a refill on your cardiac medications before your next appointment, please call your pharmacy*   Lab Work: NONE If you have labs (blood work) drawn today and your tests are completely normal, you will receive your results only by: [place] (if you have MyChart) OR A paper copy in the mail If you have any lab test that is abnormal or we need to change your treatment, we will call you to review the results.   Testing/Procedures: Cardiac Rehab @ [facility]: [phone]  Follow-Up: At [facility], you and your health needs are our priority.  As part of our continuing mission to provide you with exceptional heart care, we have created designated Provider Care Teams.  These Care Teams include your primary Cardiologist (physician) and  Advanced Practice Providers (APPs -  Physician Assistants and Nurse Practitioners) who all work together to provide you with the care you need, when you need it.  We recommend signing up for the patient portal called "MyChart".  Sign up information is provided on this After Visit Summary.  MyChart is used to connect with patients for Virtual Visits (Telemedicine).  Patients are able to view lab/test results, encounter notes, upcoming appointments, etc.  Non-urgent messages can be sent to your provider as well.   To learn more about what you can do with MyChart, go to [email or web address].    Your next appointment:   10/29/21 '@9'$ :20am  The format for your next appointment:   In Person  Provider:   Werner Lean, MD       Important Information About Sugar         Signed, Werner Lean, MD  05/31/2022 10:16 AM    [place]

## 2022-05-31 ENCOUNTER — Encounter: Payer: Self-pay | Admitting: Internal Medicine

## 2022-05-31 ENCOUNTER — Ambulatory Visit: Payer: Medicare Other | Attending: Internal Medicine | Admitting: Internal Medicine

## 2022-05-31 VITALS — BP 112/60 | HR 65 | Ht 74.0 in | Wt 186.0 lb

## 2022-05-31 DIAGNOSIS — I10 Essential (primary) hypertension: Secondary | ICD-10-CM | POA: Insufficient documentation

## 2022-05-31 DIAGNOSIS — E782 Mixed hyperlipidemia: Secondary | ICD-10-CM | POA: Insufficient documentation

## 2022-05-31 DIAGNOSIS — I25118 Atherosclerotic heart disease of native coronary artery with other forms of angina pectoris: Secondary | ICD-10-CM | POA: Diagnosis not present

## 2022-05-31 DIAGNOSIS — I700 Atherosclerosis of aorta: Secondary | ICD-10-CM | POA: Insufficient documentation

## 2022-05-31 NOTE — Patient Instructions (Signed)
Medication Instructions:  STOP Ranexa *If you need a refill on your cardiac medications before your next appointment, please call your pharmacy*   Lab Work: NONE If you have labs (blood work) drawn today and your tests are completely normal, you will receive your results only by: [place] (if you have MyChart) OR A paper copy in the mail If you have any lab test that is abnormal or we need to change your treatment, we will call you to review the results.   Testing/Procedures: Cardiac Rehab @ [facility]: [phone]  Follow-Up: At [facility], you and your health needs are our priority.  As part of our continuing mission to provide you with exceptional heart care, we have created designated Provider Care Teams.  These Care Teams include your primary Cardiologist (physician) and Advanced Practice Providers (APPs -  Physician Assistants and Nurse Practitioners) who all work together to provide you with the care you need, when you need it.  We recommend signing up for the patient portal called "MyChart".  Sign up information is provided on this After Visit Summary.  MyChart is used to connect with patients for Virtual Visits (Telemedicine).  Patients are able to view lab/test results, encounter notes, upcoming appointments, etc.  Non-urgent messages can be sent to your provider as well.   To learn more about what you can do with MyChart, go to [email or web address].    Your next appointment:   10/29/21 '@9'$ :20am  The format for your next appointment:   In Person  Provider:   Werner Lean, MD       Important Information About Sugar

## 2022-06-02 ENCOUNTER — Ambulatory Visit: Payer: Medicare Other | Admitting: Internal Medicine

## 2022-06-05 ENCOUNTER — Encounter: Payer: Self-pay | Admitting: Internal Medicine

## 2022-07-24 DIAGNOSIS — R7989 Other specified abnormal findings of blood chemistry: Secondary | ICD-10-CM | POA: Diagnosis not present

## 2022-07-24 DIAGNOSIS — Z125 Encounter for screening for malignant neoplasm of prostate: Secondary | ICD-10-CM | POA: Diagnosis not present

## 2022-07-24 DIAGNOSIS — E785 Hyperlipidemia, unspecified: Secondary | ICD-10-CM | POA: Diagnosis not present

## 2022-07-24 DIAGNOSIS — R7301 Impaired fasting glucose: Secondary | ICD-10-CM | POA: Diagnosis not present

## 2022-07-24 DIAGNOSIS — I10 Essential (primary) hypertension: Secondary | ICD-10-CM | POA: Diagnosis not present

## 2022-07-27 DIAGNOSIS — R82998 Other abnormal findings in urine: Secondary | ICD-10-CM | POA: Diagnosis not present

## 2022-07-28 ENCOUNTER — Telehealth (HOSPITAL_COMMUNITY): Payer: Self-pay

## 2022-07-28 NOTE — Telephone Encounter (Signed)
Pt insurance is active and benefits verified through Medicare a/b Co-pay 0, DED $240/0 met, out of pocket 0/0 met, co-insurance 20%. no pre-authorization required. Passport, 07/28/2022'@12'$ :16pm, REF# [id number]   2ndary insurance is active and benefits verified through [place]. Co-pay 0, DED 0/0 met, out of pocket 0/0 met, co-insurance 0%. No pre-authorization required. Passport,    How many CR sessions are covered? (72 sessions for ICR)72 Is this a lifetime maximum or an annual maximum? annual Has the member used any of these services to date? no Is there a time limit (weeks/months) on start of program and/or program completion? no

## 2022-07-28 NOTE — Telephone Encounter (Signed)
Called patient to see if he was interested in participating in the Cardiac Rehab Program. Patient stated yes. Patient will come in for orientation on 08/03/22'@9'$ :30am and will attend the 12:30pm exercise class.   Sent information via [employer]

## 2022-07-31 DIAGNOSIS — Z1331 Encounter for screening for depression: Secondary | ICD-10-CM | POA: Diagnosis not present

## 2022-07-31 DIAGNOSIS — G250 Essential tremor: Secondary | ICD-10-CM | POA: Diagnosis not present

## 2022-07-31 DIAGNOSIS — R42 Dizziness and giddiness: Secondary | ICD-10-CM | POA: Diagnosis not present

## 2022-07-31 DIAGNOSIS — I209 Angina pectoris, unspecified: Secondary | ICD-10-CM | POA: Diagnosis not present

## 2022-07-31 DIAGNOSIS — G3184 Mild cognitive impairment, so stated: Secondary | ICD-10-CM | POA: Diagnosis not present

## 2022-07-31 DIAGNOSIS — I2510 Atherosclerotic heart disease of native coronary artery without angina pectoris: Secondary | ICD-10-CM | POA: Diagnosis not present

## 2022-07-31 DIAGNOSIS — I10 Essential (primary) hypertension: Secondary | ICD-10-CM | POA: Diagnosis not present

## 2022-07-31 DIAGNOSIS — Z0000 Encounter for general adult medical examination without abnormal findings: Secondary | ICD-10-CM | POA: Diagnosis not present

## 2022-07-31 DIAGNOSIS — G20C Parkinsonism, unspecified: Secondary | ICD-10-CM | POA: Diagnosis not present

## 2022-07-31 DIAGNOSIS — Z1339 Encounter for screening examination for other mental health and behavioral disorders: Secondary | ICD-10-CM | POA: Diagnosis not present

## 2022-07-31 DIAGNOSIS — R9089 Other abnormal findings on diagnostic imaging of central nervous system: Secondary | ICD-10-CM | POA: Diagnosis not present

## 2022-07-31 DIAGNOSIS — R7301 Impaired fasting glucose: Secondary | ICD-10-CM | POA: Diagnosis not present

## 2022-08-01 ENCOUNTER — Telehealth (HOSPITAL_COMMUNITY): Payer: Self-pay

## 2022-08-01 NOTE — Telephone Encounter (Signed)
LVM for patient to confirm Cardiac Rehab appointment & complete the CR Nursing Profile Assessment with the patient. Awaiting return call

## 2022-08-03 ENCOUNTER — Telehealth (HOSPITAL_COMMUNITY): Payer: Self-pay | Admitting: *Deleted

## 2022-08-03 ENCOUNTER — Encounter (HOSPITAL_COMMUNITY)
Admission: RE | Admit: 2022-08-03 | Discharge: 2022-08-03 | Disposition: A | Discharge status: Home / Self Care | Payer: Medicare Other | Source: Ambulatory Visit | Attending: Internal Medicine | Admitting: Internal Medicine

## 2022-08-03 VITALS — BP 110/70 | HR 80 | Ht 73.5 in | Wt 189.2 lb

## 2022-08-03 DIAGNOSIS — Z48812 Encounter for surgical aftercare following surgery on the circulatory system: Secondary | ICD-10-CM | POA: Diagnosis not present

## 2022-08-03 DIAGNOSIS — R079 Chest pain, unspecified: Secondary | ICD-10-CM

## 2022-08-03 DIAGNOSIS — Z955 Presence of coronary angioplasty implant and graft: Secondary | ICD-10-CM | POA: Diagnosis not present

## 2022-08-03 NOTE — Progress Notes (Signed)
Cardiac Individual Treatment Plan  Patient Details  Name: Jeffery Kirby MRN: [id number] Date of Birth: 1945/12/04 Referring Provider:   Flowsheet Row INTENSIVE CARDIAC REHAB ORIENT from 08/03/2022 in [facility] & [place]  Referring Provider Meryle Ready, MD       Initial Encounter Date:  [place] from 08/03/2022 in [facility], [facility], & [place]  Date 08/03/22       Visit Diagnosis: 04/25/22 Coronary artey stent placement LAD  Chest pain, unspecified type - Plan: EKG - 12 lead, EKG - 12 lead  Patient's Home Medications on Admission:  Current Outpatient Medications:    alum hydroxide-mag trisilicate (GAVISCON) 74-94 MG CHEW chewable tablet, Chew 2 tablets by mouth daily as needed for indigestion or heartburn., Disp: , Rfl:    aspirin EC 81 MG tablet, Take 1 tablet (81 mg total) by mouth daily. Swallow whole., Disp: 90 tablet, Rfl: 3   clopidogrel (PLAVIX) 75 MG tablet, Take 1 tablet (75 mg total) by mouth daily., Disp: 90 tablet, Rfl: 3   Cyanocobalamin (B-12) 1000 MCG SUBL, Place 1,000 mcg under the tongue 2 (two) times a week., Disp: , Rfl:    metoprolol tartrate (LOPRESSOR) 25 MG tablet, Take 0.5 tablets (12.5 mg total) by mouth 2 (two) times daily., Disp: 90 tablet, Rfl: 3   nitroGLYCERIN (NITROSTAT) 0.4 MG SL tablet, Place 1 tablet (0.4 mg total) under the tongue every 5 (five) minutes as needed for chest pain., Disp: 25 tablet, Rfl: 3   pantoprazole (PROTONIX) 40 MG tablet, Take 1 tablet (40 mg total) by mouth daily., Disp: 30 tablet, Rfl: 11   Polyethyl Glycol-Propyl Glycol (SYSTANE OP), Place 1 drop into both eyes daily as needed (dry eyes)., Disp: , Rfl:    rosuvastatin (CRESTOR) 40 MG tablet, Take 1 tablet (40 mg total) by mouth daily., Disp: 90 tablet, Rfl: 3  Past Medical History: Past Medical History:  Diagnosis Date   Angina pectoris  ([place])    Cataract    Exertional chest pain    Hyperlipidemia    Hypertension    Shingles 2012   Tinnitus     Tobacco Use: Social History   Tobacco Use  Smoking Status Never  Smokeless Tobacco Never    Labs: Review Flowsheet       Latest Ref Rng & Units 11/23/2020  Labs for ITP Cardiac and Pulmonary Rehab  Cholestrol 100 - 199 mg/dL 116   LDL (calc) 0 - 99 mg/dL 49   HDL-C >39 mg/dL 52   Trlycerides 0 - 149 mg/dL 73     Capillary Blood Glucose: No results found for: "GLUCAP"   Exercise Target Goals: Exercise Program Goal: Individual exercise prescription set using results from initial 6 min walk test and THRR while considering  patient's activity barriers and safety.   Exercise Prescription Goal: Initial exercise prescription builds to 30-45 minutes a day of aerobic activity, 2-3 days per week.  Home exercise guidelines will be given to patient during program as part of exercise prescription that the participant will acknowledge.  Activity Barriers & Risk Stratification:  Activity Barriers & Cardiac Risk Stratification - 08/03/22 1257       Activity Barriers & Cardiac Risk Stratification   Activity Barriers Back Problems;Deconditioning;Chest Pain/Angina;Balance Concerns;History of Falls;Joint Problems;Neck/Spine Problems    Cardiac Risk Stratification High             6 Minute Walk:  6 Minute  Walk     Row Name 08/03/22 1256         6 Minute Walk   Phase Initial     Distance 1416 feet     Walk Time 6 minutes     # of Rest Breaks 0     MPH 2.7     METS 2.9     RPE 11     Perceived Dyspnea  0     VO2 Peak 10.2     Symptoms No     Resting HR 78 bpm     Resting BP 110/70     Resting Oxygen Saturation  97 %     Exercise Oxygen Saturation  during 6 min walk 99 %     Max Ex. HR 79 bpm     Max Ex. BP 118/70     2 Minute Post BP 112/71              Oxygen Initial Assessment:   Oxygen Re-Evaluation:   Oxygen Discharge (Final Oxygen  Re-Evaluation):   Initial Exercise Prescription:  Initial Exercise Prescription - 08/03/22 1300       Date of Initial Exercise RX and Referring Provider   Date 08/03/22    Referring Provider Meryle Ready, MD    Expected Discharge Date 09/29/22      Bike   Level 1    Watts 25    Minutes 15    METs 2.9      NuStep   Level 1    SPM 75    Minutes 15    METs 2.9      Prescription Details   Frequency (times per week) 3    Duration Progress to 30 minutes of continuous aerobic without signs/symptoms of physical distress      Intensity   THRR 40-80% of Max Heartrate 58-117    Ratings of Perceived Exertion 11-13    Perceived Dyspnea 0-4      Progression   Progression Continue progressive overload as per policy without signs/symptoms or physical distress.      Resistance Training   Training Prescription Yes    Weight 3 lbs    Reps 10-15             Perform Capillary Blood Glucose checks as needed.  Exercise Prescription Changes:   Exercise Comments:   Exercise Goals and Review:   Exercise Goals     Row Name 08/03/22 1258             Exercise Goals   Increase Physical Activity Yes       Intervention Provide advice, education, support and counseling about physical activity/exercise needs.;Develop an individualized exercise prescription for aerobic and resistive training based on initial evaluation findings, risk stratification, comorbidities and participant's personal goals.       Expected Outcomes Short Term: Attend rehab on a regular basis to increase amount of physical activity.;Long Term: Add in home exercise to make exercise part of routine and to increase amount of physical activity.;Long Term: Exercising regularly at least 3-5 days a week.       Increase Strength and Stamina Yes       Intervention Provide advice, education, support and counseling about physical activity/exercise needs.;Develop an individualized exercise prescription for aerobic  and resistive training based on initial evaluation findings, risk stratification, comorbidities and participant's personal goals.       Expected Outcomes Short Term: Increase workloads from initial exercise prescription for resistance, speed, and METs.;Short Term:  Perform resistance training exercises routinely during rehab and add in resistance training at home;Long Term: Improve cardiorespiratory fitness, muscular endurance and strength as measured by increased METs and functional capacity (6MWT)       Able to understand and use rate of perceived exertion (RPE) scale Yes       Intervention Provide education and explanation on how to use RPE scale       Expected Outcomes Short Term: Able to use RPE daily in rehab to express subjective intensity level;Long Term:  Able to use RPE to guide intensity level when exercising independently       Knowledge and understanding of Target Heart Rate Range (THRR) Yes       Intervention Provide education and explanation of THRR including how the numbers were predicted and where they are located for reference       Expected Outcomes Short Term: Able to state/look up THRR;Short Term: Able to use daily as guideline for intensity in rehab;Long Term: Able to use THRR to govern intensity when exercising independently       Understanding of Exercise Prescription Yes       Intervention Provide education, explanation, and written materials on patient's individual exercise prescription       Expected Outcomes Short Term: Able to explain program exercise prescription;Long Term: Able to explain home exercise prescription to exercise independently                Exercise Goals Re-Evaluation :   Discharge Exercise Prescription (Final Exercise Prescription Changes):   Nutrition:  Target Goals: Understanding of nutrition guidelines, daily intake of sodium '1500mg'$ , cholesterol '200mg'$ , calories 30% from fat and 7% or less from saturated fats, daily to have 5 or more  servings of fruits and vegetables.  Biometrics:  Pre Biometrics - 08/03/22 0940       Pre Biometrics   Waist Circumference 40.75 inches    Hip Circumference 40.75 inches    Waist to Hip Ratio 1 %    Triceps Skinfold 12 mm    % Body Fat 25.6 %    Grip Strength 30 kg    Flexibility 0 in   Pt could not reach   Single Leg Stand 12.87 seconds              Nutrition Therapy Plan and Nutrition Goals:   Nutrition Assessments:  MEDIFICTS Score Key: ?70 Need to make dietary changes  40-70 Heart Healthy Diet ? 40 Therapeutic Level Cholesterol Diet    Picture Your Plate Scores: <34 Unhealthy dietary pattern with much room for improvement. 41-50 Dietary pattern unlikely to meet recommendations for good health and room for improvement. 51-60 More healthful dietary pattern, with some room for improvement.  >60 Healthy dietary pattern, although there may be some specific behaviors that could be improved.    Nutrition Goals Re-Evaluation:   Nutrition Goals Re-Evaluation:   Nutrition Goals Discharge (Final Nutrition Goals Re-Evaluation):   Psychosocial: Target Goals: Acknowledge presence or absence of significant depression and/or stress, maximize coping skills, provide positive support system. Participant is able to verbalize types and ability to use techniques and skills needed for reducing stress and depression.  Initial Review & Psychosocial Screening:  Initial Psych Review & Screening - 08/03/22 1456       Initial Review   Current issues with Current Stress Concerns    Source of Stress Concerns Family;Unable to perform yard/household activities;Unable to participate in former interests or hobbies;Chronic Illness    Comments Jeffery Kirby says he  has been experiencing shortness of breath and chest tightness which limits his activities      [place]? Yes   Jeffery Kirby has his wife for support recently [place] from [place] has neighbors but no close  friends     Barriers   Psychosocial barriers to participate in program There are no identifiable barriers or psychosocial needs.      Screening Interventions   Interventions Encouraged to exercise    Expected Outcomes Long Term Goal: Stressors or current issues are controlled or eliminated.;Short Term goal: Identification and review with participant of any Quality of Life or Depression concerns found by scoring the questionnaire.;Long Term goal: The participant improves quality of Life and PHQ9 Scores as seen by post scores and/or verbalization of changes             Quality of Life Scores:  Quality of Life - 08/03/22 1302       Quality of Life   Select Quality of Life      Quality of Life Scores   Health/Function Pre 16.33 %    Socioeconomic Pre 24.33 %    Psych/Spiritual Pre 20 %    Family Pre 17.25 %    GLOBAL Pre 18.75 %            Scores of 19 and below usually indicate a poorer quality of life in these areas.  A difference of  2-3 points is a clinically meaningful difference.  A difference of 2-3 points in the total score of the Quality of Life Index has been associated with significant improvement in overall quality of life, self-image, physical symptoms, and general health in studies assessing change in quality of life.  PHQ-9: Review Flowsheet       08/03/2022  Depression screen PHQ 2/9  Decreased Interest 0  Down, Depressed, Hopeless 0  PHQ - 2 Score 0  Altered sleeping 0  Tired, decreased energy 1  Change in appetite 1  Feeling bad or failure about yourself  0  Trouble concentrating 0  Moving slowly or fidgety/restless 0  Suicidal thoughts 0  PHQ-9 Score 2  Difficult doing work/chores Not difficult at all   Interpretation of Total Score  Total Score Depression Severity:  1-4 = Minimal depression, 5-9 = Mild depression, 10-14 = Moderate depression, 15-19 = Moderately severe depression, 20-27 = Severe depression   Psychosocial Evaluation and  Intervention:   Psychosocial Re-Evaluation:   Psychosocial Discharge (Final Psychosocial Re-Evaluation):   Vocational Rehabilitation: Provide vocational rehab assistance to qualifying candidates.   Vocational Rehab Evaluation & Intervention:  Vocational Rehab - 08/03/22 1459       Initial Vocational Rehab Evaluation & Intervention   Assessment shows need for Vocational Rehabilitation No   Jeffery Kirby is retired and does not need vocational rehab at this time            Education: Education Goals: Education classes will be provided on a weekly basis, covering required topics. Participant will state understanding/return demonstration of topics presented.     Core Videos: Exercise    Move It!  Clinical staff conducted group or individual video education with verbal and written material and guidebook.  Patient learns the recommended Pritikin exercise program. Exercise with the goal of living a long, healthy life. Some of the health benefits of exercise include controlled diabetes, healthier blood pressure levels, improved cholesterol levels, improved heart and lung capacity, improved sleep, and better body composition. Everyone should speak with their  doctor before starting or changing an exercise routine.  Biomechanical Limitations Clinical staff conducted group or individual video education with verbal and written material and guidebook.  Patient learns how biomechanical limitations can impact exercise and how we can mitigate and possibly overcome limitations to have an impactful and balanced exercise routine.  Body Composition Clinical staff conducted group or individual video education with verbal and written material and guidebook.  Patient learns that body composition (ratio of muscle mass to fat mass) is a key component to assessing overall fitness, rather than body weight alone. Increased fat mass, especially visceral belly fat, can put [place] at increased risk for metabolic  syndrome, type 2 diabetes, heart disease, and even death. It is recommended to combine diet and exercise (cardiovascular and resistance training) to improve your body composition. Seek guidance from your physician and exercise physiologist before implementing an exercise routine.  Exercise Action Plan Clinical staff conducted group or individual video education with verbal and written material and guidebook.  Patient learns the recommended strategies to achieve and enjoy long-term exercise adherence, including variety, self-motivation, self-efficacy, and positive decision making. Benefits of exercise include fitness, good health, weight management, more energy, better sleep, less stress, and overall well-being.  Medical   Heart Disease Risk Reduction Clinical staff conducted group or individual video education with verbal and written material and guidebook.  Patient learns our heart is our most vital organ as it circulates oxygen, nutrients, white blood cells, and hormones throughout the entire body, and carries waste away. Data supports a plant-based eating plan like the Pritikin Program for its effectiveness in slowing progression of and reversing heart disease. The video provides a number of recommendations to address heart disease.   Metabolic Syndrome and Belly Fat  Clinical staff conducted group or individual video education with verbal and written material and guidebook.  Patient learns what metabolic syndrome is, how it leads to heart disease, and how one can reverse it and keep it from coming back. You have metabolic syndrome if you have 3 of the following 5 criteria: abdominal obesity, high blood pressure, high triglycerides, low HDL cholesterol, and high blood sugar.  Hypertension and Heart Disease Clinical staff conducted group or individual video education with verbal and written material and guidebook.  Patient learns that high blood pressure, or hypertension, is very common in the  [place]. Hypertension is largely due to excessive salt intake, but other important risk factors include being overweight, physical inactivity, drinking too much alcohol, smoking, and not eating enough potassium from fruits and vegetables. High blood pressure is a leading risk factor for heart attack, stroke, congestive heart failure, dementia, kidney failure, and premature death. Long-term effects of excessive salt intake include stiffening of the arteries and thickening of heart muscle and organ damage. Recommendations include ways to reduce hypertension and the risk of heart disease.  Diseases of Our Time - Focusing on Diabetes Clinical staff conducted group or individual video education with verbal and written material and guidebook.  Patient learns why the best way to stop diseases of our time is prevention, through food and other lifestyle changes. Medicine (such as prescription pills and surgeries) is often only a Band-Aid on the problem, not a long-term solution. Most common diseases of our time include obesity, type 2 diabetes, hypertension, heart disease, and cancer. The Pritikin Program is recommended and has been proven to help reduce, reverse, and/or prevent the damaging effects of metabolic syndrome.  Nutrition   Overview of the [place]  Clinical staff conducted group or individual video education with verbal and written material and guidebook.  Patient learns about the [place] for disease risk reduction. The [place] emphasizes a wide variety of unrefined, minimally-processed carbohydrates, like fruits, vegetables, whole grains, and legumes. Go, Caution, and Stop food choices are explained. Plant-based and lean animal proteins are emphasized. Rationale provided for low sodium intake for blood pressure control, low added sugars for blood sugar stabilization, and low added fats and oils for coronary artery disease risk reduction and weight  management.  Calorie Density  Clinical staff conducted group or individual video education with verbal and written material and guidebook.  Patient learns about calorie density and how it impacts the Pritikin Eating Plan. Knowing the characteristics of the food you choose will help you decide whether those foods will lead to weight gain or weight loss, and whether you want to consume more or less of them. Weight loss is usually a side effect of the Pritikin Eating Plan because of its focus on low calorie-dense foods.  Label Reading  Clinical staff conducted group or individual video education with verbal and written material and guidebook.  Patient learns about the Pritikin recommended label reading guidelines and corresponding recommendations regarding calorie density, added sugars, sodium content, and whole grains.  Dining Out - Part 1  Clinical staff conducted group or individual video education with verbal and written material and guidebook.  Patient learns that restaurant meals can be sabotaging because they can be so high in calories, fat, sodium, and/or sugar. Patient learns recommended strategies on how to positively address this and avoid unhealthy pitfalls.  Facts on Fats  Clinical staff conducted group or individual video education with verbal and written material and guidebook.  Patient learns that lifestyle modifications can be just as effective, if not more so, as many medications for lowering your risk of heart disease. A Pritikin lifestyle can help to reduce your risk of inflammation and atherosclerosis (cholesterol build-up, or plaque, in the artery walls). Lifestyle interventions such as dietary choices and physical activity address the cause of atherosclerosis. A review of the types of fats and their impact on blood cholesterol levels, along with dietary recommendations to reduce fat intake is also included.  Nutrition Action Plan  Clinical staff conducted group or individual  video education with verbal and written material and guidebook.  Patient learns how to incorporate Pritikin recommendations into their lifestyle. Recommendations include planning and keeping personal health goals in mind as an important part of their success.  Healthy Mind-Set    Healthy Minds, Bodies, Hearts  Clinical staff conducted group or individual video education with verbal and written material and guidebook.  Patient learns how to identify when they are stressed. Video will discuss the impact of that stress, as well as the many benefits of stress management. Patient will also be introduced to stress management techniques. The way we think, act, and feel has an impact on our hearts.  How Our Thoughts Can Heal Our Hearts  Clinical staff conducted group or individual video education with verbal and written material and guidebook.  Patient learns that negative thoughts can cause depression and anxiety. This can result in negative lifestyle behavior and serious health problems. Cognitive behavioral therapy is an effective method to help control our thoughts in order to change and improve our emotional outlook.  Additional Videos:  Exercise    Improving Performance  Clinical staff conducted group or individual video education with verbal and written material  and guidebook.  Patient learns to use a non-linear approach by alternating intensity levels and lengths of time spent exercising to help burn more calories and lose more body fat. Cardiovascular exercise helps improve heart health, metabolism, hormonal balance, blood sugar control, and recovery from fatigue. Resistance training improves strength, endurance, balance, coordination, reaction time, metabolism, and muscle mass. Flexibility exercise improves circulation, posture, and balance. Seek guidance from your physician and exercise physiologist before implementing an exercise routine and learn your capabilities and proper form for all  exercise.  Introduction to Yoga  Clinical staff conducted group or individual video education with verbal and written material and guidebook.  Patient learns about yoga, a discipline of the coming together of mind, breath, and body. The benefits of yoga include improved flexibility, improved range of motion, better posture and core strength, increased lung function, weight loss, and positive self-image. Yoga's heart health benefits include lowered blood pressure, healthier heart rate, decreased cholesterol and triglyceride levels, improved immune function, and reduced stress. Seek guidance from your physician and exercise physiologist before implementing an exercise routine and learn your capabilities and proper form for all exercise.  Medical   Aging: Enhancing Your Quality of Life  Clinical staff conducted group or individual video education with verbal and written material and guidebook.  Patient learns key strategies and recommendations to stay in good physical health and enhance quality of life, such as prevention strategies, having an advocate, securing a [place], and keeping a list of medications and system for tracking them. It also discusses how to avoid risk for bone loss.  Biology of Weight Control  Clinical staff conducted group or individual video education with verbal and written material and guidebook.  Patient learns that weight gain occurs because we consume more calories than we burn (eating more, moving less). Even if your body weight is normal, you may have higher ratios of fat compared to muscle mass. Too much body fat puts you at increased risk for cardiovascular disease, heart attack, stroke, type 2 diabetes, and obesity-related cancers. In addition to exercise, following the [place] can help reduce your risk.  Decoding Lab Results  Clinical staff conducted group or individual video education with verbal and written material and  guidebook.  Patient learns that lab test reflects one measurement whose values change over time and are influenced by many factors, including medication, stress, sleep, exercise, food, hydration, pre-existing medical conditions, and more. It is recommended to use the knowledge from this video to become more involved with your lab results and evaluate your numbers to speak with your doctor.   Diseases of Our Time - Overview  Clinical staff conducted group or individual video education with verbal and written material and guidebook.  Patient learns that according to the CDC, 50% to 70% of chronic diseases (such as obesity, type 2 diabetes, elevated lipids, hypertension, and heart disease) are avoidable through lifestyle improvements including healthier food choices, listening to satiety cues, and increased physical activity.  Sleep Disorders Clinical staff conducted group or individual video education with verbal and written material and guidebook.  Patient learns how good quality and duration of sleep are important to overall health and well-being. Patient also learns about sleep disorders and how they impact health along with recommendations to address them, including discussing with a physician.  Nutrition  Dining Out - Part 2 Clinical staff conducted group or individual video education with verbal and written material and guidebook.  Patient learns how  to plan ahead and communicate in order to maximize their dining experience in a healthy and nutritious manner. Included are recommended food choices based on the type of restaurant the patient is visiting.   Fueling a Best boy conducted group or individual video education with verbal and written material and guidebook.  There is a strong connection between our food choices and our health. Diseases like obesity and type 2 diabetes are very prevalent and are in large-part due to lifestyle choices. The Pritikin Eating Plan  provides plenty of food and hunger-curbing satisfaction. It is easy to follow, affordable, and helps reduce health risks.  Menu Workshop  Clinical staff conducted group or individual video education with verbal and written material and guidebook.  Patient learns that restaurant meals can sabotage health goals because they are often packed with calories, fat, sodium, and sugar. Recommendations include strategies to plan ahead and to communicate with the manager, chef, or server to help order a healthier meal.  Planning Your Eating Strategy  Clinical staff conducted group or individual video education with verbal and written material and guidebook.  Patient learns about the [place] and its benefit of reducing the risk of disease. The [place] does not focus on calories. Instead, it emphasizes high-quality, nutrient-rich foods. By knowing the characteristics of the foods, we choose, we can determine their calorie density and make informed decisions.  Targeting Your Nutrition Priorities  Clinical staff conducted group or individual video education with verbal and written material and guidebook.  Patient learns that lifestyle habits have a tremendous impact on disease risk and progression. This video provides eating and physical activity recommendations based on your personal health goals, such as reducing LDL cholesterol, losing weight, preventing or controlling type 2 diabetes, and reducing high blood pressure.  Vitamins and Minerals  Clinical staff conducted group or individual video education with verbal and written material and guidebook.  Patient learns different ways to obtain key vitamins and minerals, including through a recommended healthy diet. It is important to discuss all supplements you take with your doctor.   Healthy Mind-Set    Smoking Cessation  Clinical staff conducted group or individual video education with verbal and written material and guidebook.   Patient learns that cigarette smoking and tobacco addiction pose a serious health risk which affects millions of people. Stopping smoking will significantly reduce the risk of heart disease, lung disease, and many forms of cancer. Recommended strategies for quitting are covered, including working with your doctor to develop a successful plan.  Culinary   Becoming a Financial trader conducted group or individual video education with verbal and written material and guidebook.  Patient learns that cooking at home can be healthy, cost-effective, quick, and puts them in control. Keys to cooking healthy recipes will include looking at your recipe, assessing your equipment needs, planning ahead, making it simple, choosing cost-effective seasonal ingredients, and limiting the use of added fats, salts, and sugars.  Cooking - Breakfast and Snacks  Clinical staff conducted group or individual video education with verbal and written material and guidebook.  Patient learns how important breakfast is to satiety and nutrition through the entire day. Recommendations include key foods to eat during breakfast to help stabilize blood sugar levels and to prevent overeating at meals later in the day. Planning ahead is also a key component.  Cooking - Human resources officer conducted group or individual video education with verbal and written material  and guidebook.  Patient learns eating strategies to improve overall health, including an approach to cook more at home. Recommendations include thinking of animal protein as a side on your plate rather than center stage and focusing instead on lower calorie dense options like vegetables, fruits, whole grains, and plant-based proteins, such as beans. Making sauces in large quantities to freeze for later and leaving the skin on your vegetables are also recommended to maximize your experience.  Cooking - Healthy Salads and Dressing Clinical staff  conducted group or individual video education with verbal and written material and guidebook.  Patient learns that vegetables, fruits, whole grains, and legumes are the foundations of the [place]. Recommendations include how to incorporate each of these in flavorful and healthy salads, and how to create homemade salad dressings. Proper handling of ingredients is also covered. Cooking - Soups and [employer] - Soups and Desserts Clinical staff conducted group or individual video education with verbal and written material and guidebook.  Patient learns that Pritikin soups and desserts make for easy, nutritious, and delicious snacks and meal components that are low in sodium, fat, sugar, and calorie density, while high in vitamins, minerals, and filling fiber. Recommendations include simple and healthy ideas for soups and desserts.   Overview     The Pritikin Solution Program Overview Clinical staff conducted group or individual video education with verbal and written material and guidebook.  Patient learns that the results of the [place] Program have been documented in more than 100 articles published in peer-reviewed journals, and the benefits include reducing risk factors for (and, in some cases, even reversing) high cholesterol, high blood pressure, type 2 diabetes, obesity, and more! An overview of the three key pillars of the Pritikin Program will be covered: eating well, doing regular exercise, and having a healthy mind-set.  WORKSHOPS  Exercise: Exercise Basics: Building Your Action Plan Clinical staff led group instruction and group discussion with PowerPoint presentation and patient guidebook. To enhance the learning environment the use of posters, models and videos may be added. At the conclusion of this workshop, patients will comprehend the difference between physical activity and exercise, as well as the benefits of incorporating both, into their routine. Patients will  understand the FITT (Frequency, Intensity, Time, and Type) principle and how to use it to build an exercise action plan. In addition, safety concerns and other considerations for exercise and cardiac rehab will be addressed by the presenter. The purpose of this lesson is to promote a comprehensive and effective weekly exercise routine in order to improve patients' overall level of fitness.   Managing Heart Disease: Your Path to a Healthier Heart Clinical staff led group instruction and group discussion with PowerPoint presentation and patient guidebook. To enhance the learning environment the use of posters, models and videos may be added.At the conclusion of this workshop, patients will understand the anatomy and physiology of the heart. Additionally, they will understand how Pritikin's three pillars impact the risk factors, the progression, and the management of heart disease.  The purpose of this lesson is to provide a high-level overview of the heart, heart disease, and how the Pritikin lifestyle positively impacts risk factors.  Exercise Biomechanics Clinical staff led group instruction and group discussion with PowerPoint presentation and patient guidebook. To enhance the learning environment the use of posters, models and videos may be added. Patients will learn how the structural parts of their bodies function and how these functions impact their daily activities, movement, and  exercise. Patients will learn how to promote a neutral spine, learn how to manage pain, and identify ways to improve their physical movement in order to promote healthy living. The purpose of this lesson is to expose patients to common physical limitations that impact physical activity. Participants will learn practical ways to adapt and manage aches and pains, and to minimize their effect on regular exercise. Patients will learn how to maintain good posture while sitting, walking, and lifting.  Balance Training  and Fall Prevention  Clinical staff led group instruction and group discussion with PowerPoint presentation and patient guidebook. To enhance the learning environment the use of posters, models and videos may be added. At the conclusion of this workshop, patients will understand the importance of their sensorimotor skills (vision, proprioception, and the vestibular system) in maintaining their ability to balance as they age. Patients will apply a variety of balancing exercises that are appropriate for their current level of function. Patients will understand the common causes for poor balance, possible solutions to these problems, and ways to modify their physical environment in order to minimize their fall risk. The purpose of this lesson is to teach patients about the importance of maintaining balance as they age and ways to minimize their risk of falling.  WORKSHOPS   Nutrition:  Fueling a Scientist, research (physical sciences) led group instruction and group discussion with PowerPoint presentation and patient guidebook. To enhance the learning environment the use of posters, models and videos may be added. Patients will review the foundational principles of the [place] and understand what constitutes a serving size in each of the food groups. Patients will also learn Pritikin-friendly foods that are better choices when away from home and review make-ahead meal and snack options. Calorie density will be reviewed and applied to three nutrition priorities: weight maintenance, weight loss, and weight gain. The purpose of this lesson is to reinforce (in a group setting) the key concepts around what patients are recommended to eat and how to apply these guidelines when away from home by planning and selecting Pritikin-friendly options. Patients will understand how calorie density may be adjusted for different weight management goals.  Mindful Eating  Clinical staff led group instruction and group  discussion with PowerPoint presentation and patient guidebook. To enhance the learning environment the use of posters, models and videos may be added. Patients will briefly review the concepts of the [place] and the importance of low-calorie dense foods. The concept of mindful eating will be introduced as well as the importance of paying attention to internal hunger signals. Triggers for non-hunger eating and techniques for dealing with triggers will be explored. The purpose of this lesson is to provide patients with the opportunity to review the basic principles of the [place], discuss the value of eating mindfully and how to measure internal cues of hunger and fullness using the Hunger Scale. Patients will also discuss reasons for non-hunger eating and learn strategies to use for controlling emotional eating.  Targeting Your Nutrition Priorities Clinical staff led group instruction and group discussion with PowerPoint presentation and patient guidebook. To enhance the learning environment the use of posters, models and videos may be added. Patients will learn how to determine their genetic susceptibility to disease by reviewing their family history. Patients will gain insight into the importance of diet as part of an overall healthy lifestyle in mitigating the impact of genetics and other environmental insults. The purpose of this lesson is to provide patients  with the opportunity to assess their personal nutrition priorities by looking at their family history, their own health history and current risk factors. Patients will also be able to discuss ways of prioritizing and modifying the [place] for their highest risk areas  Menu  Clinical staff led group instruction and group discussion with PowerPoint presentation and patient guidebook. To enhance the learning environment the use of posters, models and videos may be added. Using menus brought in from [employer],  or printed from [employer], patients will apply the [place] dining out guidelines that were presented in the [employer] video. Patients will also be able to practice these guidelines in a variety of provided scenarios. The purpose of this lesson is to provide patients with the opportunity to practice hands-on learning of the [place] with actual menus and practice scenarios.  Label Reading Clinical staff led group instruction and group discussion with PowerPoint presentation and patient guidebook. To enhance the learning environment the use of posters, models and videos may be added. Patients will review and discuss the Pritikin label reading guidelines presented in Pritikin's Label Reading Educational series video. Using fool labels brought in from local grocery stores and markets, patients will apply the label reading guidelines and determine if the packaged food meet the Pritikin guidelines. The purpose of this lesson is to provide patients with the opportunity to review, discuss, and practice hands-on learning of the Pritikin Label Reading guidelines with actual packaged food labels. [place] Workshops are designed to teach patients ways to prepare quick, simple, and affordable recipes at home. The importance of nutrition's role in chronic disease risk reduction is reflected in its emphasis in the overall Pritikin program. By learning how to prepare essential core Pritikin Eating Plan recipes, patients will increase control over what they eat; be able to customize the flavor of foods without the use of added salt, sugar, or fat; and improve the quality of the food they consume. By learning a set of core recipes which are easily assembled, quickly prepared, and affordable, patients are more likely to prepare more healthy foods at home. These workshops focus on convenient breakfasts, simple entres, side dishes, and desserts which  can be prepared with minimal effort and are consistent with nutrition recommendations for cardiovascular risk reduction. Cooking [employer] are taught by a Engineer, materials (RD) who has been trained by the [employer]. The chef or RD has a clear understanding of the importance of minimizing - if not completely eliminating - added fat, sugar, and sodium in recipes. Throughout the series of [place] Workshop sessions, patients will learn about healthy ingredients and efficient methods of cooking to build confidence in their capability to prepare    Cooking School weekly topics:  Adding Flavor- Sodium-Free  Fast and Healthy Breakfasts  Powerhouse Plant-Based Proteins  Satisfying Salads and Dressings  Simple Sides and Sauces  International Cuisine-Spotlight on the [place] Zones  Delicious Desserts  Savory Soups  Efficiency Cooking - Meals in a Snap  Tasty Appetizers and Snacks  Comforting Weekend Breakfasts  One-Pot Wonders   Fast Evening Meals  Easy [place] (Psychosocial): New Thoughts, New Behaviors Clinical staff led group instruction and group discussion with PowerPoint presentation and patient guidebook. To enhance the learning environment the use of posters, models and videos may be added. Patients will learn and practice techniques for developing  effective health and lifestyle goals. Patients will be able to effectively apply the goal setting process learned to develop at least one new personal goal.  The purpose of this lesson is to expose patients to a new skill set of behavior modification techniques such as techniques setting SMART goals, overcoming barriers, and achieving new thoughts and new behaviors.  Managing Moods and Relationships Clinical staff led group instruction and group discussion with PowerPoint presentation and patient guidebook. To enhance the learning  environment the use of posters, models and videos may be added. Patients will learn how emotional and chronic stress factors can impact their health and relationships. They will learn healthy ways to manage their moods and utilize positive coping mechanisms. In addition, ICR patients will learn ways to improve communication skills. The purpose of this lesson is to expose patients to ways of understanding how one's mood and health are intimately connected. Developing a healthy outlook can help build positive relationships and connections with others. Patients will understand the importance of utilizing effective communication skills that include actively listening and being heard. They will learn and understand the importance of the "4 Cs" and especially Connections in fostering of a Healthy Mind-Set.  Healthy Sleep for a Healthy Heart Clinical staff led group instruction and group discussion with PowerPoint presentation and patient guidebook. To enhance the learning environment the use of posters, models and videos may be added. At the conclusion of this workshop, patients will be able to demonstrate knowledge of the importance of sleep to overall health, well-being, and quality of life. They will understand the symptoms of, and treatments for, common sleep disorders. Patients will also be able to identify daytime and nighttime behaviors which impact sleep, and they will be able to apply these tools to help manage sleep-related challenges. The purpose of this lesson is to provide patients with a general overview of sleep and outline the importance of quality sleep. Patients will learn about a few of the most common sleep disorders. Patients will also be introduced to the concept of "sleep hygiene," and discover ways to self-manage certain sleeping problems through simple daily behavior changes. Finally, the workshop will motivate patients by clarifying the links between quality sleep and their goals of  heart-healthy living.   Recognizing and Reducing Stress Clinical staff led group instruction and group discussion with PowerPoint presentation and patient guidebook. To enhance the learning environment the use of posters, models and videos may be added. At the conclusion of this workshop, patients will be able to understand the types of stress reactions, differentiate between acute and chronic stress, and recognize the impact that chronic stress has on their health. They will also be able to apply different coping mechanisms, such as reframing negative self-talk. Patients will have the opportunity to practice a variety of stress management techniques, such as deep abdominal breathing, progressive muscle relaxation, and/or guided imagery.  The purpose of this lesson is to educate patients on the role of stress in their lives and to provide healthy techniques for coping with it.  Learning Barriers/Preferences:  Learning Barriers/Preferences - 08/03/22 1303       Learning Barriers/Preferences   Learning Barriers Sight   wears glasses   Learning Preferences Written Material;Computer/Internet;Pictoral;Video             Education Topics:  Knowledge Questionnaire Score:  Knowledge Questionnaire Score - 08/03/22 1302       Knowledge Questionnaire Score   Pre Score 17/24  Core Components/Risk Factors/Patient Goals at Admission:  Personal Goals and Risk Factors at Admission - 08/03/22 1302       Core Components/Risk Factors/Patient Goals on Admission    Weight Management Yes    Intervention Weight Management: Develop a combined nutrition and exercise program designed to reach desired caloric intake, while maintaining appropriate intake of nutrient and fiber, sodium and fats, and appropriate energy expenditure required for the weight goal.;Weight Management: Provide education and appropriate resources to help participant work on and attain dietary goals.    Expected Outcomes  Short Term: Continue to assess and modify interventions until short term weight is achieved;Long Term: Adherence to nutrition and physical activity/exercise program aimed toward attainment of established weight goal;Understanding recommendations for meals to include 15-35% energy as protein, 25-35% energy from fat, 35-60% energy from carbohydrates, less than '200mg'$  of dietary cholesterol, 20-35 gm of total fiber daily    Hypertension Yes    Intervention Provide education on lifestyle modifcations including regular physical activity/exercise, weight management, moderate sodium restriction and increased consumption of fresh fruit, vegetables, and low fat dairy, alcohol moderation, and smoking cessation.;Monitor prescription use compliance.    Expected Outcomes Short Term: Continued assessment and intervention until BP is < 140/67m HG in hypertensive participants. < 130/816mHG in hypertensive participants with diabetes, heart failure or chronic kidney disease.;Long Term: Maintenance of blood pressure at goal levels.    Lipids Yes    Intervention Provide education and support for participant on nutrition & aerobic/resistive exercise along with prescribed medications to achieve LDL '70mg'$ , HDL >'40mg'$ .    Expected Outcomes Short Term: Participant states understanding of desired cholesterol values and is compliant with medications prescribed. Participant is following exercise prescription and nutrition guidelines.;Long Term: Cholesterol controlled with medications as prescribed, with individualized exercise RX and with personalized nutrition plan. Value goals: LDL < '70mg'$ , HDL > 40 mg.    Stress Yes    Intervention Offer individual and/or small group education and counseling on adjustment to heart disease, stress management and health-related lifestyle change. Teach and support self-help strategies.;Refer participants experiencing significant psychosocial distress to appropriate mental health specialists for further  evaluation and treatment. When possible, include family members and significant others in education/counseling sessions.    Expected Outcomes Short Term: Participant demonstrates changes in health-related behavior, relaxation and other stress management skills, ability to obtain effective social support, and compliance with psychotropic medications if prescribed.;Long Term: Emotional wellbeing is indicated by absence of clinically significant psychosocial distress or social isolation.             Core Components/Risk Factors/Patient Goals Review:    Core Components/Risk Factors/Patient Goals at Discharge (Final Review):    ITP Comments:  ITP Comments     Row Name 08/03/22 1016           ITP Comments TrFransico HimMD, Medical Director.  Introduction to the Pritikin Education Program/Intensive Cardiac Rehab. Initial packet reviewed with the patient.                Comments: Participant attended orientation for the cardiac rehabilitation program on  08/03/2022  to perform initial intake and exercise walk test. Patient introduced to the [facility] and orientation packet was reviewed. Completed 6-minute walk test, measurements, initial ITP, and exercise prescription. Vital signs stable. Telemetry-normal sinus rhythm, first degree heart block, occasional PVC asymptomatic. Please see previous documentation regarding reports of chest tightness on admission.StRichardson Landryenied having any chest pain or tightness during his 6 minute walk [place]  RN BSN   Service time was from 0920 to 1135.

## 2022-08-03 NOTE — Telephone Encounter (Signed)
-----  Message from Werner Lean, MD sent at 08/03/2022  1:42 PM EST ----- Sounds good. EKG is unchanged. 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  Goal is for him to do cardiac rehab to improved chest tightness.  Thanks, MAC  ----- Message ----- From: Magda Kiel, RN Sent: 08/03/2022  10:26 AM EST To: Werner Lean, MD  Dr Monte Fantasia,  Mr Hott reported chest tightness now resolved. Please see attached 12 lead. Dr Einar Gip reviewed and said okay to proceed with 6 minute walk test.

## 2022-08-03 NOTE — Progress Notes (Signed)
Patient here for cardiac rehab orientation reporting having chest pressure rates a 2/10 says it feels like a softball. Blood pressure 110/70 heart rate 79. Telemetry rhythm Sinus. Chest tightness resolved with rest. Discussed with Dr Einar Gip on site provider said to obtain 12 lead ECG. Dr Einar Gip reviewed 12 lead ECG and said that Mr Jeffery Kirby is okay to proceed with orientation. Dr Einar Gip said that if Mr Fults continues to have chest discomfort he will need to follow up with Dr Gasper Sells. Will forward today's 12 lead to Dr Gasper Sells for review also.Barnet Pall, RN,BSN 08/03/2022 10:22 AM

## 2022-08-03 NOTE — Progress Notes (Signed)
Cardiac Rehab Medication Review by a Nurse  Does the patient  feel that his/her medications are working for him/her?   NO  Has the patient been experiencing any side effects to the medications prescribed?  NO  Does the patient measure his/her own blood pressure or blood glucose at home?  YES  Does the patient have any problems obtaining medications due to transportation or finances?    NO  Understanding of regimen: excellent Understanding of indications: excellent Potential of compliance: good    Nurse comments: Jeffery Kirby takes his medications as prescribed and has a good understanding of what his medications are for.     Jeffery Gave RN 08/03/2022 12:56 PM

## 2022-08-09 ENCOUNTER — Encounter (HOSPITAL_COMMUNITY)
Admission: RE | Admit: 2022-08-09 | Discharge: 2022-08-09 | Disposition: A | Discharge status: Home / Self Care | Payer: Medicare Other | Source: Ambulatory Visit | Attending: Internal Medicine | Admitting: Internal Medicine

## 2022-08-09 ENCOUNTER — Ambulatory Visit: Payer: Medicare Other | Admitting: Neurology

## 2022-08-09 DIAGNOSIS — Z955 Presence of coronary angioplasty implant and graft: Secondary | ICD-10-CM

## 2022-08-09 DIAGNOSIS — R079 Chest pain, unspecified: Secondary | ICD-10-CM

## 2022-08-09 DIAGNOSIS — Z48812 Encounter for surgical aftercare following surgery on the circulatory system: Secondary | ICD-10-CM | POA: Diagnosis not present

## 2022-08-09 NOTE — Progress Notes (Signed)
Daily Session Note  Patient Details  Name: Jeffery Kirby MRN: [id number] Date of Birth: 01-06-1946 Referring Provider:   Flowsheet Row INTENSIVE CARDIAC REHAB ORIENT from 08/03/2022 in [facility] [place]  Referring Provider Meryle Ready, MD       Encounter Date: 08/09/2022  Check In:  Session Check In - 08/09/22 1242       Check-In   Supervising physician immediately available to respond to emergencies [facility] - Physician supervision    Physician(s) Diona Browner, NP    Location [facility]    Staff Present Lesly Rubenstein, MS, ACSM-CEP, CCRP, Exercise Physiologist;Janith Nielson, RN, BSN;Jetta Walker BS, ACSM-CEP, Exercise Physiologist;Johnny Starleen Blue, MS, Exercise Physiologist    Virtual Visit No    Medication changes reported     No    Tobacco Cessation No Change    Warm-up and Cool-down Performed as group-led instruction   CRP2 orientation today   Resistance Training Performed No    VAD Patient? No    PAD/SET Patient? No      Pain Assessment   Currently in Pain? No/denies    Pain Score 0-No pain    Multiple Pain Sites No             Capillary Blood Glucose: No results found for this or any previous visit (from the past 24 hour(s)).   Exercise Prescription Changes - 08/09/22 1400       Response to Exercise   Blood Pressure (Admit) 108/64    Blood Pressure (Exercise) 128/72    Blood Pressure (Exit) 112/60    Heart Rate (Admit) 65 bpm    Heart Rate (Exercise) 96 bpm    Heart Rate (Exit) 69 bpm    Rating of Perceived Exertion (Exercise) 11    Symptoms None    Comments Pt's first day in the CRP2 program    Duration Continue with 30 min of aerobic exercise without signs/symptoms of physical distress.    Intensity THRR unchanged      Progression   Progression Continue to progress workloads to maintain intensity without signs/symptoms of physical distress.    Average METs 2.55      Resistance  Training   Training Prescription No    Weight No weights on Wednesdays      Interval Training   Interval Training No      Bike   Level 1    Watts 17    Minutes 15    METs 3.2      NuStep   Level 1    SPM 79    Minutes 15    METs 1.9             Social History   Tobacco Use  Smoking Status Never  Smokeless Tobacco Never    Goals Met:  Exercise tolerated well No report of concerns or symptoms today  Goals Unmet:  Not Applicable  Comments: Pt started cardiac rehab today.  Pt tolerated light exercise without difficulty. VSS, telemetry-Sinus rhythm, asymptomatic.  Medication list reconciled. Pt denies barriers to medicaiton compliance.  PSYCHOSOCIAL ASSESSMENT:  PHQ-2. Pt exhibits positive coping skills, hopeful outlook with supportive family. No psychosocial needs identified at this time, no psychosocial interventions necessary.    Pt enjoys reading, exercise, walking, pickelball and gardening.   Pt oriented to exercise equipment and routine.    Understanding verbalized.No reports of chest pain during exercise today. Harrell Gave RN BSN    Dr. Tressia Miners  Turner is Market researcher for Cardiac Rehab at [facility].

## 2022-08-11 ENCOUNTER — Encounter (HOSPITAL_COMMUNITY)
Admission: RE | Admit: 2022-08-11 | Discharge: 2022-08-11 | Disposition: A | Discharge status: Home / Self Care | Payer: Medicare Other | Source: Ambulatory Visit | Attending: Internal Medicine | Admitting: Internal Medicine

## 2022-08-11 DIAGNOSIS — Z955 Presence of coronary angioplasty implant and graft: Secondary | ICD-10-CM | POA: Diagnosis not present

## 2022-08-11 DIAGNOSIS — R079 Chest pain, unspecified: Secondary | ICD-10-CM

## 2022-08-11 DIAGNOSIS — Z48812 Encounter for surgical aftercare following surgery on the circulatory system: Secondary | ICD-10-CM | POA: Diagnosis not present

## 2022-08-14 ENCOUNTER — Encounter (HOSPITAL_COMMUNITY)
Admission: RE | Admit: 2022-08-14 | Discharge: 2022-08-14 | Disposition: A | Discharge status: Home / Self Care | Payer: Medicare Other | Source: Ambulatory Visit | Attending: Internal Medicine | Admitting: Internal Medicine

## 2022-08-14 DIAGNOSIS — Z955 Presence of coronary angioplasty implant and graft: Secondary | ICD-10-CM | POA: Diagnosis not present

## 2022-08-14 DIAGNOSIS — R079 Chest pain, unspecified: Secondary | ICD-10-CM | POA: Diagnosis not present

## 2022-08-14 DIAGNOSIS — Z48812 Encounter for surgical aftercare following surgery on the circulatory system: Secondary | ICD-10-CM | POA: Diagnosis not present

## 2022-08-16 ENCOUNTER — Encounter (HOSPITAL_COMMUNITY)
Admission: RE | Admit: 2022-08-16 | Discharge: 2022-08-16 | Disposition: A | Discharge status: Home / Self Care | Payer: Medicare Other | Source: Ambulatory Visit | Attending: Internal Medicine | Admitting: Internal Medicine

## 2022-08-16 DIAGNOSIS — Z955 Presence of coronary angioplasty implant and graft: Secondary | ICD-10-CM | POA: Diagnosis not present

## 2022-08-16 DIAGNOSIS — R079 Chest pain, unspecified: Secondary | ICD-10-CM | POA: Diagnosis not present

## 2022-08-16 DIAGNOSIS — Z48812 Encounter for surgical aftercare following surgery on the circulatory system: Secondary | ICD-10-CM | POA: Diagnosis not present

## 2022-08-16 NOTE — Progress Notes (Signed)
Intermittent nonsustained bigeminal PVC's noted. Patient asymptomatic. Blood pressure 122/68. ECG tracings shown to on site provider, Ambrose Pancoast NP. No new orders received. Spoke with the patient to let him know. Will continue to monitor the patient throughout  the [place] RN BSN

## 2022-08-18 ENCOUNTER — Encounter (HOSPITAL_COMMUNITY)
Admission: RE | Admit: 2022-08-18 | Discharge: 2022-08-18 | Disposition: A | Discharge status: Home / Self Care | Payer: Medicare Other | Source: Ambulatory Visit | Attending: Internal Medicine | Admitting: Internal Medicine

## 2022-08-18 DIAGNOSIS — Z955 Presence of coronary angioplasty implant and graft: Secondary | ICD-10-CM | POA: Diagnosis not present

## 2022-08-18 DIAGNOSIS — Z48812 Encounter for surgical aftercare following surgery on the circulatory system: Secondary | ICD-10-CM | POA: Diagnosis not present

## 2022-08-18 DIAGNOSIS — R079 Chest pain, unspecified: Secondary | ICD-10-CM | POA: Diagnosis not present

## 2022-08-18 NOTE — Progress Notes (Signed)
QUALITY OF LIFE SCORE REVIEW  Pt completed Quality of Life survey as a participant in Cardiac Rehab.  Scores 21.0 or below are considered low.  Pt score very low in several areas Overall 18.75, Health and Function 16.33, socioeconomic 24.33, physiological and spiritual 20.00, family 17.25. Patient quality of life slightly altered by physical constraints which limits ability to perform as prior to recent cardiac illness. Jeffery Kirby reports that since his he started exercise at cardiac rehab his chest pain  is getting better . Jeffery Kirby still has shortness of breath,, he reports he has had some improvement in his symptoms. Jeffery Kirby says he is concerned about his younger brother who also has cardiac issues. Offered emotional support and reassurance.  Will continue to monitor and intervene as necessary. Jeffery Kirby says he goes to [employer] 2 days a week in addition to the days he attends exercise at cardiac rehab. Jeffery Kirby denies being depressed currently. Will forward today's quality of life questionnaire to Dr Brigitte Pulse as requested.Barnet Pall, RN,BSN 08/18/2022 1:39 PM

## 2022-08-21 ENCOUNTER — Encounter (HOSPITAL_COMMUNITY)
Admission: RE | Admit: 2022-08-21 | Discharge: 2022-08-21 | Disposition: A | Discharge status: Home / Self Care | Payer: Medicare Other | Source: Ambulatory Visit | Attending: Internal Medicine | Admitting: Internal Medicine

## 2022-08-21 DIAGNOSIS — Z955 Presence of coronary angioplasty implant and graft: Secondary | ICD-10-CM | POA: Diagnosis not present

## 2022-08-21 DIAGNOSIS — Z48812 Encounter for surgical aftercare following surgery on the circulatory system: Secondary | ICD-10-CM | POA: Diagnosis not present

## 2022-08-21 DIAGNOSIS — R079 Chest pain, unspecified: Secondary | ICD-10-CM

## 2022-08-23 ENCOUNTER — Encounter (HOSPITAL_COMMUNITY)
Admission: RE | Admit: 2022-08-23 | Discharge: 2022-08-23 | Disposition: A | Discharge status: Home / Self Care | Payer: Medicare Other | Source: Ambulatory Visit | Attending: Internal Medicine | Admitting: Internal Medicine

## 2022-08-23 DIAGNOSIS — R079 Chest pain, unspecified: Secondary | ICD-10-CM | POA: Diagnosis not present

## 2022-08-23 DIAGNOSIS — Z48812 Encounter for surgical aftercare following surgery on the circulatory system: Secondary | ICD-10-CM | POA: Diagnosis not present

## 2022-08-23 DIAGNOSIS — Z955 Presence of coronary angioplasty implant and graft: Secondary | ICD-10-CM | POA: Diagnosis not present

## 2022-08-24 ENCOUNTER — Encounter: Payer: Self-pay | Admitting: Internal Medicine

## 2022-08-25 ENCOUNTER — Telehealth: Payer: Self-pay | Admitting: Internal Medicine

## 2022-08-25 ENCOUNTER — Encounter (HOSPITAL_COMMUNITY)
Admission: RE | Admit: 2022-08-25 | Discharge: 2022-08-25 | Disposition: A | Discharge status: Home / Self Care | Payer: Medicare Other | Source: Ambulatory Visit | Attending: Internal Medicine | Admitting: Internal Medicine

## 2022-08-25 DIAGNOSIS — R079 Chest pain, unspecified: Secondary | ICD-10-CM

## 2022-08-25 DIAGNOSIS — Z48812 Encounter for surgical aftercare following surgery on the circulatory system: Secondary | ICD-10-CM | POA: Diagnosis not present

## 2022-08-25 DIAGNOSIS — Z955 Presence of coronary angioplasty implant and graft: Secondary | ICD-10-CM | POA: Diagnosis not present

## 2022-08-25 NOTE — Telephone Encounter (Signed)
Patient returning call in regards to [employer].

## 2022-08-25 NOTE — Telephone Encounter (Signed)
Left voicemail to discuss Dr. Gasper Sells recommendation.

## 2022-08-25 NOTE — Telephone Encounter (Signed)
Spoke with patient to review Dr Oralia Rud recommendations regarding stopping Pantoprazole. Patient verbalized understanding and had no questions.

## 2022-08-28 ENCOUNTER — Encounter (HOSPITAL_COMMUNITY)
Admission: RE | Admit: 2022-08-28 | Discharge: 2022-08-28 | Disposition: A | Discharge status: Home / Self Care | Payer: Medicare Other | Source: Ambulatory Visit | Attending: Internal Medicine | Admitting: Internal Medicine

## 2022-08-28 DIAGNOSIS — R079 Chest pain, unspecified: Secondary | ICD-10-CM | POA: Diagnosis not present

## 2022-08-28 DIAGNOSIS — Z955 Presence of coronary angioplasty implant and graft: Secondary | ICD-10-CM | POA: Diagnosis not present

## 2022-08-28 DIAGNOSIS — Z48812 Encounter for surgical aftercare following surgery on the circulatory system: Secondary | ICD-10-CM | POA: Diagnosis not present

## 2022-08-29 NOTE — Progress Notes (Signed)
Cardiac Individual Treatment Plan  Patient Details  Name: Jeffery Kirby MRN: [id number] Date of Birth: Dec 18, 1945 Referring Provider:   Flowsheet Row INTENSIVE CARDIAC REHAB ORIENT from 08/03/2022 in [facility] for Heart, Vascular, & [place]  Referring Provider Meryle Ready, MD       Initial Encounter Date:  [place] from 08/03/2022 in [facility] for Heart, Vascular, & [place]  Date 08/03/22       Visit Diagnosis: 04/25/22 Coronary artey stent placement LAD  Chest pain, unspecified type  Patient's Home Medications on Admission:  Current Outpatient Medications:    alum hydroxide-mag trisilicate (GAVISCON) [id number] MG CHEW chewable tablet, Chew 2 tablets by mouth daily as needed for indigestion or heartburn., Disp: , Rfl:    aspirin EC 81 MG tablet, Take 1 tablet (81 mg total) by mouth daily. Swallow whole., Disp: 90 tablet, Rfl: 3   clopidogrel (PLAVIX) 75 MG tablet, Take 1 tablet (75 mg total) by mouth daily., Disp: 90 tablet, Rfl: 3   Cyanocobalamin (B-12) 1000 MCG SUBL, Place 1,000 mcg under the tongue 2 (two) times a week., Disp: , Rfl:    metoprolol tartrate (LOPRESSOR) 25 MG tablet, Take 0.5 tablets (12.5 mg total) by mouth 2 (two) times daily., Disp: 90 tablet, Rfl: 3   nitroGLYCERIN (NITROSTAT) 0.4 MG SL tablet, Place 1 tablet (0.4 mg total) under the tongue every 5 (five) minutes as needed for chest pain., Disp: 25 tablet, Rfl: 3   pantoprazole (PROTONIX) 40 MG tablet, Take 1 tablet (40 mg total) by mouth daily., Disp: 30 tablet, Rfl: 11   Polyethyl Glycol-Propyl Glycol (SYSTANE OP), Place 1 drop into both eyes daily as needed (dry eyes)., Disp: , Rfl:    rosuvastatin (CRESTOR) 40 MG tablet, Take 1 tablet (40 mg total) by mouth daily., Disp: 90 tablet, Rfl: 3  Past Medical History: Past Medical History:  Diagnosis Date   Angina pectoris ([place])    Cataract    Exertional chest  pain    Hyperlipidemia    Hypertension    Shingles 2012   Tinnitus     Tobacco Use: Social History   Tobacco Use  Smoking Status Never  Smokeless Tobacco Never    Labs: Review Flowsheet       Latest Ref Rng & Units 11/23/2020  Labs for ITP Cardiac and Pulmonary Rehab  Cholestrol 100 - 199 mg/dL 116   LDL (calc) 0 - 99 mg/dL 49   HDL-C >39 mg/dL 52   Trlycerides 0 - 149 mg/dL 73     Capillary Blood Glucose: No results found for: "GLUCAP"   Exercise Target Goals: Exercise Program Goal: Individual exercise prescription set using results from initial 6 min walk test and THRR while considering  patient's activity barriers and safety.   Exercise Prescription Goal: Initial exercise prescription builds to 30-45 minutes a day of aerobic activity, 2-3 days per week.  Home exercise guidelines will be given to patient during program as part of exercise prescription that the participant will acknowledge.  Activity Barriers & Risk Stratification:  Activity Barriers & Cardiac Risk Stratification - 08/03/22 1257       Activity Barriers & Cardiac Risk Stratification   Activity Barriers Back Problems;Deconditioning;Chest Pain/Angina;Balance Concerns;History of Falls;Joint Problems;Neck/Spine Problems    Cardiac Risk Stratification High             6 Minute Walk:  6 Minute Walk     Row Name 08/03/22 1256  6 Minute Walk   Phase Initial     Distance 1416 feet     Walk Time 6 minutes     # of Rest Breaks 0     MPH 2.7     METS 2.9     RPE 11     Perceived Dyspnea  0     VO2 Peak 10.2     Symptoms No     Resting HR 78 bpm     Resting BP 110/70     Resting Oxygen Saturation  97 %     Exercise Oxygen Saturation  during 6 min walk 99 %     Max Ex. HR 79 bpm     Max Ex. BP 118/70     2 Minute Post BP 112/71              Oxygen Initial Assessment:   Oxygen Re-Evaluation:   Oxygen Discharge (Final Oxygen Re-Evaluation):   Initial Exercise  Prescription:  Initial Exercise Prescription - 08/03/22 1300       Date of Initial Exercise RX and Referring Provider   Date 08/03/22    Referring Provider Meryle Ready, MD    Expected Discharge Date 09/29/22      Bike   Level 1    Watts 25    Minutes 15    METs 2.9      NuStep   Level 1    SPM 75    Minutes 15    METs 2.9      Prescription Details   Frequency (times per week) 3    Duration Progress to 30 minutes of continuous aerobic without signs/symptoms of physical distress      Intensity   THRR 40-80% of Max Heartrate 58-117    Ratings of Perceived Exertion 11-13    Perceived Dyspnea 0-4      Progression   Progression Continue progressive overload as per policy without signs/symptoms or physical distress.      Resistance Training   Training Prescription Yes    Weight 3 lbs    Reps 10-15             Perform Capillary Blood Glucose checks as needed.  Exercise Prescription Changes:   Exercise Prescription Changes     Row Name 08/09/22 1400 08/23/22 1400           Response to Exercise   Blood Pressure (Admit) 108/64 112/62      Blood Pressure (Exercise) 128/72 144/60      Blood Pressure (Exit) 112/60 122/70      Heart Rate (Admit) 65 bpm 65 bpm      Heart Rate (Exercise) 96 bpm 93 bpm      Heart Rate (Exit) 69 bpm 65 bpm      Rating of Perceived Exertion (Exercise) 11 10      Symptoms None None      Comments Pt's first day in the CRP2 program Reviewed METs      Duration Continue with 30 min of aerobic exercise without signs/symptoms of physical distress. Continue with 30 min of aerobic exercise without signs/symptoms of physical distress.      Intensity THRR unchanged THRR unchanged        Progression   Progression Continue to progress workloads to maintain intensity without signs/symptoms of physical distress. Continue to progress workloads to maintain intensity without signs/symptoms of physical distress.      Average METs 2.55 3.2  Resistance Training   Training Prescription No No      Weight No weights on Wednesdays No weights on Wednesdays        Interval Training   Interval Training No No        Bike   Level 1 2      Watts 17 --      Minutes 15 15      METs 3.2 4.1        NuStep   Level 1 2      SPM 79 97      Minutes 15 15      METs 1.9 2.3               Exercise Comments:   Exercise Comments     Row Name 08/09/22 1436 08/23/22 1439         Exercise Comments Pt's first day in the CRP2 program. Pt had no complaints with todays session. Reviewed METs. Pt increased workload on nustep. Pt is making good progress.               Exercise Goals and Review:   Exercise Goals     Row Name 08/03/22 1258             Exercise Goals   Increase Physical Activity Yes       Intervention Provide advice, education, support and counseling about physical activity/exercise needs.;Develop an individualized exercise prescription for aerobic and resistive training based on initial evaluation findings, risk stratification, comorbidities and participant's personal goals.       Expected Outcomes Short Term: Attend rehab on a regular basis to increase amount of physical activity.;Long Term: Add in home exercise to make exercise part of routine and to increase amount of physical activity.;Long Term: Exercising regularly at least 3-5 days a week.       Increase Strength and Stamina Yes       Intervention Provide advice, education, support and counseling about physical activity/exercise needs.;Develop an individualized exercise prescription for aerobic and resistive training based on initial evaluation findings, risk stratification, comorbidities and participant's personal goals.       Expected Outcomes Short Term: Increase workloads from initial exercise prescription for resistance, speed, and METs.;Short Term: Perform resistance training exercises routinely during rehab and add in resistance training at  home;Long Term: Improve cardiorespiratory fitness, muscular endurance and strength as measured by increased METs and functional capacity (6MWT)       Able to understand and use rate of perceived exertion (RPE) scale Yes       Intervention Provide education and explanation on how to use RPE scale       Expected Outcomes Short Term: Able to use RPE daily in rehab to express subjective intensity level;Long Term:  Able to use RPE to guide intensity level when exercising independently       Knowledge and understanding of Target Heart Rate Range (THRR) Yes       Intervention Provide education and explanation of THRR including how the numbers were predicted and where they are located for reference       Expected Outcomes Short Term: Able to state/look up THRR;Short Term: Able to use daily as guideline for intensity in rehab;Long Term: Able to use THRR to govern intensity when exercising independently       Understanding of Exercise Prescription Yes       Intervention Provide education, explanation, and written materials on patient's individual exercise prescription  Expected Outcomes Short Term: Able to explain program exercise prescription;Long Term: Able to explain home exercise prescription to exercise independently                Exercise Goals Re-Evaluation :  Exercise Goals Re-Evaluation     Row Name 08/09/22 1434             Exercise Goal Re-Evaluation   Exercise Goals Review Increase Physical Activity;Increase Strength and Stamina;Able to understand and use rate of perceived exertion (RPE) scale;Understanding of Exercise Prescription;Knowledge and understanding of Target Heart Rate Range (THRR)       Comments Pt's first day in the CRP2 program. Pt understands the exercise RX, THRR and RPE scale.       Expected Outcomes Will continue to monitor patient and progress exericse workloads as tolerated.                Discharge Exercise Prescription (Final Exercise Prescription  Changes):  Exercise Prescription Changes - 08/23/22 1400       Response to Exercise   Blood Pressure (Admit) 112/62    Blood Pressure (Exercise) 144/60    Blood Pressure (Exit) 122/70    Heart Rate (Admit) 65 bpm    Heart Rate (Exercise) 93 bpm    Heart Rate (Exit) 65 bpm    Rating of Perceived Exertion (Exercise) 10    Symptoms None    Comments Reviewed METs    Duration Continue with 30 min of aerobic exercise without signs/symptoms of physical distress.    Intensity THRR unchanged      Progression   Progression Continue to progress workloads to maintain intensity without signs/symptoms of physical distress.    Average METs 3.2      Resistance Training   Training Prescription No    Weight No weights on Wednesdays      Interval Training   Interval Training No      Bike   Level 2    Minutes 15    METs 4.1      NuStep   Level 2    SPM 97    Minutes 15    METs 2.3             Nutrition:  Target Goals: Understanding of nutrition guidelines, daily intake of sodium '1500mg'$ , cholesterol '200mg'$ , calories 30% from fat and 7% or less from saturated fats, daily to have 5 or more servings of fruits and vegetables.  Biometrics:  Pre Biometrics - 08/03/22 0940       Pre Biometrics   Waist Circumference 40.75 inches    Hip Circumference 40.75 inches    Waist to Hip Ratio 1 %    Triceps Skinfold 12 mm    % Body Fat 25.6 %    Grip Strength 30 kg    Flexibility 0 in   Pt could not reach   Single Leg Stand 12.87 seconds              Nutrition Therapy Plan and Nutrition Goals:  Nutrition Therapy & Goals - 08/10/22 0826       Nutrition Therapy   Diet Heart Healthy Diet    Drug/Food Interactions Statins/Certain Fruits      Personal Nutrition Goals   Nutrition Goal Patient to identify strategies for reducing cardiovascular risk by attending the weekly Pritikin education and nutrition series    Personal Goal #2 Patient to improve diet quality by using the plate  method as a daily guide for meal planning to include  lean protein/plant protein, fruits, vegetables, whole grains, nonfat dairy as part of well balanced diet    Personal Goal #3 Patient to reduce sodium intake to '1500mg'$  per day    Comments Jeffery Kirby has started making some dietary changes such as switching to olive oil. Answered question regarding supplementing olive oil, fat intake, etc. Jeffery Kirby will continue to benefit from participation in intensive cardiac rehab for nutrition, exercise, and lifestyle modification.      Intervention Plan   Intervention Prescribe, educate and counsel regarding individualized specific dietary modifications aiming towards targeted core components such as weight, hypertension, lipid management, diabetes, heart failure and other comorbidities.;Nutrition handout(s) given to patient.    Expected Outcomes Short Term Goal: Understand basic principles of dietary content, such as calories, fat, sodium, cholesterol and nutrients.;Long Term Goal: Adherence to prescribed nutrition plan.             Nutrition Assessments:  Nutrition Assessments - 08/10/22 0836       Rate Your Plate Scores   Pre Score 71            MEDIFICTS Score Key: ?70 Need to make dietary changes  40-70 Heart Healthy Diet ? 40 Therapeutic Level Cholesterol Diet   Flowsheet Row INTENSIVE CARDIAC REHAB from 08/09/2022 in [facility], Vascular, & [facility]  Picture Your Plate Total Score on Admission 71      Picture Your Plate Scores: [id number] Unhealthy dietary pattern with much room for improvement. 41-50 Dietary pattern unlikely to meet recommendations for good health and room for improvement. 51-60 More healthful dietary pattern, with some room for improvement.  >60 Healthy dietary pattern, although there may be some specific behaviors that could be improved.    Nutrition Goals Re-Evaluation:  Nutrition Goals Re-Evaluation     [place] Name 08/10/22 0826              Goals   Current Weight 189 lb 2.5 oz (85.8 kg)       Comment HDL 37, A1c WNL       Expected Outcome Jeffery Kirby has started making some dietary changes such as switching to olive oil. Answered question regarding supplementing olive oil, fat intake, etc. Jeffery Kirby will continue to benefit from participation in intensive cardiac rehab for nutrition, exercise, and lifestyle modification.                Nutrition Goals Re-Evaluation:  Nutrition Goals Re-Evaluation     [place] Name 08/10/22 0826             Goals   Current Weight 189 lb 2.5 oz (85.8 kg)       Comment HDL 37, A1c WNL       Expected Outcome Jeffery Kirby has started making some dietary changes such as switching to olive oil. Answered question regarding supplementing olive oil, fat intake, etc. Jeffery Kirby will continue to benefit from participation in intensive cardiac rehab for nutrition, exercise, and lifestyle modification.                Nutrition Goals Discharge (Final Nutrition Goals Re-Evaluation):  Nutrition Goals Re-Evaluation - 08/10/22 0826       Goals   Current Weight 189 lb 2.5 oz (85.8 kg)    Comment HDL 37, A1c WNL    Expected Outcome Jeffery Kirby has started making some dietary changes such as switching to olive oil. Answered question regarding supplementing olive oil, fat intake, etc. Jeffery Kirby will continue to benefit from participation in intensive cardiac rehab for  nutrition, exercise, and lifestyle modification.             Psychosocial: Target Goals: Acknowledge presence or absence of significant depression and/or stress, maximize coping skills, provide positive support system. Participant is able to verbalize types and ability to use techniques and skills needed for reducing stress and depression.  Initial Review & Psychosocial Screening:  Initial Psych Review & Screening - 08/03/22 1456       Initial Review   Current issues with Current Stress Concerns    Source of Stress Concerns  Family;Unable to perform yard/household activities;Unable to participate in former interests or hobbies;Chronic Illness    Comments Jeffery Kirby says he has been experiencing shortness of breath and chest tightness which limits his activities      [place]? Yes   Jeffery Kirby has his wife for support [place] has neighbors but no close friends     Barriers   Psychosocial barriers to participate in program There are no identifiable barriers or psychosocial needs.      Screening Interventions   Interventions Encouraged to exercise    Expected Outcomes Long Term Goal: Stressors or current issues are controlled or eliminated.;Short Term goal: Identification and review with participant of any Quality of Life or Depression concerns found by scoring the questionnaire.;Long Term goal: The participant improves quality of Life and PHQ9 Scores as seen by post scores and/or verbalization of changes             Quality of Life Scores:  Quality of Life - 08/03/22 1302       Quality of Life   Select Quality of Life      Quality of Life Scores   Health/Function Pre 16.33 %    Socioeconomic Pre 24.33 %    Psych/Spiritual Pre 20 %    Family Pre 17.25 %    GLOBAL Pre 18.75 %            Scores of 19 and below usually indicate a poorer quality of life in these areas.  A difference of  2-3 points is a clinically meaningful difference.  A difference of 2-3 points in the total score of the Quality of Life Index has been associated with significant improvement in overall quality of life, self-image, physical symptoms, and general health in studies assessing change in quality of life.  PHQ-9: Review Flowsheet       08/03/2022  Depression screen PHQ 2/9  Decreased Interest 0  Down, Depressed, Hopeless 0  PHQ - 2 Score 0  Altered sleeping 0  Tired, decreased energy 1  Change in appetite 1  Feeling bad or failure about yourself  0  Trouble concentrating 0   Moving slowly or fidgety/restless 0  Suicidal thoughts 0  PHQ-9 Score 2  Difficult doing work/chores Not difficult at all   Interpretation of Total Score  Total Score Depression Severity:  1-4 = Minimal depression, 5-9 = Mild depression, 10-14 = Moderate depression, 15-19 = Moderately severe depression, 20-27 = Severe depression   Psychosocial Evaluation and Intervention:   Psychosocial Re-Evaluation:  Psychosocial Re-Evaluation     Row Name 08/10/22 1509 08/29/22 [id number]           Psychosocial Re-Evaluation   Current issues with Current Stress Concerns Current Stress Concerns      Comments Jeffery Kirby did not voice any increased concerns or stressors on his first day of exercise. Will review quality of life survey in the upcoing  week Jeffery Kirby has not voiced any  increased concerns or stressors at intensive cardiac rehab. . Quality of life survey reviewed and forwarded to Dr Brigitte Pulse, primary care provider.      Expected Outcomes Jeffery Kirby will havw controlled or decreased stress upon completion of intensive cardiac rehab. Jeffery Kirby will have controlled or decreased stress upon completion of intensive cardiac rehab.      Interventions Stress management education;Encouraged to attend Cardiac Rehabilitation for the exercise;Relaxation education Stress management education;Encouraged to attend Cardiac Rehabilitation for the exercise;Relaxation education      Continue Psychosocial Services  Follow up required by staff Follow up required by staff        Initial Review   Source of Stress Concerns Chronic Illness;Unable to participate in former interests or hobbies;Unable to perform yard/household activities Chronic Illness;Unable to participate in former interests or hobbies;Unable to perform yard/household activities      Comments Will continue to monitor and offer support as needed Will continue to monitor and offer support as needed               Psychosocial Discharge (Final Psychosocial  Re-Evaluation):  Psychosocial Re-Evaluation - 08/29/22 0937       Psychosocial Re-Evaluation   Current issues with Current Stress Concerns    Comments Jeffery Kirby has not voiced any  increased concerns or stressors at intensive cardiac rehab. . Quality of life survey reviewed and forwarded to Dr Brigitte Pulse, primary care provider.    Expected Outcomes Jeffery Kirby will have controlled or decreased stress upon completion of intensive cardiac rehab.    Interventions Stress management education;Encouraged to attend Cardiac Rehabilitation for the exercise;Relaxation education    Continue Psychosocial Services  Follow up required by staff      Initial Review   Source of Stress Concerns Chronic Illness;Unable to participate in former interests or hobbies;Unable to perform yard/household activities    Comments Will continue to monitor and offer support as needed             Vocational Rehabilitation: Provide vocational rehab assistance to qualifying candidates.   Vocational Rehab Evaluation & Intervention:  Vocational Rehab - 08/03/22 1459       Initial Vocational Rehab Evaluation & Intervention   Assessment shows need for Vocational Rehabilitation No   Jeffery Kirby is retired and does not need vocational rehab at this time            Education: Education Goals: Education classes will be provided on a weekly basis, covering required topics. Participant will state understanding/return demonstration of topics presented.    Education     Row Name 08/09/22 1500     Education   Cardiac Education Topics [employer]   Educator Dietitian   Weekly Topic One-Pot Wonders   Instruction Review Code 1- Verbalizes Understanding   Class Start Time 1400   Class Stop Time 1450   Class Time Calculation (min) 50 min    [place] Name 08/11/22 1600     Education   Cardiac Education Topics Pritikin   Management consultant General Education   General Education Hypertension and Heart Disease   Instruction Review Code 1- Verbalizes Understanding   Class Start Time 1410   Class Stop Time 1450   Class Time Calculation (min) 40 min    [place] Name 08/14/22 1500     Education   Cardiac Education Topics  Pritikin   Environmental consultant Psychosocial   Psychosocial Workshop Healthy Sleep for a Healthy Heart   Instruction Review Code 1- Engineer, civil (consulting) Start Time 1400   Class Stop Time 1450   Class Time Calculation (min) 50 min    [place] Name 08/16/22 1500     Education   Cardiac Education Topics Pritikin   Financial trader   Weekly Topic Comforting Weekend Breakfasts   Instruction Review Code 1- Verbalizes Understanding   Class Start Time 1400   Class Stop Time 1445   Class Time Calculation (min) 45 min    [place] Name 08/18/22 1500     Education   Cardiac Education Topics Pritikin   Lexicographer Nutrition   Nutrition Dining Out - Part 1   Instruction Review Code 1- Verbalizes Understanding   Class Start Time 1405   Class Stop Time 1440   Class Time Calculation (min) 35 min    [place] Name 08/21/22 1500     Education   Cardiac Education Topics Pritikin   Academic librarian Exercise Education   Exercise Education Biomechanial Limitations   Instruction Review Code 1- Verbalizes Understanding   Class Start Time 1406   Class Stop Time 1442   Class Time Calculation (min) 36 min    [place] Name 08/23/22 1600     Education   Cardiac Education Topics Pritikin   Financial trader   Weekly Topic Fast Evening Meals   Instruction Review Code 1- Verbalizes Understanding   Class Start Time 1400   Class Stop Time 1445   Class Time  Calculation (min) 45 min    [place] Name 08/28/22 1500     Education   Cardiac Education Topics Pritikin   Financial trader   Weekly Topic International Cuisine- Spotlight on the [place]   Instruction Review Code 1- Verbalizes Understanding   Class Start Time 1405   Class Stop Time 1453   Class Time Calculation (min) 48 min            Core Videos: Exercise    Move It!  Clinical staff conducted group or individual video education with verbal and written material and guidebook.  Patient learns the recommended Pritikin exercise program. Exercise with the goal of living a long, healthy life. Some of the health benefits of exercise include controlled diabetes, healthier blood pressure levels, improved cholesterol levels, improved heart and lung capacity, improved sleep, and better body composition. Everyone should speak with their doctor before starting or changing an exercise routine.  Biomechanical Limitations Clinical staff conducted group or individual video education with verbal and written material and guidebook.  Patient learns how biomechanical limitations can impact exercise and how we can mitigate and possibly overcome limitations to have an impactful and balanced exercise routine.  Body Composition Clinical staff conducted group or individual video education with verbal and written material and guidebook.  Patient learns that body composition (ratio of muscle mass to fat mass) is a key component to assessing overall fitness, rather than body weight alone. Increased fat mass, especially visceral belly  fat, can put [place] at increased risk for metabolic syndrome, type 2 diabetes, heart disease, and even death. It is recommended to combine diet and exercise (cardiovascular and resistance training) to improve your body composition. Seek guidance from your physician and exercise physiologist before implementing an exercise  routine.  Exercise Action Plan Clinical staff conducted group or individual video education with verbal and written material and guidebook.  Patient learns the recommended strategies to achieve and enjoy long-term exercise adherence, including variety, self-motivation, self-efficacy, and positive decision making. Benefits of exercise include fitness, good health, weight management, more energy, better sleep, less stress, and overall well-being.  Medical   Heart Disease Risk Reduction Clinical staff conducted group or individual video education with verbal and written material and guidebook.  Patient learns our heart is our most vital organ as it circulates oxygen, nutrients, white blood cells, and hormones throughout the entire body, and carries waste away. Data supports a plant-based eating plan like the Pritikin Program for its effectiveness in slowing progression of and reversing heart disease. The video provides a number of recommendations to address heart disease.   Metabolic Syndrome and Belly Fat  Clinical staff conducted group or individual video education with verbal and written material and guidebook.  Patient learns what metabolic syndrome is, how it leads to heart disease, and how one can reverse it and keep it from coming back. You have metabolic syndrome if you have 3 of the following 5 criteria: abdominal obesity, high blood pressure, high triglycerides, low HDL cholesterol, and high blood sugar.  Hypertension and Heart Disease Clinical staff conducted group or individual video education with verbal and written material and guidebook.  Patient learns that high blood pressure, or hypertension, is very common in the [place]. Hypertension is largely due to excessive salt intake, but other important risk factors include being overweight, physical inactivity, drinking too much alcohol, smoking, and not eating enough potassium from fruits and vegetables. High blood pressure is a  leading risk factor for heart attack, stroke, congestive heart failure, dementia, kidney failure, and premature death. Long-term effects of excessive salt intake include stiffening of the arteries and thickening of heart muscle and organ damage. Recommendations include ways to reduce hypertension and the risk of heart disease.  Diseases of Our Time - Focusing on Diabetes Clinical staff conducted group or individual video education with verbal and written material and guidebook.  Patient learns why the best way to stop diseases of our time is prevention, through food and other lifestyle changes. Medicine (such as prescription pills and surgeries) is often only a Band-Aid on the problem, not a long-term solution. Most common diseases of our time include obesity, type 2 diabetes, hypertension, heart disease, and cancer. The Pritikin Program is recommended and has been proven to help reduce, reverse, and/or prevent the damaging effects of metabolic syndrome.  Nutrition   Overview of the Pritikin Eating Plan  Clinical staff conducted group or individual video education with verbal and written material and guidebook.  Patient learns about the [place] for disease risk reduction. The [place] emphasizes a wide variety of unrefined, minimally-processed carbohydrates, like fruits, vegetables, whole grains, and legumes. Go, Caution, and Stop food choices are explained. Plant-based and lean animal proteins are emphasized. Rationale provided for low sodium intake for blood pressure control, low added sugars for blood sugar stabilization, and low added fats and oils for coronary artery disease risk reduction and weight management.  Calorie Density  Clinical staff conducted group or  individual video education with verbal and written material and guidebook.  Patient learns about calorie density and how it impacts the Pritikin Eating Plan. Knowing the characteristics of the food you choose will  help you decide whether those foods will lead to weight gain or weight loss, and whether you want to consume more or less of them. Weight loss is usually a side effect of the Pritikin Eating Plan because of its focus on low calorie-dense foods.  Label Reading  Clinical staff conducted group or individual video education with verbal and written material and guidebook.  Patient learns about the Pritikin recommended label reading guidelines and corresponding recommendations regarding calorie density, added sugars, sodium content, and whole grains.  Dining Out - Part 1  Clinical staff conducted group or individual video education with verbal and written material and guidebook.  Patient learns that restaurant meals can be sabotaging because they can be so high in calories, fat, sodium, and/or sugar. Patient learns recommended strategies on how to positively address this and avoid unhealthy pitfalls.  Facts on Fats  Clinical staff conducted group or individual video education with verbal and written material and guidebook.  Patient learns that lifestyle modifications can be just as effective, if not more so, as many medications for lowering your risk of heart disease. A Pritikin lifestyle can help to reduce your risk of inflammation and atherosclerosis (cholesterol build-up, or plaque, in the artery walls). Lifestyle interventions such as dietary choices and physical activity address the cause of atherosclerosis. A review of the types of fats and their impact on blood cholesterol levels, along with dietary recommendations to reduce fat intake is also included.  Nutrition Action Plan  Clinical staff conducted group or individual video education with verbal and written material and guidebook.  Patient learns how to incorporate Pritikin recommendations into their lifestyle. Recommendations include planning and keeping personal health goals in mind as an important part of their success.  Healthy Mind-Set     Healthy Minds, Bodies, Hearts  Clinical staff conducted group or individual video education with verbal and written material and guidebook.  Patient learns how to identify when they are stressed. Video will discuss the impact of that stress, as well as the many benefits of stress management. Patient will also be introduced to stress management techniques. The way we think, act, and feel has an impact on our hearts.  How Our Thoughts Can Heal Our Hearts  Clinical staff conducted group or individual video education with verbal and written material and guidebook.  Patient learns that negative thoughts can cause depression and anxiety. This can result in negative lifestyle behavior and serious health problems. Cognitive behavioral therapy is an effective method to help control our thoughts in order to change and improve our emotional outlook.  Additional Videos:  Exercise    Improving Performance  Clinical staff conducted group or individual video education with verbal and written material and guidebook.  Patient learns to use a non-linear approach by alternating intensity levels and lengths of time spent exercising to help burn more calories and lose more body fat. Cardiovascular exercise helps improve heart health, metabolism, hormonal balance, blood sugar control, and recovery from fatigue. Resistance training improves strength, endurance, balance, coordination, reaction time, metabolism, and muscle mass. Flexibility exercise improves circulation, posture, and balance. Seek guidance from your physician and exercise physiologist before implementing an exercise routine and learn your capabilities and proper form for all exercise.  Introduction to Yoga  Clinical staff conducted group or individual video education  with verbal and written material and guidebook.  Patient learns about yoga, a discipline of the coming together of mind, breath, and body. The benefits of yoga include improved flexibility,  improved range of motion, better posture and core strength, increased lung function, weight loss, and positive self-image. Yoga's heart health benefits include lowered blood pressure, healthier heart rate, decreased cholesterol and triglyceride levels, improved immune function, and reduced stress. Seek guidance from your physician and exercise physiologist before implementing an exercise routine and learn your capabilities and proper form for all exercise.  Medical   Aging: Enhancing Your Quality of Life  Clinical staff conducted group or individual video education with verbal and written material and guidebook.  Patient learns key strategies and recommendations to stay in good physical health and enhance quality of life, such as prevention strategies, having an advocate, securing a [place], and keeping a list of medications and system for tracking them. It also discusses how to avoid risk for bone loss.  Biology of Weight Control  Clinical staff conducted group or individual video education with verbal and written material and guidebook.  Patient learns that weight gain occurs because we consume more calories than we burn (eating more, moving less). Even if your body weight is normal, you may have higher ratios of fat compared to muscle mass. Too much body fat puts you at increased risk for cardiovascular disease, heart attack, stroke, type 2 diabetes, and obesity-related cancers. In addition to exercise, following the [place] can help reduce your risk.  Decoding Lab Results  Clinical staff conducted group or individual video education with verbal and written material and guidebook.  Patient learns that lab test reflects one measurement whose values change over time and are influenced by many factors, including medication, stress, sleep, exercise, food, hydration, pre-existing medical conditions, and more. It is recommended to use the knowledge from this  video to become more involved with your lab results and evaluate your numbers to speak with your doctor.   Diseases of Our Time - Overview  Clinical staff conducted group or individual video education with verbal and written material and guidebook.  Patient learns that according to the CDC, 50% to 70% of chronic diseases (such as obesity, type 2 diabetes, elevated lipids, hypertension, and heart disease) are avoidable through lifestyle improvements including healthier food choices, listening to satiety cues, and increased physical activity.  Sleep Disorders Clinical staff conducted group or individual video education with verbal and written material and guidebook.  Patient learns how good quality and duration of sleep are important to overall health and well-being. Patient also learns about sleep disorders and how they impact health along with recommendations to address them, including discussing with a physician.  Nutrition  Dining Out - Part 2 Clinical staff conducted group or individual video education with verbal and written material and guidebook.  Patient learns how to plan ahead and communicate in order to maximize their dining experience in a healthy and nutritious manner. Included are recommended food choices based on the type of restaurant the patient is visiting.   Fueling a Best boy conducted group or individual video education with verbal and written material and guidebook.  There is a strong connection between our food choices and our health. Diseases like obesity and type 2 diabetes are very prevalent and are in large-part due to lifestyle choices. The Pritikin Eating Plan provides plenty of food and hunger-curbing satisfaction. It is easy to follow,  affordable, and helps reduce health risks.  Menu Workshop  Clinical staff conducted group or individual video education with verbal and written material and guidebook.  Patient learns that restaurant meals can  sabotage health goals because they are often packed with calories, fat, sodium, and sugar. Recommendations include strategies to plan ahead and to communicate with the manager, chef, or server to help order a healthier meal.  Planning Your Eating Strategy  Clinical staff conducted group or individual video education with verbal and written material and guidebook.  Patient learns about the [place] and its benefit of reducing the risk of disease. The [place] does not focus on calories. Instead, it emphasizes high-quality, nutrient-rich foods. By knowing the characteristics of the foods, we choose, we can determine their calorie density and make informed decisions.  Targeting Your Nutrition Priorities  Clinical staff conducted group or individual video education with verbal and written material and guidebook.  Patient learns that lifestyle habits have a tremendous impact on disease risk and progression. This video provides eating and physical activity recommendations based on your personal health goals, such as reducing LDL cholesterol, losing weight, preventing or controlling type 2 diabetes, and reducing high blood pressure.  Vitamins and Minerals  Clinical staff conducted group or individual video education with verbal and written material and guidebook.  Patient learns different ways to obtain key vitamins and minerals, including through a recommended healthy diet. It is important to discuss all supplements you take with your doctor.   Healthy Mind-Set    Smoking Cessation  Clinical staff conducted group or individual video education with verbal and written material and guidebook.  Patient learns that cigarette smoking and tobacco addiction pose a serious health risk which affects millions of people. Stopping smoking will significantly reduce the risk of heart disease, lung disease, and many forms of cancer. Recommended strategies for quitting are covered, including  working with your doctor to develop a successful plan.  Culinary   Becoming a Financial trader conducted group or individual video education with verbal and written material and guidebook.  Patient learns that cooking at home can be healthy, cost-effective, quick, and puts them in control. Keys to cooking healthy recipes will include looking at your recipe, assessing your equipment needs, planning ahead, making it simple, choosing cost-effective seasonal ingredients, and limiting the use of added fats, salts, and sugars.  Cooking - Breakfast and Snacks  Clinical staff conducted group or individual video education with verbal and written material and guidebook.  Patient learns how important breakfast is to satiety and nutrition through the entire day. Recommendations include key foods to eat during breakfast to help stabilize blood sugar levels and to prevent overeating at meals later in the day. Planning ahead is also a key component.  Cooking - Human resources officer conducted group or individual video education with verbal and written material and guidebook.  Patient learns eating strategies to improve overall health, including an approach to cook more at home. Recommendations include thinking of animal protein as a side on your plate rather than center stage and focusing instead on lower calorie dense options like vegetables, fruits, whole grains, and plant-based proteins, such as beans. Making sauces in large quantities to freeze for later and leaving the skin on your vegetables are also recommended to maximize your experience.  Cooking - Healthy Salads and Dressing Clinical staff conducted group or individual video education with verbal and written material and guidebook.  Patient learns  that vegetables, fruits, whole grains, and legumes are the foundations of the [place]. Recommendations include how to incorporate each of these in flavorful and healthy  salads, and how to create homemade salad dressings. Proper handling of ingredients is also covered. Cooking - Soups and [employer] - Soups and Desserts Clinical staff conducted group or individual video education with verbal and written material and guidebook.  Patient learns that Pritikin soups and desserts make for easy, nutritious, and delicious snacks and meal components that are low in sodium, fat, sugar, and calorie density, while high in vitamins, minerals, and filling fiber. Recommendations include simple and healthy ideas for soups and desserts.   Overview     The Pritikin Solution Program Overview Clinical staff conducted group or individual video education with verbal and written material and guidebook.  Patient learns that the results of the [place] Program have been documented in more than 100 articles published in peer-reviewed journals, and the benefits include reducing risk factors for (and, in some cases, even reversing) high cholesterol, high blood pressure, type 2 diabetes, obesity, and more! An overview of the three key pillars of the Pritikin Program will be covered: eating well, doing regular exercise, and having a healthy mind-set.  WORKSHOPS  Exercise: Exercise Basics: Building Your Action Plan Clinical staff led group instruction and group discussion with PowerPoint presentation and patient guidebook. To enhance the learning environment the use of posters, models and videos may be added. At the conclusion of this workshop, patients will comprehend the difference between physical activity and exercise, as well as the benefits of incorporating both, into their routine. Patients will understand the FITT (Frequency, Intensity, Time, and Type) principle and how to use it to build an exercise action plan. In addition, safety concerns and other considerations for exercise and cardiac rehab will be addressed by the presenter. The purpose of this lesson is to promote a  comprehensive and effective weekly exercise routine in order to improve patients' overall level of fitness.   Managing Heart Disease: Your Path to a Healthier Heart Clinical staff led group instruction and group discussion with PowerPoint presentation and patient guidebook. To enhance the learning environment the use of posters, models and videos may be added.At the conclusion of this workshop, patients will understand the anatomy and physiology of the heart. Additionally, they will understand how Pritikin's three pillars impact the risk factors, the progression, and the management of heart disease.  The purpose of this lesson is to provide a high-level overview of the heart, heart disease, and how the Pritikin lifestyle positively impacts risk factors.  Exercise Biomechanics Clinical staff led group instruction and group discussion with PowerPoint presentation and patient guidebook. To enhance the learning environment the use of posters, models and videos may be added. Patients will learn how the structural parts of their bodies function and how these functions impact their daily activities, movement, and exercise. Patients will learn how to promote a neutral spine, learn how to manage pain, and identify ways to improve their physical movement in order to promote healthy living. The purpose of this lesson is to expose patients to common physical limitations that impact physical activity. Participants will learn practical ways to adapt and manage aches and pains, and to minimize their effect on regular exercise. Patients will learn how to maintain good posture while sitting, walking, and lifting.  Balance Training and Fall Prevention  Clinical staff led group instruction and group discussion with PowerPoint presentation and patient guidebook. To enhance  the learning environment the use of posters, models and videos may be added. At the conclusion of this workshop, patients will understand the  importance of their sensorimotor skills (vision, proprioception, and the vestibular system) in maintaining their ability to balance as they age. Patients will apply a variety of balancing exercises that are appropriate for their current level of function. Patients will understand the common causes for poor balance, possible solutions to these problems, and ways to modify their physical environment in order to minimize their fall risk. The purpose of this lesson is to teach patients about the importance of maintaining balance as they age and ways to minimize their risk of falling.  WORKSHOPS   Nutrition:  Fueling a Scientist, research (physical sciences) led group instruction and group discussion with PowerPoint presentation and patient guidebook. To enhance the learning environment the use of posters, models and videos may be added. Patients will review the foundational principles of the [place] and understand what constitutes a serving size in each of the food groups. Patients will also learn Pritikin-friendly foods that are better choices when away from home and review make-ahead meal and snack options. Calorie density will be reviewed and applied to three nutrition priorities: weight maintenance, weight loss, and weight gain. The purpose of this lesson is to reinforce (in a group setting) the key concepts around what patients are recommended to eat and how to apply these guidelines when away from home by planning and selecting Pritikin-friendly options. Patients will understand how calorie density may be adjusted for different weight management goals.  Mindful Eating  Clinical staff led group instruction and group discussion with PowerPoint presentation and patient guidebook. To enhance the learning environment the use of posters, models and videos may be added. Patients will briefly review the concepts of the [place] and the importance of low-calorie dense foods. The concept of mindful  eating will be introduced as well as the importance of paying attention to internal hunger signals. Triggers for non-hunger eating and techniques for dealing with triggers will be explored. The purpose of this lesson is to provide patients with the opportunity to review the basic principles of the [place], discuss the value of eating mindfully and how to measure internal cues of hunger and fullness using the Hunger Scale. Patients will also discuss reasons for non-hunger eating and learn strategies to use for controlling emotional eating.  Targeting Your Nutrition Priorities Clinical staff led group instruction and group discussion with PowerPoint presentation and patient guidebook. To enhance the learning environment the use of posters, models and videos may be added. Patients will learn how to determine their genetic susceptibility to disease by reviewing their family history. Patients will gain insight into the importance of diet as part of an overall healthy lifestyle in mitigating the impact of genetics and other environmental insults. The purpose of this lesson is to provide patients with the opportunity to assess their personal nutrition priorities by looking at their family history, their own health history and current risk factors. Patients will also be able to discuss ways of prioritizing and modifying the [place] for their highest risk areas  Menu  Clinical staff led group instruction and group discussion with PowerPoint presentation and patient guidebook. To enhance the learning environment the use of posters, models and videos may be added. Using menus brought in from [employer], or printed from [employer], patients will apply the [place] dining out guidelines that were presented in the Pritikin  Dining Out educational video. Patients will also be able to practice these guidelines in a variety of provided scenarios. The purpose of this lesson is to provide  patients with the opportunity to practice hands-on learning of the [place] with actual menus and practice scenarios.  Label Reading Clinical staff led group instruction and group discussion with PowerPoint presentation and patient guidebook. To enhance the learning environment the use of posters, models and videos may be added. Patients will review and discuss the Pritikin label reading guidelines presented in Pritikin's Label Reading Educational series video. Using fool labels brought in from local grocery stores and markets, patients will apply the label reading guidelines and determine if the packaged food meet the Pritikin guidelines. The purpose of this lesson is to provide patients with the opportunity to review, discuss, and practice hands-on learning of the P[facility] Label Reading guidelines with actual packaged food labels. [facility] are designed to teach patients ways to prepare quick, simple, and affordable recipes at home. The importance of nutrition's role in chronic disease risk reduction is reflected in its emphasis in the overall P[facility]in program. By learning how to prepare essential core Pritikin Eating Plan recipes, patients will increase control over what they eat; be able to customize the flavor of foods without the use of added salt, sugar, or fat; and improve the quality of the food they consume. By learning a set of core recipes which are easily assembled, quickly prepared, and affordable, patients are more likely to prepare more healthy foods at home. These workshops focus on convenient breakfasts, simple entres, side dishes, and desserts which can be prepared with minimal effort and are consistent with nutrition recommendations for cardiovascular risk reduction. [employer]ing [employer] are taught by a Engineer, materials (RD) who has been trained by the [employer]. The chef or RD has a clear  understanding of the importance of minimizing - if not completely eliminating - added fat, sugar, and sodium in recipes. Throughout the series of [place] Workshop sessions, patients will learn about healthy ingredients and efficient methods of cooking to build confidence in their capability to prepare    Cooking School weekly topics:  Adding Flavor- Sodium-Free  Fast and Healthy Breakfasts  Powerhouse Plant-Based Proteins  Satisfying Salads and Dressings  Simple Sides and Sauces  International Cuisine-Spotlight on the [place] Zones  Delicious Desserts  Savory Soups  Efficiency Cooking - Meals in a Snap  Tasty Appetizers and Snacks  Comforting Weekend Breakfasts  One-Pot Wonders   Fast Evening Meals  Easy [place] (Psychosocial): New Thoughts, New Behaviors Clinical staff led group instruction and group discussion with PowerPoint presentation and patient guidebook. To enhance the learning environment the use of posters, models and videos may be added. Patients will learn and practice techniques for developing effective health and lifestyle goals. Patients will be able to effectively apply the goal setting process learned to develop at least one new personal goal.  The purpose of this lesson is to expose patients to a new skill set of behavior modification techniques such as techniques setting SMART goals, overcoming barriers, and achieving new thoughts and new behaviors.  Managing Moods and Relationships Clinical staff led group instruction and group discussion with PowerPoint presentation and patient guidebook. To enhance the learning environment the use of posters, models and videos may be added. Patients will learn how emotional and chronic stress factors  can impact their health and relationships. They will learn healthy ways to manage their moods and utilize positive coping mechanisms. In addition, [facility] patients  will learn ways to improve communication skills. The purpose of this lesson is to expose patients to ways of understanding how one's mood and health are intimately connected. Developing a healthy outlook can help build positive relationships and connections with others. Patients will understand the importance of utilizing effective communication skills that include actively listening and being heard. They will learn and understand the importance of the "4 Cs" and especially Connections in fostering of a Healthy Mind-Set.  Healthy Sleep for a Healthy Heart Clinical staff led group instruction and group discussion with PowerPoint presentation and patient guidebook. To enhance the learning environment the use of posters, models and videos may be added. At the conclusion of this workshop, patients will be able to demonstrate knowledge of the importance of sleep to overall health, well-being, and quality of life. They will understand the symptoms of, and treatments for, common sleep disorders. Patients will also be able to identify daytime and nighttime behaviors which impact sleep, and they will be able to apply these tools to help manage sleep-related challenges. The purpose of this lesson is to provide patients with a general overview of sleep and outline the importance of quality sleep. Patients will learn about a few of the most common sleep disorders. Patients will also be introduced to the concept of "sleep hygiene," and discover ways to self-manage certain sleeping problems through simple daily behavior changes. Finally, the workshop will motivate patients by clarifying the links between quality sleep and their goals of heart-healthy living.   Recognizing and Reducing Stress Clinical staff led group instruction and group discussion with PowerPoint presentation and patient guidebook. To enhance the learning environment the use of posters, models and videos may be added. At the conclusion of this workshop,  patients will be able to understand the types of stress reactions, differentiate between acute and chronic stress, and recognize the impact that chronic stress has on their health. They will also be able to apply different coping mechanisms, such as reframing negative self-talk. Patients will have the opportunity to practice a variety of stress management techniques, such as deep abdominal breathing, progressive muscle relaxation, and/or guided imagery.  The purpose of this lesson is to educate patients on the role of stress in their lives and to provide healthy techniques for coping with it.  Learning Barriers/Preferences:  Learning Barriers/Preferences - 08/03/22 1303       Learning Barriers/Preferences   Learning Barriers Sight   wears glasses   Learning Preferences Written Material;Computer/Internet;Pictoral;Video             Education Topics:  Knowledge Questionnaire Score:  Knowledge Questionnaire Score - 08/03/22 1302       Knowledge Questionnaire Score   Pre Score 17/24             Core Components/Risk Factors/Patient Goals at Admission:  Personal Goals and Risk Factors at Admission - 08/03/22 1302       Core Components/Risk Factors/Patient Goals on Admission    Weight Management Yes    Intervention Weight Management: Develop a combined nutrition and exercise program designed to reach desired caloric intake, while maintaining appropriate intake of nutrient and fiber, sodium and fats, and appropriate energy expenditure required for the weight goal.;Weight Management: Provide education and appropriate resources to help participant work on and attain dietary goals.    Expected Outcomes Short Term: Continue to assess  and modify interventions until short term weight is achieved;Long Term: Adherence to nutrition and physical activity/exercise program aimed toward attainment of established weight goal;Understanding recommendations for meals to include 15-35% energy as protein,  25-35% energy from fat, 35-60% energy from carbohydrates, less than '200mg'$  of dietary cholesterol, 20-35 gm of total fiber daily    Hypertension Yes    Intervention Provide education on lifestyle modifcations including regular physical activity/exercise, weight management, moderate sodium restriction and increased consumption of fresh fruit, vegetables, and low fat dairy, alcohol moderation, and smoking cessation.;Monitor prescription use compliance.    Expected Outcomes Short Term: Continued assessment and intervention until BP is < 140/28m HG in hypertensive participants. < 130/821mHG in hypertensive participants with diabetes, heart failure or chronic kidney disease.;Long Term: Maintenance of blood pressure at goal levels.    Lipids Yes    Intervention Provide education and support for participant on nutrition & aerobic/resistive exercise along with prescribed medications to achieve LDL '70mg'$ , HDL >'40mg'$ .    Expected Outcomes Short Term: Participant states understanding of desired cholesterol values and is compliant with medications prescribed. Participant is following exercise prescription and nutrition guidelines.;Long Term: Cholesterol controlled with medications as prescribed, with individualized exercise RX and with personalized nutrition plan. Value goals: LDL < '70mg'$ , HDL > 40 mg.    Stress Yes    Intervention Offer individual and/or small group education and counseling on adjustment to heart disease, stress management and health-related lifestyle change. Teach and support self-help strategies.;Refer participants experiencing significant psychosocial distress to appropriate mental health specialists for further evaluation and treatment. When possible, include family members and significant others in education/counseling sessions.    Expected Outcomes Short Term: Participant demonstrates changes in health-related behavior, relaxation and other stress management skills, ability to obtain effective  social support, and compliance with psychotropic medications if prescribed.;Long Term: Emotional wellbeing is indicated by absence of clinically significant psychosocial distress or social isolation.             Core Components/Risk Factors/Patient Goals Review:   Goals and Risk Factor Review     Row Name 08/10/22 1513             Core Components/Risk Factors/Patient Goals Review   Personal Goals Review Weight Management/Obesity;Hypertension;Lipids;Stress       Review Jeffery Landrytarted intensive cardiac rehab on 08/09/22. Vital signs were stable       Expected Outcomes Jeffery Landryill continue to participate in intensive cardiac rehab for exercise, nutrition and lifestyle modifications                Core Components/Risk Factors/Patient Goals at Discharge (Final Review):   Goals and Risk Factor Review - 08/10/22 1513       Core Components/Risk Factors/Patient Goals Review   Personal Goals Review Weight Management/Obesity;Hypertension;Lipids;Stress    Review Jeffery Landrytarted intensive cardiac rehab on 08/09/22. Vital signs were stable    Expected Outcomes Jeffery Landryill continue to participate in intensive cardiac rehab for exercise, nutrition and lifestyle modifications             ITP Comments:  ITP Comments     Row Name 08/03/22 1016 08/10/22 1507 08/29/22 1014       ITP Comments TrFransico HimMD, Medical Director.  Introduction to the [facility] Education Program/Intensive Cardiac Rehab. Initial packet reviewed with the patient. 30 Day ITP Review. Jeffery Landrytarted intensive cardiac rehab on 08/09/22 and did well with exercise 30 Day ITP Review. Jeffery Landryas good attendance and participation in intensive cardiac rehab  Comments: See ITP comments.

## 2022-08-30 ENCOUNTER — Encounter (HOSPITAL_COMMUNITY)
Admission: RE | Admit: 2022-08-30 | Discharge: 2022-08-30 | Disposition: A | Discharge status: Home / Self Care | Payer: Medicare Other | Source: Ambulatory Visit | Attending: Internal Medicine | Admitting: Internal Medicine

## 2022-08-30 DIAGNOSIS — Z955 Presence of coronary angioplasty implant and graft: Secondary | ICD-10-CM

## 2022-08-30 DIAGNOSIS — Z48812 Encounter for surgical aftercare following surgery on the circulatory system: Secondary | ICD-10-CM | POA: Diagnosis not present

## 2022-08-30 DIAGNOSIS — R079 Chest pain, unspecified: Secondary | ICD-10-CM | POA: Diagnosis not present

## 2022-08-31 ENCOUNTER — Ambulatory Visit (HOSPITAL_COMMUNITY): Payer: Medicare Other

## 2022-09-01 ENCOUNTER — Encounter (HOSPITAL_COMMUNITY): Payer: Medicare Other

## 2022-09-04 ENCOUNTER — Encounter (HOSPITAL_COMMUNITY)
Admission: RE | Admit: 2022-09-04 | Discharge: 2022-09-04 | Disposition: A | Discharge status: Home / Self Care | Payer: Medicare Other | Source: Ambulatory Visit | Attending: Internal Medicine | Admitting: Internal Medicine

## 2022-09-04 DIAGNOSIS — Z955 Presence of coronary angioplasty implant and graft: Secondary | ICD-10-CM | POA: Insufficient documentation

## 2022-09-04 DIAGNOSIS — R079 Chest pain, unspecified: Secondary | ICD-10-CM

## 2022-09-06 ENCOUNTER — Encounter (HOSPITAL_COMMUNITY)
Admission: RE | Admit: 2022-09-06 | Discharge: 2022-09-06 | Disposition: A | Discharge status: Home / Self Care | Payer: Medicare Other | Source: Ambulatory Visit | Attending: Internal Medicine | Admitting: Internal Medicine

## 2022-09-06 DIAGNOSIS — R079 Chest pain, unspecified: Secondary | ICD-10-CM | POA: Diagnosis not present

## 2022-09-06 DIAGNOSIS — Z955 Presence of coronary angioplasty implant and graft: Secondary | ICD-10-CM

## 2022-09-08 ENCOUNTER — Telehealth (HOSPITAL_COMMUNITY): Payer: Self-pay | Admitting: *Deleted

## 2022-09-08 ENCOUNTER — Encounter (HOSPITAL_COMMUNITY)
Admission: RE | Admit: 2022-09-08 | Discharge: 2022-09-08 | Disposition: A | Discharge status: Home / Self Care | Payer: Medicare Other | Source: Ambulatory Visit | Attending: Internal Medicine | Admitting: Internal Medicine

## 2022-09-08 DIAGNOSIS — Z955 Presence of coronary angioplasty implant and graft: Secondary | ICD-10-CM | POA: Diagnosis not present

## 2022-09-08 DIAGNOSIS — R079 Chest pain, unspecified: Secondary | ICD-10-CM | POA: Diagnosis not present

## 2022-09-11 ENCOUNTER — Encounter (HOSPITAL_COMMUNITY)
Admission: RE | Admit: 2022-09-11 | Discharge: 2022-09-11 | Disposition: A | Discharge status: Home / Self Care | Payer: Medicare Other | Source: Ambulatory Visit | Attending: Internal Medicine | Admitting: Internal Medicine

## 2022-09-11 DIAGNOSIS — Z955 Presence of coronary angioplasty implant and graft: Secondary | ICD-10-CM | POA: Diagnosis not present

## 2022-09-11 DIAGNOSIS — R079 Chest pain, unspecified: Secondary | ICD-10-CM | POA: Diagnosis not present

## 2022-09-13 ENCOUNTER — Encounter (HOSPITAL_COMMUNITY)
Admission: RE | Admit: 2022-09-13 | Discharge: 2022-09-13 | Disposition: A | Discharge status: Home / Self Care | Payer: Medicare Other | Source: Ambulatory Visit | Attending: Internal Medicine | Admitting: Internal Medicine

## 2022-09-13 DIAGNOSIS — Z955 Presence of coronary angioplasty implant and graft: Secondary | ICD-10-CM | POA: Diagnosis not present

## 2022-09-13 DIAGNOSIS — R079 Chest pain, unspecified: Secondary | ICD-10-CM | POA: Diagnosis not present

## 2022-09-15 ENCOUNTER — Encounter (HOSPITAL_COMMUNITY)
Admission: RE | Admit: 2022-09-15 | Discharge: 2022-09-15 | Disposition: A | Discharge status: Home / Self Care | Payer: Medicare Other | Source: Ambulatory Visit | Attending: Internal Medicine | Admitting: Internal Medicine

## 2022-09-15 DIAGNOSIS — Z955 Presence of coronary angioplasty implant and graft: Secondary | ICD-10-CM | POA: Diagnosis not present

## 2022-09-15 DIAGNOSIS — R079 Chest pain, unspecified: Secondary | ICD-10-CM | POA: Diagnosis not present

## 2022-09-18 ENCOUNTER — Other Ambulatory Visit: Payer: Self-pay | Admitting: Internal Medicine

## 2022-09-18 ENCOUNTER — Encounter (HOSPITAL_COMMUNITY)
Admission: RE | Admit: 2022-09-18 | Discharge: 2022-09-18 | Disposition: A | Discharge status: Home / Self Care | Payer: Medicare Other | Source: Ambulatory Visit | Attending: Internal Medicine | Admitting: Internal Medicine

## 2022-09-18 DIAGNOSIS — Z955 Presence of coronary angioplasty implant and graft: Secondary | ICD-10-CM | POA: Diagnosis not present

## 2022-09-18 DIAGNOSIS — R079 Chest pain, unspecified: Secondary | ICD-10-CM

## 2022-09-20 ENCOUNTER — Encounter (HOSPITAL_COMMUNITY)
Admission: RE | Admit: 2022-09-20 | Discharge: 2022-09-20 | Disposition: A | Discharge status: Home / Self Care | Payer: Medicare Other | Source: Ambulatory Visit | Attending: Internal Medicine | Admitting: Internal Medicine

## 2022-09-20 DIAGNOSIS — R079 Chest pain, unspecified: Secondary | ICD-10-CM | POA: Diagnosis not present

## 2022-09-20 DIAGNOSIS — Z955 Presence of coronary angioplasty implant and graft: Secondary | ICD-10-CM | POA: Diagnosis not present

## 2022-09-20 NOTE — Progress Notes (Signed)
Reviewed home exercise Rx with patient today.  Encouraged warm-up, cool-down, and stretching. Reviewed THRR of  58 - 117 and keeping RPE between 11-13. Encouraged to hydrate with activity.  Reviewed weather parameters for temperature and humidity for safe exercise outdoors. Reviewed S/S to terminate exercise and when to call 911 vs MD. Reviewed the use of NTG and pt was encouraged to carry at all times. Pt encouraged to always carry a cell phone for safety when exercising outdoors. Pt verbalized understanding of the home exercise Rx and was provided a copy.    Hanad Leino MS, ACSM-CEP, CCRP  

## 2022-09-20 NOTE — Progress Notes (Signed)
Cardiac Individual Treatment Plan  Patient Details  Name: Jeffery Kirby MRN: [id number] Date of Birth: [birth year] Referring Provider:   Flowsheet Row INTENSIVE CARDIAC REHAB ORIENT from 08/03/2022 in [facility] for Heart, Vascular, & [place]  Referring Provider Meryle Ready, MD       Initial Encounter Date:  [place] from 08/03/2022 in [facility] for Heart, Vascular, & [place]  Date 08/03/22       Visit Diagnosis: 04/25/22 Coronary artey stent placement LAD  Chest pain, unspecified type  Patient's Home Medications on Admission:  Current Outpatient Medications:    alum hydroxide-mag trisilicate (GAVISCON) [id number] MG CHEW chewable tablet, Chew 2 tablets by mouth daily as needed for indigestion or heartburn., Disp: , Rfl:    aspirin EC 81 MG tablet, Take 1 tablet (81 mg total) by mouth daily. Swallow whole., Disp: 90 tablet, Rfl: 3   clopidogrel (PLAVIX) 75 MG tablet, Take 1 tablet (75 mg total) by mouth daily., Disp: 90 tablet, Rfl: 3   Cyanocobalamin (B-12) 1000 MCG SUBL, Place 1,000 mcg under the tongue 2 (two) times a week., Disp: , Rfl:    metoprolol tartrate (LOPRESSOR) 25 MG tablet, Take 0.5 tablets (12.5 mg total) by mouth 2 (two) times daily., Disp: 90 tablet, Rfl: 3   nitroGLYCERIN (NITROSTAT) 0.4 MG SL tablet, Place 1 tablet (0.4 mg total) under the tongue every 5 (five) minutes as needed for chest pain., Disp: 25 tablet, Rfl: 3   pantoprazole (PROTONIX) 40 MG tablet, Take 1 tablet (40 mg total) by mouth daily., Disp: 30 tablet, Rfl: 11   Polyethyl Glycol-Propyl Glycol (SYSTANE OP), Place 1 drop into both eyes daily as needed (dry eyes)., Disp: , Rfl:    rosuvastatin (CRESTOR) 40 MG tablet, TAKE ONE TABLET BY MOUTH DAILY, Disp: 90 tablet, Rfl: 3  Past Medical History: Past Medical History:  Diagnosis Date   Angina pectoris ([place])    Cataract    Exertional chest pain     Hyperlipidemia    Hypertension    Shingles 2012   Tinnitus     Tobacco Use: Social History   Tobacco Use  Smoking Status Never  Smokeless Tobacco Never    Labs: Review Flowsheet       Latest Ref Rng & Units 11/23/2020  Labs for ITP Cardiac and Pulmonary Rehab  Cholestrol 100 - 199 mg/dL 116   LDL (calc) 0 - 99 mg/dL 49   HDL-C >39 mg/dL 52   Trlycerides 0 - 149 mg/dL 73     Capillary Blood Glucose: No results found for: "GLUCAP"   Exercise Target Goals: Exercise Program Goal: Individual exercise prescription set using results from initial 6 min walk test and THRR while considering  patient's activity barriers and safety.   Exercise Prescription Goal: Initial exercise prescription builds to 30-45 minutes a day of aerobic activity, 2-3 days per week.  Home exercise guidelines will be given to patient during program as part of exercise prescription that the participant will acknowledge.  Activity Barriers & Risk Stratification:  Activity Barriers & Cardiac Risk Stratification - 08/03/22 1257       Activity Barriers & Cardiac Risk Stratification   Activity Barriers Back Problems;Deconditioning;Chest Pain/Angina;Balance Concerns;History of Falls;Joint Problems;Neck/Spine Problems    Cardiac Risk Stratification High             6 Minute Walk:  6 Minute Walk     Row Name 08/03/22 1256  6 Minute Walk   Phase Initial     Distance 1416 feet     Walk Time 6 minutes     # of Rest Breaks 0     MPH 2.7     METS 2.9     RPE 11     Perceived Dyspnea  0     VO2 Peak 10.2     Symptoms No     Resting HR 78 bpm     Resting BP 110/70     Resting Oxygen Saturation  97 %     Exercise Oxygen Saturation  during 6 min walk 99 %     Max Ex. HR 79 bpm     Max Ex. BP 118/70     2 Minute Post BP 112/71              Oxygen Initial Assessment:   Oxygen Re-Evaluation:   Oxygen Discharge (Final Oxygen Re-Evaluation):   Initial Exercise Prescription:   Initial Exercise Prescription - 08/03/22 1300       Date of Initial Exercise RX and Referring Provider   Date 08/03/22    Referring Provider Meryle Ready, MD    Expected Discharge Date 09/29/22      Bike   Level 1    Watts 25    Minutes 15    METs 2.9      NuStep   Level 1    SPM 75    Minutes 15    METs 2.9      Prescription Details   Frequency (times per week) 3    Duration Progress to 30 minutes of continuous aerobic without signs/symptoms of physical distress      Intensity   THRR 40-80% of Max Heartrate 58-117    Ratings of Perceived Exertion 11-13    Perceived Dyspnea 0-4      Progression   Progression Continue progressive overload as per policy without signs/symptoms or physical distress.      Resistance Training   Training Prescription Yes    Weight 3 lbs    Reps 10-15             Perform Capillary Blood Glucose checks as needed.  Exercise Prescription Changes:   Exercise Prescription Changes     Row Name 08/09/22 1400 08/23/22 1400 09/06/22 1600 09/20/22 1400       Response to Exercise   Blood Pressure (Admit) 108/64 112/62 100/60 110/72    Blood Pressure (Exercise) 128/72 144/60 128/70 132/80    Blood Pressure (Exit) 112/60 122/70 98/62 118/60    Heart Rate (Admit) 65 bpm 65 bpm 66 bpm 60 bpm    Heart Rate (Exercise) 96 bpm 93 bpm 96 bpm 108 bpm    Heart Rate (Exit) 69 bpm 65 bpm 75 bpm 58 bpm    Rating of Perceived Exertion (Exercise) 11 10 10 11     Symptoms None None None None    Comments Pt's first day in the CRP2 program Reviewed METs Reviewed METs and goals Reviewed Home exercise Rx    Duration Continue with 30 min of aerobic exercise without signs/symptoms of physical distress. Continue with 30 min of aerobic exercise without signs/symptoms of physical distress. Continue with 30 min of aerobic exercise without signs/symptoms of physical distress. Continue with 30 min of aerobic exercise without signs/symptoms of physical distress.     Intensity THRR unchanged THRR unchanged THRR unchanged THRR unchanged      Progression   Progression Continue to progress workloads  to maintain intensity without signs/symptoms of physical distress. Continue to progress workloads to maintain intensity without signs/symptoms of physical distress. Continue to progress workloads to maintain intensity without signs/symptoms of physical distress. Continue to progress workloads to maintain intensity without signs/symptoms of physical distress.    Average METs 2.55 3.2 3.4 3.53      Resistance Training   Training Prescription No No No No    Weight No weights on Wednesdays No weights on Wednesdays No weights on Wednesdays No weights on Wednesdays      Interval Training   Interval Training No No No No      Bike   Level 1 2 2 3     Watts 17 -- 46 --    Minutes 15 15 15 15     METs 3.2 4.1 4.8 5      NuStep   Level 1 2 2 3     SPM 79 97 90 93    Minutes 15 15 15 15     METs 1.9 2.3 2 2.1      Home Exercise Plan   Plans to continue exercise at -- -- -- [employer] (comment)    Frequency -- -- -- Add 2 additional days to program exercise sessions.    Initial Home Exercises Provided -- -- -- 09/20/22             Exercise Comments:   Exercise Comments     Row Name 08/09/22 1436 08/23/22 1439 09/06/22 1637 09/20/22 1418     Exercise Comments Pt's first day in the CRP2 program. Pt had no complaints with todays session. Reviewed METs. Pt increased workload on nustep. Pt is making good progress. Reviewed METs and goals. Pt is progressing well. Reviewed home exercise Rx. Pt goes to [employer] 1-2x/week and does the treadmill for 30 minutes and weight machines. Pt encouraged to continue 2x/week in addition to the CRP2 program. Pt verblaized understanding of the home exercise Rx and was provided a copy.             Exercise Goals and Review:   Exercise Goals     Row Name 08/03/22 1258             Exercise Goals    Increase Physical Activity Yes       Intervention Provide advice, education, support and counseling about physical activity/exercise needs.;Develop an individualized exercise prescription for aerobic and resistive training based on initial evaluation findings, risk stratification, comorbidities and participant's personal goals.       Expected Outcomes Short Term: Attend rehab on a regular basis to increase amount of physical activity.;Long Term: Add in home exercise to make exercise part of routine and to increase amount of physical activity.;Long Term: Exercising regularly at least 3-5 days a week.       Increase Strength and Stamina Yes       Intervention Provide advice, education, support and counseling about physical activity/exercise needs.;Develop an individualized exercise prescription for aerobic and resistive training based on initial evaluation findings, risk stratification, comorbidities and participant's personal goals.       Expected Outcomes Short Term: Increase workloads from initial exercise prescription for resistance, speed, and METs.;Short Term: Perform resistance training exercises routinely during rehab and add in resistance training at home;Long Term: Improve cardiorespiratory fitness, muscular endurance and strength as measured by increased METs and functional capacity (6MWT)       Able to understand and use rate of perceived exertion (RPE) scale Yes  Intervention Provide education and explanation on how to use RPE scale       Expected Outcomes Short Term: Able to use RPE daily in rehab to express subjective intensity level;Long Term:  Able to use RPE to guide intensity level when exercising independently       Knowledge and understanding of Target Heart Rate Range (THRR) Yes       Intervention Provide education and explanation of THRR including how the numbers were predicted and where they are located for reference       Expected Outcomes Short Term: Able to state/look up  THRR;Short Term: Able to use daily as guideline for intensity in rehab;Long Term: Able to use THRR to govern intensity when exercising independently       Understanding of Exercise Prescription Yes       Intervention Provide education, explanation, and written materials on patient's individual exercise prescription       Expected Outcomes Short Term: Able to explain program exercise prescription;Long Term: Able to explain home exercise prescription to exercise independently                Exercise Goals Re-Evaluation :  Exercise Goals Re-Evaluation     Row Name 08/09/22 1434 09/06/22 1635           Exercise Goal Re-Evaluation   Exercise Goals Review Increase Physical Activity;Increase Strength and Stamina;Able to understand and use rate of perceived exertion (RPE) scale;Understanding of Exercise Prescription;Knowledge and understanding of Target Heart Rate Range (THRR) Increase Physical Activity;Increase Strength and Stamina;Able to understand and use rate of perceived exertion (RPE) scale;Understanding of Exercise Prescription;Knowledge and understanding of Target Heart Rate Range (THRR)      Comments Pt's first day in the CRP2 program. Pt understands the exercise RX, THRR and RPE scale. Reviewed METs and goals with patient today. Pt voices feeling stonger. Pt is walking at home and going to [employer] 1-2x/week.      Expected Outcomes Will continue to monitor patient and progress exericse workloads as tolerated. Will continue to monitor patient and progress exericse workloads as tolerated.               Discharge Exercise Prescription (Final Exercise Prescription Changes):  Exercise Prescription Changes - 09/20/22 1400       Response to Exercise   Blood Pressure (Admit) 110/72    Blood Pressure (Exercise) 132/80    Blood Pressure (Exit) 118/60    Heart Rate (Admit) 60 bpm    Heart Rate (Exercise) 108 bpm    Heart Rate (Exit) 58 bpm    Rating of Perceived Exertion  (Exercise) 11    Symptoms None    Comments Reviewed Home exercise Rx    Duration Continue with 30 min of aerobic exercise without signs/symptoms of physical distress.    Intensity THRR unchanged      Progression   Progression Continue to progress workloads to maintain intensity without signs/symptoms of physical distress.    Average METs 3.53      Resistance Training   Training Prescription No    Weight No weights on Wednesdays      Interval Training   Interval Training No      Bike   Level 3    Minutes 15    METs 5      NuStep   Level 3    SPM 93    Minutes 15    METs 2.1      Home Exercise Plan   Plans  to continue exercise at [employer] (comment)    Frequency Add 2 additional days to program exercise sessions.    Initial Home Exercises Provided 09/20/22             Nutrition:  Target Goals: Understanding of nutrition guidelines, daily intake of sodium 1500mg , cholesterol 200mg , calories 30% from fat and 7% or less from saturated fats, daily to have 5 or more servings of fruits and vegetables.  Biometrics:  Pre Biometrics - 08/03/22 0940       Pre Biometrics   Waist Circumference 40.75 inches    Hip Circumference 40.75 inches    Waist to Hip Ratio 1 %    Triceps Skinfold 12 mm    % Body Fat 25.6 %    Grip Strength 30 kg    Flexibility 0 in   Pt could not reach   Single Leg Stand 12.87 seconds              Nutrition Therapy Plan and Nutrition Goals:  Nutrition Therapy & Goals - 09/08/22 1443       Nutrition Therapy   Diet Heart Healthy Diet    Drug/Food Interactions Statins/Certain Fruits      Personal Nutrition Goals   Nutrition Goal Patient to identify strategies for reducing cardiovascular risk by attending the weekly [employer] education and nutrition series    Personal Goal #2 Patient to improve diet quality by using the plate method as a daily guide for meal planning to include lean protein/plant protein, fruits, vegetables, whole  grains, nonfat dairy as part of well balanced diet    Personal Goal #3 Patient to reduce sodium intake to 1500mg  per day    Comments Goals in action. Tarez continues to attend the [employer] and nutrition series regularly. He has started making many dietary changes including decreased saturated fat and using lower fat cooking methods and unsaturated fats. He has good understanding of the benefits of low sodium diet and high fiber intake. His wife is very supportive of dietary changes. Kaleel will continue to benefit from participation in intensive cardiac rehab for nutrition, exercise, and lifestyle modification.      Intervention Plan   Intervention Prescribe, educate and counsel regarding individualized specific dietary modifications aiming towards targeted core components such as weight, hypertension, lipid management, diabetes, heart failure and other comorbidities.;Nutrition handout(s) given to patient.    Expected Outcomes Short Term Goal: Understand basic principles of dietary content, such as calories, fat, sodium, cholesterol and nutrients.;Long Term Goal: Adherence to prescribed nutrition plan.             Nutrition Assessments:  Nutrition Assessments - 08/10/22 0836       Rate Your Plate Scores   Pre Score 71            MEDIFICTS Score Key: ?70 Need to make dietary changes  40-70 Heart Healthy Diet ? 40 Therapeutic Level Cholesterol Diet   Flowsheet Row INTENSIVE CARDIAC REHAB from 08/09/2022 in [facility], V[facility] & [facility]  Picture Your Plate Total Score on Admission 71      Picture Your Plate Scores: [id number] Unhealthy dietary pattern with much room for improvement. 41-50 Dietary pattern unlikely to meet recommendations for good health and room for improvement. 51-60 More healthful dietary pattern, with some room for improvement.  >60 Healthy dietary pattern, although there may be some specific behaviors that could be  improved.    Nutrition Goals Re-Evaluation:  Nutrition Goals Re-Evaluation  [place] Name 08/10/22 0826 09/08/22 1443           Goals   Current Weight 189 lb 2.5 oz (85.8 kg) 189 lb 13.1 oz (86.1 kg)      Comment HDL 37, A1c WNL No new labs at this time; most recent labs HDL 37, A1c WNL. Patient has maintained weight since starting with our program.      Expected Outcome Dayquon has started making some dietary changes such as switching to olive oil. Answered question regarding supplementing olive oil, fat intake, etc. Fred will continue to benefit from participation in intensive cardiac rehab for nutrition, exercise, and lifestyle modification. Goals in action. Shauntez continues to attend the [employer] and nutrition series regularly. He has started making many dietary changes including decreased saturated fat and using lower fat cooking methods and unsaturated fats. He has good understanding of the benefits of low sodium diet and high fiber intake. His wife is very supportive of dietary changes. Zacaria will continue to benefit from participation in intensive cardiac rehab for nutrition, exercise, and lifestyle modification.               Nutrition Goals Re-Evaluation:  Nutrition Goals Re-Evaluation     [place] Name 08/10/22 0826 09/08/22 1443           Goals   Current Weight 189 lb 2.5 oz (85.8 kg) 189 lb 13.1 oz (86.1 kg)      Comment HDL 37, A1c WNL No new labs at this time; most recent labs HDL 37, A1c WNL. Patient has maintained weight since starting with our program.      Expected Outcome Tiree has started making some dietary changes such as switching to olive oil. Answered question regarding supplementing olive oil, fat intake, etc. Hawk will continue to benefit from participation in intensive cardiac rehab for nutrition, exercise, and lifestyle modification. Goals in action. Damyan continues to attend the [employer] and nutrition series regularly. He has  started making many dietary changes including decreased saturated fat and using lower fat cooking methods and unsaturated fats. He has good understanding of the benefits of low sodium diet and high fiber intake. His wife is very supportive of dietary changes. Worley will continue to benefit from participation in intensive cardiac rehab for nutrition, exercise, and lifestyle modification.               Nutrition Goals Discharge (Final Nutrition Goals Re-Evaluation):  Nutrition Goals Re-Evaluation - 09/08/22 1443       Goals   Current Weight 189 lb 13.1 oz (86.1 kg)    Comment No new labs at this time; most recent labs HDL 37, A1c WNL. Patient has maintained weight since starting with our program.    Expected Outcome Goals in action. Saman continues to attend the [employer] and nutrition series regularly. He has started making many dietary changes including decreased saturated fat and using lower fat cooking methods and unsaturated fats. He has good understanding of the benefits of low sodium diet and high fiber intake. His wife is very supportive of dietary changes. Yunior will continue to benefit from participation in intensive cardiac rehab for nutrition, exercise, and lifestyle modification.             Psychosocial: Target Goals: Acknowledge presence or absence of significant depression and/or stress, maximize coping skills, provide positive support system. Participant is able to verbalize types and ability to use techniques and skills needed for reducing stress and depression.  Initial Review &  Psychosocial Screening:  Initial Psych Review & Screening - 08/03/22 1456       Initial Review   Current issues with Current Stress Concerns    Source of Stress Concerns Family;Unable to perform yard/household activities;Unable to participate in former interests or hobbies;Chronic Illness    Comments Richardson Landry says he has been experiencing shortness of breath and chest tightness  which limits his activities      [place]? Yes   Richardson Landry has his wife for support [place] has neighbors but no close friends     Barriers   Psychosocial barriers to participate in program There are no identifiable barriers or psychosocial needs.      Screening Interventions   Interventions Encouraged to exercise    Expected Outcomes Long Term Goal: Stressors or current issues are controlled or eliminated.;Short Term goal: Identification and review with participant of any Quality of Life or Depression concerns found by scoring the questionnaire.;Long Term goal: The participant improves quality of Life and PHQ9 Scores as seen by post scores and/or verbalization of changes             Quality of Life Scores:  Quality of Life - 08/03/22 1302       Quality of Life   Select Quality of Life      Quality of Life Scores   Health/Function Pre 16.33 %    Socioeconomic Pre 24.33 %    Psych/Spiritual Pre 20 %    Family Pre 17.25 %    GLOBAL Pre 18.75 %            Scores of 19 and below usually indicate a poorer quality of life in these areas.  A difference of  2-3 points is a clinically meaningful difference.  A difference of 2-3 points in the total score of the Quality of Life Index has been associated with significant improvement in overall quality of life, self-image, physical symptoms, and general health in studies assessing change in quality of life.  PHQ-9: Review Flowsheet       08/03/2022  Depression screen PHQ 2/9  Decreased Interest 0  Down, Depressed, Hopeless 0  PHQ - 2 Score 0  Altered sleeping 0  Tired, decreased energy 1  Change in appetite 1  Feeling bad or failure about yourself  0  Trouble concentrating 0  Moving slowly or fidgety/restless 0  Suicidal thoughts 0  PHQ-9 Score 2  Difficult doing work/chores Not difficult at all   Interpretation of Total Score  Total Score Depression Severity:  1-4 =  Minimal depression, 5-9 = Mild depression, 10-14 = Moderate depression, 15-19 = Moderately severe depression, 20-27 = Severe depression   Psychosocial Evaluation and Intervention:   Psychosocial Re-Evaluation:  Psychosocial Re-Evaluation     Row Name 08/10/22 1509 08/29/22 0109 09/20/22 1703         Psychosocial Re-Evaluation   Current issues with Current Stress Concerns Current Stress Concerns Current Stress Concerns     Comments Richardson Landry did not voice any increased concerns or stressors on his first day of exercise. Will review quality of life survey in the upcoing week Richardson Landry has not voiced any  increased concerns or stressors at intensive cardiac rehab. . Quality of life survey reviewed and forwarded to Dr Brigitte Pulse, primary care provider. Richardson Landry has not voiced any  increased concerns or stressors at intensive cardiac rehab.     Expected Outcomes Richardson Landry will havw controlled or decreased stress upon  completion of intensive cardiac rehab. Richardson Landry will have controlled or decreased stress upon completion of intensive cardiac rehab. Richardson Landry will have controlled or decreased stress upon completion of intensive cardiac rehab.     Interventions Stress management education;Encouraged to attend Cardiac Rehabilitation for the exercise;Relaxation education Stress management education;Encouraged to attend Cardiac Rehabilitation for the exercise;Relaxation education Stress management education;Encouraged to attend Cardiac Rehabilitation for the exercise;Relaxation education     Continue Psychosocial Services  Follow up required by staff Follow up required by staff No Follow up required       Initial Review   Source of Stress Concerns Chronic Illness;Unable to participate in former interests or hobbies;Unable to perform yard/household activities Chronic Illness;Unable to participate in former interests or hobbies;Unable to perform yard/household activities Chronic Illness;Unable to participate in former interests or  hobbies;Unable to perform yard/household activities     Comments Will continue to monitor and offer support as needed Will continue to monitor and offer support as needed Will continue to monitor and offer support as needed              Psychosocial Discharge (Final Psychosocial Re-Evaluation):  Psychosocial Re-Evaluation - 09/20/22 1703       Psychosocial Re-Evaluation   Current issues with Current Stress Concerns    Comments Richardson Landry has not voiced any  increased concerns or stressors at intensive cardiac rehab.    Expected Outcomes Richardson Landry will have controlled or decreased stress upon completion of intensive cardiac rehab.    Interventions Stress management education;Encouraged to attend Cardiac Rehabilitation for the exercise;Relaxation education    Continue Psychosocial Services  No Follow up required      Initial Review   Source of Stress Concerns Chronic Illness;Unable to participate in former interests or hobbies;Unable to perform yard/household activities    Comments Will continue to monitor and offer support as needed             Vocational Rehabilitation: Provide vocational rehab assistance to qualifying candidates.   Vocational Rehab Evaluation & Intervention:  Vocational Rehab - 08/03/22 1459       Initial Vocational Rehab Evaluation & Intervention   Assessment shows need for Vocational Rehabilitation No   Richardson Landry is retired and does not need vocational rehab at this time            Education: Education Goals: Education classes will be provided on a weekly basis, covering required topics. Participant will state understanding/return demonstration of topics presented.    Education     Row Name 08/09/22 1500     Education   Cardiac Education Topics [employer]   Educator Dietitian   Weekly Topic One-Pot Wonders   Instruction Review Code 1- Verbalizes Understanding   Class Start Time 1400   Class Stop Time 1450    Class Time Calculation (min) 50 min    [place] Name 08/11/22 1600     Education   Cardiac Education Topics Pritikin   Academic librarian General Education   General Education Hypertension and Heart Disease   Instruction Review Code 1- Verbalizes Understanding   Class Start Time 1410   Class Stop Time 1450   Class Time Calculation (min) 40 min    [place] Name 08/14/22 1500     Education   Cardiac Education Topics Pritikin   Financial planner Exercise  Physiologist   Select Psychosocial   Psychosocial Workshop Healthy Sleep for a Healthy Heart   Instruction Review Code 1- Verbalizes Understanding   Class Start Time 1400   Class Stop Time 1450   Class Time Calculation (min) 50 min    [place] Name 08/16/22 1500     Education   Cardiac Education Topics Pritikin   Financial trader   Weekly Topic Comforting Weekend Breakfasts   Instruction Review Code 1- Verbalizes Understanding   Class Start Time 1400   Class Stop Time 1445   Class Time Calculation (min) 32 min    [place] Name 08/18/22 1500     Education   Cardiac Education Topics Pritikin   Lexicographer Nutrition   Nutrition Dining Out - Part 1   Instruction Review Code 1- Verbalizes Understanding   Class Start Time 1405   Class Stop Time 1440   Class Time Calculation (min) 35 min    [place] Name 08/21/22 1500     Education   Cardiac Education Topics Pritikin   Academic librarian Exercise Education   Exercise Education Biomechanial Limitations   Instruction Review Code 1- Verbalizes Understanding   Class Start Time 1406   Class Stop Time 1442   Class Time Calculation (min) 36 min    [place] Name 08/23/22 1600     Education   Cardiac Education Topics Pritikin   Sales executive   Weekly Topic Fast Evening Meals   Instruction Review Code 1- Verbalizes Understanding   Class Start Time 1400   Class Stop Time 1445   Class Time Calculation (min) 45 min    [place] Name 08/28/22 1500     Education   Cardiac Education Topics Pritikin   Financial trader   Weekly Topic International Cuisine- Spotlight on the [place]   Instruction Review Code 1- Verbalizes Understanding   Class Start Time 1405   Class Stop Time 1453   Class Time Calculation (min) 48 min    [place] Name 08/30/22 1600     Education   Cardiac Education Topics Pritikin   Lexicographer Nutrition   Nutrition Facts on Fat   Instruction Review Code 1- Verbalizes Understanding   Class Start Time 1400   Class Stop Time 1450   Class Time Calculation (min) 50 min    [place] Name 09/04/22 1500     Education   Cardiac Education Topics Pritikin   Environmental consultant Psychosocial   Psychosocial Workshop Other  From Head to Heart   Instruction Review Code 1- Verbalizes Understanding   Class Start Time 1400   Class Stop Time 1452   Class Time Calculation (min) 52 min    [place] Name 09/06/22 1600     Education   Cardiac Education Topics Pritikin   Financial trader   Weekly Topic Adding Flavor - Sodium-Free   Instruction Review Code 1- Verbalizes Understanding   Class Start Time 1400  Class Stop Time 1453   Class Time Calculation (min) 53 min    [place] Name 09/08/22 1600     Education   Cardiac Education Topics Pritikin   Architect Education   General Education Heart Disease Risk Reduction   Instruction Review Code 1- Verbalizes Understanding   Class Start Time 1355   Class Stop Time 1440    Class Time Calculation (min) 45 min    [place] Name 09/11/22 1500     Education   Cardiac Education Topics Pritikin   Environmental consultant Exercise   Exercise Workshop Hotel manager and Fall Prevention   Instruction Review Code 1- Verbalizes Understanding   Class Start Time 1350   Class Stop Time 1432   Class Time Calculation (min) 42 min    [place] Name 09/13/22 1500     Education   Cardiac Education Topics Pritikin   Financial trader   Weekly Topic Fast and Healthy Breakfasts   Instruction Review Code 1- Verbalizes Understanding   Class Start Time 1355   Class Stop Time 1450   Class Time Calculation (min) 55 min    [place] Name 09/15/22 1600     Education   Cardiac Education Topics Pritikin   Lexicographer Nutrition   Nutrition Overview of the [place]   Instruction Review Code 1- Verbalizes Understanding   Class Start Time 1400   Class Stop Time 1436   Class Time Calculation (min) 36 min    [place] Name 09/18/22 1700     Education   Cardiac Education Topics Pritikin   [employer]     Workshops   Educator Exercise Physiologist   Select Psychosocial   Psychosocial Workshop Recognizing and Reducing Stress   Instruction Review Code 1- Verbalizes Understanding   Class Start Time 1405   Class Stop Time 1454   Class Time Calculation (min) 49 min    [place] Name 09/20/22 1600     Education   Cardiac Education Topics Pritikin   Financial trader   Weekly Topic Personalizing Your Pritikin Plate   Instruction Review Code 1- Verbalizes Understanding   Class Start Time 1400   Class Stop Time 1448   Class Time Calculation (min) 48 min            Core Videos: Exercise    Move It!  Clinical staff conducted group or individual video education with verbal  and written material and guidebook.  Patient learns the recommended Pritikin exercise program. Exercise with the goal of living a long, healthy life. Some of the health benefits of exercise include controlled diabetes, healthier blood pressure levels, improved cholesterol levels, improved heart and lung capacity, improved sleep, and better body composition. Everyone should speak with their doctor before starting or changing an exercise routine.  Biomechanical Limitations Clinical staff conducted group or individual video education with verbal and written material and guidebook.  Patient learns how biomechanical limitations can impact exercise and how we can mitigate and possibly overcome limitations to have an impactful and balanced exercise routine.  Body Composition Clinical staff conducted group or individual video education with verbal and written material  and guidebook.  Patient learns that body composition (ratio of muscle mass to fat mass) is a key component to assessing overall fitness, rather than body weight alone. Increased fat mass, especially visceral belly fat, can put [place] at increased risk for metabolic syndrome, type 2 diabetes, heart disease, and even death. It is recommended to combine diet and exercise (cardiovascular and resistance training) to improve your body composition. Seek guidance from your physician and exercise physiologist before implementing an exercise routine.  Exercise Action Plan Clinical staff conducted group or individual video education with verbal and written material and guidebook.  Patient learns the recommended strategies to achieve and enjoy long-term exercise adherence, including variety, self-motivation, self-efficacy, and positive decision making. Benefits of exercise include fitness, good health, weight management, more energy, better sleep, less stress, and overall well-being.  Medical   Heart Disease Risk Reduction Clinical staff conducted group or  individual video education with verbal and written material and guidebook.  Patient learns our heart is our most vital organ as it circulates oxygen, nutrients, white blood cells, and hormones throughout the entire body, and carries waste away. Data supports a plant-based eating plan like the Pritikin Program for its effectiveness in slowing progression of and reversing heart disease. The video provides a number of recommendations to address heart disease.   Metabolic Syndrome and Belly Fat  Clinical staff conducted group or individual video education with verbal and written material and guidebook.  Patient learns what metabolic syndrome is, how it leads to heart disease, and how one can reverse it and keep it from coming back. You have metabolic syndrome if you have 3 of the following 5 criteria: abdominal obesity, high blood pressure, high triglycerides, low HDL cholesterol, and high blood sugar.  Hypertension and Heart Disease Clinical staff conducted group or individual video education with verbal and written material and guidebook.  Patient learns that high blood pressure, or hypertension, is very common in the [place]. Hypertension is largely due to excessive salt intake, but other important risk factors include being overweight, physical inactivity, drinking too much alcohol, smoking, and not eating enough potassium from fruits and vegetables. High blood pressure is a leading risk factor for heart attack, stroke, congestive heart failure, dementia, kidney failure, and premature death. Long-term effects of excessive salt intake include stiffening of the arteries and thickening of heart muscle and organ damage. Recommendations include ways to reduce hypertension and the risk of heart disease.  Diseases of Our Time - Focusing on Diabetes Clinical staff conducted group or individual video education with verbal and written material and guidebook.  Patient learns why the best way to stop diseases  of our time is prevention, through food and other lifestyle changes. Medicine (such as prescription pills and surgeries) is often only a Band-Aid on the problem, not a long-term solution. Most common diseases of our time include obesity, type 2 diabetes, hypertension, heart disease, and cancer. The Pritikin Program is recommended and has been proven to help reduce, reverse, and/or prevent the damaging effects of metabolic syndrome.  Nutrition   Overview of the Pritikin Eating Plan  Clinical staff conducted group or individual video education with verbal and written material and guidebook.  Patient learns about the [place] for disease risk reduction. The [place] emphasizes a wide variety of unrefined, minimally-processed carbohydrates, like fruits, vegetables, whole grains, and legumes. Go, Caution, and Stop food choices are explained. Plant-based and lean animal proteins are emphasized. Rationale provided for low sodium intake for  blood pressure control, low added sugars for blood sugar stabilization, and low added fats and oils for coronary artery disease risk reduction and weight management.  Calorie Density  Clinical staff conducted group or individual video education with verbal and written material and guidebook.  Patient learns about calorie density and how it impacts the Pritikin Eating Plan. Knowing the characteristics of the food you choose will help you decide whether those foods will lead to weight gain or weight loss, and whether you want to consume more or less of them. Weight loss is usually a side effect of the Pritikin Eating Plan because of its focus on low calorie-dense foods.  Label Reading  Clinical staff conducted group or individual video education with verbal and written material and guidebook.  Patient learns about the Pritikin recommended label reading guidelines and corresponding recommendations regarding calorie density, added sugars, sodium content,  and whole grains.  Dining Out - Part 1  Clinical staff conducted group or individual video education with verbal and written material and guidebook.  Patient learns that restaurant meals can be sabotaging because they can be so high in calories, fat, sodium, and/or sugar. Patient learns recommended strategies on how to positively address this and avoid unhealthy pitfalls.  Facts on Fats  Clinical staff conducted group or individual video education with verbal and written material and guidebook.  Patient learns that lifestyle modifications can be just as effective, if not more so, as many medications for lowering your risk of heart disease. A Pritikin lifestyle can help to reduce your risk of inflammation and atherosclerosis (cholesterol build-up, or plaque, in the artery walls). Lifestyle interventions such as dietary choices and physical activity address the cause of atherosclerosis. A review of the types of fats and their impact on blood cholesterol levels, along with dietary recommendations to reduce fat intake is also included.  Nutrition Action Plan  Clinical staff conducted group or individual video education with verbal and written material and guidebook.  Patient learns how to incorporate Pritikin recommendations into their lifestyle. Recommendations include planning and keeping personal health goals in mind as an important part of their success.  Healthy Mind-Set    Healthy Minds, Bodies, Hearts  Clinical staff conducted group or individual video education with verbal and written material and guidebook.  Patient learns how to identify when they are stressed. Video will discuss the impact of that stress, as well as the many benefits of stress management. Patient will also be introduced to stress management techniques. The way we think, act, and feel has an impact on our hearts.  How Our Thoughts Can Heal Our Hearts  Clinical staff conducted group or individual video education with verbal  and written material and guidebook.  Patient learns that negative thoughts can cause depression and anxiety. This can result in negative lifestyle behavior and serious health problems. Cognitive behavioral therapy is an effective method to help control our thoughts in order to change and improve our emotional outlook.  Additional Videos:  Exercise    Improving Performance  Clinical staff conducted group or individual video education with verbal and written material and guidebook.  Patient learns to use a non-linear approach by alternating intensity levels and lengths of time spent exercising to help burn more calories and lose more body fat. Cardiovascular exercise helps improve heart health, metabolism, hormonal balance, blood sugar control, and recovery from fatigue. Resistance training improves strength, endurance, balance, coordination, reaction time, metabolism, and muscle mass. Flexibility exercise improves circulation, posture, and balance. Seek guidance  from your physician and exercise physiologist before implementing an exercise routine and learn your capabilities and proper form for all exercise.  Introduction to Yoga  Clinical staff conducted group or individual video education with verbal and written material and guidebook.  Patient learns about yoga, a discipline of the coming together of mind, breath, and body. The benefits of yoga include improved flexibility, improved range of motion, better posture and core strength, increased lung function, weight loss, and positive self-image. Yoga's heart health benefits include lowered blood pressure, healthier heart rate, decreased cholesterol and triglyceride levels, improved immune function, and reduced stress. Seek guidance from your physician and exercise physiologist before implementing an exercise routine and learn your capabilities and proper form for all exercise.  Medical   Aging: Enhancing Your Quality of Life  Clinical staff conducted  group or individual video education with verbal and written material and guidebook.  Patient learns key strategies and recommendations to stay in good physical health and enhance quality of life, such as prevention strategies, having an advocate, securing a [place], and keeping a list of medications and system for tracking them. It also discusses how to avoid risk for bone loss.  Biology of Weight Control  Clinical staff conducted group or individual video education with verbal and written material and guidebook.  Patient learns that weight gain occurs because we consume more calories than we burn (eating more, moving less). Even if your body weight is normal, you may have higher ratios of fat compared to muscle mass. Too much body fat puts you at increased risk for cardiovascular disease, heart attack, stroke, type 2 diabetes, and obesity-related cancers. In addition to exercise, following the [place] can help reduce your risk.  Decoding Lab Results  Clinical staff conducted group or individual video education with verbal and written material and guidebook.  Patient learns that lab test reflects one measurement whose values change over time and are influenced by many factors, including medication, stress, sleep, exercise, food, hydration, pre-existing medical conditions, and more. It is recommended to use the knowledge from this video to become more involved with your lab results and evaluate your numbers to speak with your doctor.   Diseases of Our Time - Overview  Clinical staff conducted group or individual video education with verbal and written material and guidebook.  Patient learns that according to the CDC, 50% to 70% of chronic diseases (such as obesity, type 2 diabetes, elevated lipids, hypertension, and heart disease) are avoidable through lifestyle improvements including healthier food choices, listening to satiety cues, and increased physical  activity.  Sleep Disorders Clinical staff conducted group or individual video education with verbal and written material and guidebook.  Patient learns how good quality and duration of sleep are important to overall health and well-being. Patient also learns about sleep disorders and how they impact health along with recommendations to address them, including discussing with a physician.  Nutrition  Dining Out - Part 2 Clinical staff conducted group or individual video education with verbal and written material and guidebook.  Patient learns how to plan ahead and communicate in order to maximize their dining experience in a healthy and nutritious manner. Included are recommended food choices based on the type of restaurant the patient is visiting.   Fueling a Best boy conducted group or individual video education with verbal and written material and guidebook.  There is a strong connection between our food choices and our health.  Diseases like obesity and type 2 diabetes are very prevalent and are in large-part due to lifestyle choices. The Pritikin Eating Plan provides plenty of food and hunger-curbing satisfaction. It is easy to follow, affordable, and helps reduce health risks.  Menu Workshop  Clinical staff conducted group or individual video education with verbal and written material and guidebook.  Patient learns that restaurant meals can sabotage health goals because they are often packed with calories, fat, sodium, and sugar. Recommendations include strategies to plan ahead and to communicate with the manager, chef, or server to help order a healthier meal.  Planning Your Eating Strategy  Clinical staff conducted group or individual video education with verbal and written material and guidebook.  Patient learns about the [place] and its benefit of reducing the risk of disease. The [place] does not focus on calories. Instead, it emphasizes  high-quality, nutrient-rich foods. By knowing the characteristics of the foods, we choose, we can determine their calorie density and make informed decisions.  Targeting Your Nutrition Priorities  Clinical staff conducted group or individual video education with verbal and written material and guidebook.  Patient learns that lifestyle habits have a tremendous impact on disease risk and progression. This video provides eating and physical activity recommendations based on your personal health goals, such as reducing LDL cholesterol, losing weight, preventing or controlling type 2 diabetes, and reducing high blood pressure.  Vitamins and Minerals  Clinical staff conducted group or individual video education with verbal and written material and guidebook.  Patient learns different ways to obtain key vitamins and minerals, including through a recommended healthy diet. It is important to discuss all supplements you take with your doctor.   Healthy Mind-Set    Smoking Cessation  Clinical staff conducted group or individual video education with verbal and written material and guidebook.  Patient learns that cigarette smoking and tobacco addiction pose a serious health risk which affects millions of people. Stopping smoking will significantly reduce the risk of heart disease, lung disease, and many forms of cancer. Recommended strategies for quitting are covered, including working with your doctor to develop a successful plan.  Culinary   Becoming a Financial trader conducted group or individual video education with verbal and written material and guidebook.  Patient learns that cooking at home can be healthy, cost-effective, quick, and puts them in control. Keys to cooking healthy recipes will include looking at your recipe, assessing your equipment needs, planning ahead, making it simple, choosing cost-effective seasonal ingredients, and limiting the use of added fats, salts, and  sugars.  Cooking - Breakfast and Snacks  Clinical staff conducted group or individual video education with verbal and written material and guidebook.  Patient learns how important breakfast is to satiety and nutrition through the entire day. Recommendations include key foods to eat during breakfast to help stabilize blood sugar levels and to prevent overeating at meals later in the day. Planning ahead is also a key component.  Cooking - Human resources officer conducted group or individual video education with verbal and written material and guidebook.  Patient learns eating strategies to improve overall health, including an approach to cook more at home. Recommendations include thinking of animal protein as a side on your plate rather than center stage and focusing instead on lower calorie dense options like vegetables, fruits, whole grains, and plant-based proteins, such as beans. Making sauces in large quantities to freeze for later and leaving the skin on  your vegetables are also recommended to maximize your experience.  Cooking - Healthy Salads and Dressing Clinical staff conducted group or individual video education with verbal and written material and guidebook.  Patient learns that vegetables, fruits, whole grains, and legumes are the foundations of the [place]. Recommendations include how to incorporate each of these in flavorful and healthy salads, and how to create homemade salad dressings. Proper handling of ingredients is also covered. Cooking - Soups and [employer] - Soups and Desserts Clinical staff conducted group or individual video education with verbal and written material and guidebook.  Patient learns that Pritikin soups and desserts make for easy, nutritious, and delicious snacks and meal components that are low in sodium, fat, sugar, and calorie density, while high in vitamins, minerals, and filling fiber. Recommendations include simple and healthy  ideas for soups and desserts.   Overview     The Pritikin Solution Program Overview Clinical staff conducted group or individual video education with verbal and written material and guidebook.  Patient learns that the results of the [place] Program have been documented in more than 100 articles published in peer-reviewed journals, and the benefits include reducing risk factors for (and, in some cases, even reversing) high cholesterol, high blood pressure, type 2 diabetes, obesity, and more! An overview of the three key pillars of the Pritikin Program will be covered: eating well, doing regular exercise, and having a healthy mind-set.  WORKSHOPS  Exercise: Exercise Basics: Building Your Action Plan Clinical staff led group instruction and group discussion with PowerPoint presentation and patient guidebook. To enhance the learning environment the use of posters, models and videos may be added. At the conclusion of this workshop, patients will comprehend the difference between physical activity and exercise, as well as the benefits of incorporating both, into their routine. Patients will understand the FITT (Frequency, Intensity, Time, and Type) principle and how to use it to build an exercise action plan. In addition, safety concerns and other considerations for exercise and cardiac rehab will be addressed by the presenter. The purpose of this lesson is to promote a comprehensive and effective weekly exercise routine in order to improve patients' overall level of fitness.   Managing Heart Disease: Your Path to a Healthier Heart Clinical staff led group instruction and group discussion with PowerPoint presentation and patient guidebook. To enhance the learning environment the use of posters, models and videos may be added.At the conclusion of this workshop, patients will understand the anatomy and physiology of the heart. Additionally, they will understand how Pritikin's three pillars impact the  risk factors, the progression, and the management of heart disease.  The purpose of this lesson is to provide a high-level overview of the heart, heart disease, and how the Pritikin lifestyle positively impacts risk factors.  Exercise Biomechanics Clinical staff led group instruction and group discussion with PowerPoint presentation and patient guidebook. To enhance the learning environment the use of posters, models and videos may be added. Patients will learn how the structural parts of their bodies function and how these functions impact their daily activities, movement, and exercise. Patients will learn how to promote a neutral spine, learn how to manage pain, and identify ways to improve their physical movement in order to promote healthy living. The purpose of this lesson is to expose patients to common physical limitations that impact physical activity. Participants will learn practical ways to adapt and manage aches and pains, and to minimize their effect on regular exercise. Patients will  learn how to maintain good posture while sitting, walking, and lifting.  Balance Training and Fall Prevention  Clinical staff led group instruction and group discussion with PowerPoint presentation and patient guidebook. To enhance the learning environment the use of posters, models and videos may be added. At the conclusion of this workshop, patients will understand the importance of their sensorimotor skills (vision, proprioception, and the vestibular system) in maintaining their ability to balance as they age. Patients will apply a variety of balancing exercises that are appropriate for their current level of function. Patients will understand the common causes for poor balance, possible solutions to these problems, and ways to modify their physical environment in order to minimize their fall risk. The purpose of this lesson is to teach patients about the importance of maintaining balance as they age  and ways to minimize their risk of falling.  WORKSHOPS   Nutrition:  Fueling a Scientist, research (physical sciences) led group instruction and group discussion with PowerPoint presentation and patient guidebook. To enhance the learning environment the use of posters, models and videos may be added. Patients will review the foundational principles of the [place] and understand what constitutes a serving size in each of the food groups. Patients will also learn Pritikin-friendly foods that are better choices when away from home and review make-ahead meal and snack options. Calorie density will be reviewed and applied to three nutrition priorities: weight maintenance, weight loss, and weight gain. The purpose of this lesson is to reinforce (in a group setting) the key concepts around what patients are recommended to eat and how to apply these guidelines when away from home by planning and selecting Pritikin-friendly options. Patients will understand how calorie density may be adjusted for different weight management goals.  Mindful Eating  Clinical staff led group instruction and group discussion with PowerPoint presentation and patient guidebook. To enhance the learning environment the use of posters, models and videos may be added. Patients will briefly review the concepts of the [place] and the importance of low-calorie dense foods. The concept of mindful eating will be introduced as well as the importance of paying attention to internal hunger signals. Triggers for non-hunger eating and techniques for dealing with triggers will be explored. The purpose of this lesson is to provide patients with the opportunity to review the basic principles of the [place], discuss the value of eating mindfully and how to measure internal cues of hunger and fullness using the Hunger Scale. Patients will also discuss reasons for non-hunger eating and learn strategies to use for controlling  emotional eating.  Targeting Your Nutrition Priorities Clinical staff led group instruction and group discussion with PowerPoint presentation and patient guidebook. To enhance the learning environment the use of posters, models and videos may be added. Patients will learn how to determine their genetic susceptibility to disease by reviewing their family history. Patients will gain insight into the importance of diet as part of an overall healthy lifestyle in mitigating the impact of genetics and other environmental insults. The purpose of this lesson is to provide patients with the opportunity to assess their personal nutrition priorities by looking at their family history, their own health history and current risk factors. Patients will also be able to discuss ways of prioritizing and modifying the [place] for their highest risk areas  Menu  Clinical staff led group instruction and group discussion with PowerPoint presentation and patient guidebook. To enhance the learning environment the use  of posters, models and videos may be added. Using menus brought in from [employer], or printed from [employer], patients will apply the [place] dining out guidelines that were presented in the [employer] video. Patients will also be able to practice these guidelines in a variety of provided scenarios. The purpose of this lesson is to provide patients with the opportunity to practice hands-on learning of the [place] with actual menus and practice scenarios.  Label Reading Clinical staff led group instruction and group discussion with PowerPoint presentation and patient guidebook. To enhance the learning environment the use of posters, models and videos may be added. Patients will review and discuss the Pritikin label reading guidelines presented in Pritikin's Label Reading Educational series video. Using fool labels brought in from local grocery stores  and markets, patients will apply the label reading guidelines and determine if the packaged food meet the Pritikin guidelines. The purpose of this lesson is to provide patients with the opportunity to review, discuss, and practice hands-on learning of the Pritikin Label Reading guidelines with actual packaged food labels. [place] Workshops are designed to teach patients ways to prepare quick, simple, and affordable recipes at home. The importance of nutrition's role in chronic disease risk reduction is reflected in its emphasis in the overall Pritikin program. By learning how to prepare essential core Pritikin Eating Plan recipes, patients will increase control over what they eat; be able to customize the flavor of foods without the use of added salt, sugar, or fat; and improve the quality of the food they consume. By learning a set of core recipes which are easily assembled, quickly prepared, and affordable, patients are more likely to prepare more healthy foods at home. These workshops focus on convenient breakfasts, simple entres, side dishes, and desserts which can be prepared with minimal effort and are consistent with nutrition recommendations for cardiovascular risk reduction. Cooking [employer] are taught by a Engineer, materials (RD) who has been trained by the [employer]. The chef or RD has a clear understanding of the importance of minimizing - if not completely eliminating - added fat, sugar, and sodium in recipes. Throughout the series of [place] Workshop sessions, patients will learn about healthy ingredients and efficient methods of cooking to build confidence in their capability to prepare    Cooking School weekly topics:  Adding Flavor- Sodium-Free  Fast and Healthy Breakfasts  Powerhouse Plant-Based Proteins  Satisfying Salads and Dressings  Simple Sides and Sauces  International Cuisine-Spotlight on the [place]  Zones  Delicious Desserts  Savory Soups  [employer] - Meals in a Agricultural consultant Appetizers and Snacks  Comforting Weekend Breakfasts  One-Pot Wonders   Fast Evening Meals  Contractor Your Pritikin Plate  WORKSHOPS   Healthy Mindset (Psychosocial):  Focused Goals, Sustainable Changes Clinical staff led group instruction and group discussion with PowerPoint presentation and patient guidebook. To enhance the learning environment the use of posters, models and videos may be added. Patients will be able to apply effective goal setting strategies to establish at least one personal goal, and then take consistent, meaningful action toward that goal. They will learn to identify common barriers to achieving personal goals and develop strategies to overcome them. Patients will also gain an understanding of how our mind-set can impact our ability to achieve goals and the importance of cultivating a positive and growth-oriented mind-set. The purpose of this lesson  is to provide patients with a deeper understanding of how to set and achieve personal goals, as well as the tools and strategies needed to overcome common obstacles which may arise along the way.  From Head to Heart: The Power of a Healthy Outlook  Clinical staff led group instruction and group discussion with PowerPoint presentation and patient guidebook. To enhance the learning environment the use of posters, models and videos may be added. Patients will be able to recognize and describe the impact of emotions and mood on physical health. They will discover the importance of self-care and explore self-care practices which may work for them. Patients will also learn how to utilize the 4 C's to cultivate a healthier outlook and better manage stress and challenges. The purpose of this lesson is to demonstrate to patients how a healthy outlook is an essential part of maintaining good health, especially as they continue their  cardiac rehab journey.  Healthy Sleep for a Healthy Heart Clinical staff led group instruction and group discussion with PowerPoint presentation and patient guidebook. To enhance the learning environment the use of posters, models and videos may be added. At the conclusion of this workshop, patients will be able to demonstrate knowledge of the importance of sleep to overall health, well-being, and quality of life. They will understand the symptoms of, and treatments for, common sleep disorders. Patients will also be able to identify daytime and nighttime behaviors which impact sleep, and they will be able to apply these tools to help manage sleep-related challenges. The purpose of this lesson is to provide patients with a general overview of sleep and outline the importance of quality sleep. Patients will learn about a few of the most common sleep disorders. Patients will also be introduced to the concept of "sleep hygiene," and discover ways to self-manage certain sleeping problems through simple daily behavior changes. Finally, the workshop will motivate patients by clarifying the links between quality sleep and their goals of heart-healthy living.   Recognizing and Reducing Stress Clinical staff led group instruction and group discussion with PowerPoint presentation and patient guidebook. To enhance the learning environment the use of posters, models and videos may be added. At the conclusion of this workshop, patients will be able to understand the types of stress reactions, differentiate between acute and chronic stress, and recognize the impact that chronic stress has on their health. They will also be able to apply different coping mechanisms, such as reframing negative self-talk. Patients will have the opportunity to practice a variety of stress management techniques, such as deep abdominal breathing, progressive muscle relaxation, and/or guided imagery.  The purpose of this lesson is to educate  patients on the role of stress in their lives and to provide healthy techniques for coping with it.  Learning Barriers/Preferences:  Learning Barriers/Preferences - 08/03/22 1303       Learning Barriers/Preferences   Learning Barriers Sight   wears glasses   Learning Preferences Written Material;Computer/Internet;Pictoral;Video             Education Topics:  Knowledge Questionnaire Score:  Knowledge Questionnaire Score - 08/03/22 1302       Knowledge Questionnaire Score   Pre Score 17/24             Core Components/Risk Factors/Patient Goals at Admission:  Personal Goals and Risk Factors at Admission - 08/03/22 1302       Core Components/Risk Factors/Patient Goals on Admission    Weight Management Yes    Intervention Weight Management: Develop  a combined nutrition and exercise program designed to reach desired caloric intake, while maintaining appropriate intake of nutrient and fiber, sodium and fats, and appropriate energy expenditure required for the weight goal.;Weight Management: Provide education and appropriate resources to help participant work on and attain dietary goals.    Expected Outcomes Short Term: Continue to assess and modify interventions until short term weight is achieved;Long Term: Adherence to nutrition and physical activity/exercise program aimed toward attainment of established weight goal;Understanding recommendations for meals to include 15-35% energy as protein, 25-35% energy from fat, 35-60% energy from carbohydrates, less than 200mg  of dietary cholesterol, 20-35 gm of total fiber daily    Hypertension Yes    Intervention Provide education on lifestyle modifcations including regular physical activity/exercise, weight management, moderate sodium restriction and increased consumption of fresh fruit, vegetables, and low fat dairy, alcohol moderation, and smoking cessation.;Monitor prescription use compliance.    Expected Outcomes Short Term: Continued  assessment and intervention until BP is < 140/57mm HG in hypertensive participants. < 130/70mm HG in hypertensive participants with diabetes, heart failure or chronic kidney disease.;Long Term: Maintenance of blood pressure at goal levels.    Lipids Yes    Intervention Provide education and support for participant on nutrition & aerobic/resistive exercise along with prescribed medications to achieve LDL 70mg , HDL >40mg .    Expected Outcomes Short Term: Participant states understanding of desired cholesterol values and is compliant with medications prescribed. Participant is following exercise prescription and nutrition guidelines.;Long Term: Cholesterol controlled with medications as prescribed, with individualized exercise RX and with personalized nutrition plan. Value goals: LDL < 70mg , HDL > 40 mg.    Stress Yes    Intervention Offer individual and/or small group education and counseling on adjustment to heart disease, stress management and health-related lifestyle change. Teach and support self-help strategies.;Refer participants experiencing significant psychosocial distress to appropriate mental health specialists for further evaluation and treatment. When possible, include family members and significant others in education/counseling sessions.    Expected Outcomes Short Term: Participant demonstrates changes in health-related behavior, relaxation and other stress management skills, ability to obtain effective social support, and compliance with psychotropic medications if prescribed.;Long Term: Emotional wellbeing is indicated by absence of clinically significant psychosocial distress or social isolation.             Core Components/Risk Factors/Patient Goals Review:   Goals and Risk Factor Review     Row Name 08/10/22 1513 09/20/22 1705           Core Components/Risk Factors/Patient Goals Review   Personal Goals Review Weight Management/Obesity;Hypertension;Lipids;Stress Weight  Management/Obesity;Hypertension;Lipids;Stress      Review Richardson Landry started intensive cardiac rehab on 08/09/22. Vital signs were stable Richardson Landry is doing well with exercise at  intensive cardiac rehab . Vital signs remain stable      Expected Outcomes Richardson Landry will continue to participate in intensive cardiac rehab for exercise, nutrition and lifestyle modifications Richardson Landry will continue to participate in intensive cardiac rehab for exercise, nutrition and lifestyle modifications               Core Components/Risk Factors/Patient Goals at Discharge (Final Review):   Goals and Risk Factor Review - 09/20/22 1705       Core Components/Risk Factors/Patient Goals Review   Personal Goals Review Weight Management/Obesity;Hypertension;Lipids;Stress    Review Richardson Landry is doing well with exercise at  intensive cardiac rehab . Vital signs remain stable    Expected Outcomes Richardson Landry will continue to participate in intensive cardiac rehab for exercise, nutrition  and lifestyle modifications             ITP Comments:  ITP Comments     Row Name 08/03/22 1016 08/10/22 1507 08/29/22 1014 09/20/22 1702     ITP Comments Fransico Him, MD, Medical Director.  Introduction to the Pritikin Education Program/Intensive Cardiac Rehab. Initial packet reviewed with the patient. 30 Day ITP Review. Richardson Landry started intensive cardiac rehab on 08/09/22 and did well with exercise 30 Day ITP Review. Richardson Landry has good attendance and participation in intensive cardiac rehab 30 Day ITP Review. Richardson Landry continues to have good attendance and participation in intensive cardiac rehab             Comments: See ITP comments.Harrell Gave RN BSN

## 2022-09-22 ENCOUNTER — Encounter (HOSPITAL_COMMUNITY)
Admission: RE | Admit: 2022-09-22 | Discharge: 2022-09-22 | Disposition: A | Discharge status: Home / Self Care | Payer: Medicare Other | Source: Ambulatory Visit | Attending: Internal Medicine | Admitting: Internal Medicine

## 2022-09-22 VITALS — Ht 73.5 in | Wt 187.2 lb

## 2022-09-22 DIAGNOSIS — R079 Chest pain, unspecified: Secondary | ICD-10-CM | POA: Diagnosis not present

## 2022-09-22 DIAGNOSIS — Z955 Presence of coronary angioplasty implant and graft: Secondary | ICD-10-CM | POA: Diagnosis not present

## 2022-09-25 ENCOUNTER — Encounter (HOSPITAL_COMMUNITY)
Admission: RE | Admit: 2022-09-25 | Discharge: 2022-09-25 | Disposition: A | Discharge status: Home / Self Care | Payer: Medicare Other | Source: Ambulatory Visit | Attending: Internal Medicine | Admitting: Internal Medicine

## 2022-09-25 DIAGNOSIS — R079 Chest pain, unspecified: Secondary | ICD-10-CM | POA: Diagnosis not present

## 2022-09-25 DIAGNOSIS — Z955 Presence of coronary angioplasty implant and graft: Secondary | ICD-10-CM

## 2022-09-27 ENCOUNTER — Encounter (HOSPITAL_COMMUNITY)
Admission: RE | Admit: 2022-09-27 | Discharge: 2022-09-27 | Disposition: A | Discharge status: Home / Self Care | Payer: Medicare Other | Source: Ambulatory Visit | Attending: Internal Medicine | Admitting: Internal Medicine

## 2022-09-27 DIAGNOSIS — Z955 Presence of coronary angioplasty implant and graft: Secondary | ICD-10-CM | POA: Diagnosis not present

## 2022-09-27 DIAGNOSIS — R079 Chest pain, unspecified: Secondary | ICD-10-CM | POA: Diagnosis not present

## 2022-09-29 ENCOUNTER — Encounter (HOSPITAL_COMMUNITY)
Admission: RE | Admit: 2022-09-29 | Discharge: 2022-09-29 | Disposition: A | Discharge status: Home / Self Care | Payer: Medicare Other | Source: Ambulatory Visit | Attending: Internal Medicine | Admitting: Internal Medicine

## 2022-09-29 DIAGNOSIS — R079 Chest pain, unspecified: Secondary | ICD-10-CM | POA: Diagnosis not present

## 2022-09-29 DIAGNOSIS — Z955 Presence of coronary angioplasty implant and graft: Secondary | ICD-10-CM | POA: Diagnosis not present

## 2022-09-29 NOTE — Progress Notes (Signed)
Discharge Progress Report  Patient Details  Name: Jeffery Kirby MRN: [id number] Date of Birth: 09/30/1945 Referring Provider:   Flowsheet Row INTENSIVE CARDIAC REHAB ORIENT from 08/03/2022 in [facility]  Referring Provider Meryle Ready, MD        Number of Visits: 75  Reason for Discharge:  Patient reached a stable level of exercise. Patient independent in their exercise. Patient has met program and personal goals.  Smoking History:  Social History   Tobacco Use  Smoking Status Never  Smokeless Tobacco Never    Diagnosis:  04/25/22 Coronary artey stent placement LAD  Chest pain, unspecified type  ADL UCSD:   Initial Exercise Prescription:  Initial Exercise Prescription - 08/03/22 1300       Date of Initial Exercise RX and Referring Provider   Date 08/03/22    Referring Provider Meryle Ready, MD    Expected Discharge Date 09/29/22      Bike   Level 1    Watts 25    Minutes 15    METs 2.9      NuStep   Level 1    SPM 75    Minutes 15    METs 2.9      Prescription Details   Frequency (times per week) 3    Duration Progress to 30 minutes of continuous aerobic without signs/symptoms of physical distress      Intensity   THRR 40-80% of Max Heartrate 58-117    Ratings of Perceived Exertion 11-13    Perceived Dyspnea 0-4      Progression   Progression Continue progressive overload as per policy without signs/symptoms or physical distress.      Resistance Training   Training Prescription Yes    Weight 3 lbs    Reps 10-15             Discharge Exercise Prescription (Final Exercise Prescription Changes):  Exercise Prescription Changes - 09/29/22 1400       Response to Exercise   Blood Pressure (Admit) 122/64    Blood Pressure (Exercise) 120/70    Blood Pressure (Exit) 92/60    Heart Rate (Admit) 71 bpm    Heart Rate (Exercise) 84 bpm    Heart Rate (Exit) 67 bpm     Rating of Perceived Exertion (Exercise) 11    Symptoms None    Comments Pt graduated from the CRP2 program today    Duration Continue with 30 min of aerobic exercise without signs/symptoms of physical distress.    Intensity THRR unchanged      Progression   Progression Continue to progress workloads to maintain intensity without signs/symptoms of physical distress.    Average METs 3.35      Resistance Training   Training Prescription Yes    Weight 5 lb wts    Reps 10-15    Time 10 Minutes      Interval Training   Interval Training No      Bike   Level 3    Watts 44    Minutes 15    METs 4.8      NuStep   Level 3    SPM 93    Minutes 15    METs 1.9      Home Exercise Plan   Plans to continue exercise at [employer] (comment)    Frequency Add 2 additional days to program exercise sessions.    Initial Home Exercises Provided 09/20/22  Functional Capacity:  6 Minute Walk     Row Name 08/03/22 1256 09/20/22 1258       6 Minute Walk   Phase Initial Discharge    Distance 1416 feet 1721 feet    Distance % Change -- 21.54 %    Distance Feet Change -- 305 ft    Walk Time 6 minutes 6 minutes    # of Rest Breaks 0 0    MPH 2.7 3.3    METS 2.9 3.75    RPE 11 10    Perceived Dyspnea  0 0    VO2 Peak 10.2 13.14    Symptoms No No    Resting HR 78 bpm 60 bpm    Resting BP 110/70 110/72    Resting Oxygen Saturation  97 % --    Exercise Oxygen Saturation  during 6 min walk 99 % --    Max Ex. HR 79 bpm 108 bpm    Max Ex. BP 118/70 132/80    2 Minute Post BP 112/71 --             Psychological, QOL, Others - Outcomes: PHQ 2/9:    09/29/2022    1:56 PM 08/03/2022    1:00 PM  Depression screen PHQ 2/9  Decreased Interest 0 0  Down, Depressed, Hopeless 0 0  PHQ - 2 Score 0 0  Altered sleeping 0 0  Tired, decreased energy 0 1  Change in appetite 0 1  Feeling bad or failure about yourself  0 0  Trouble concentrating 0 0  Moving slowly or  fidgety/restless 0 0  Suicidal thoughts 0 0  PHQ-9 Score 0 2  Difficult doing work/chores Not difficult at all Not difficult at all    Quality of Life:  Quality of Life - 09/27/22 1600       Quality of Life Scores   Health/Function Pre 16.33 %    Health/Function Post 20.9 %    Health/Function % Change 27.99 %    Socioeconomic Pre 24.33 %    Socioeconomic Post 25.14 %    Socioeconomic % Change  3.33 %    Psych/Spiritual Pre 20 %    Psych/Spiritual Post 22.5 %    Psych/Spiritual % Change 12.5 %    Family Pre 17.25 %    Family Post 22.38 %    Family % Change 29.74 %    GLOBAL Pre 18.75 %    GLOBAL Post 22.31 %    GLOBAL % Change 18.99 %             Personal Goals: Goals established at orientation with interventions provided to work toward goal.  Personal Goals and Risk Factors at Admission - 08/03/22 1302       Core Components/Risk Factors/Patient Goals on Admission    Weight Management Yes    Intervention Weight Management: Develop a combined nutrition and exercise program designed to reach desired caloric intake, while maintaining appropriate intake of nutrient and fiber, sodium and fats, and appropriate energy expenditure required for the weight goal.;Weight Management: Provide education and appropriate resources to help participant work on and attain dietary goals.    Expected Outcomes Short Term: Continue to assess and modify interventions until short term weight is achieved;Long Term: Adherence to nutrition and physical activity/exercise program aimed toward attainment of established weight goal;Understanding recommendations for meals to include 15-35% energy as protein, 25-35% energy from fat, 35-60% energy from carbohydrates, less than 200mg  of dietary cholesterol, 20-35 gm of  total fiber daily    Hypertension Yes    Intervention Provide education on lifestyle modifcations including regular physical activity/exercise, weight management, moderate sodium restriction and  increased consumption of fresh fruit, vegetables, and low fat dairy, alcohol moderation, and smoking cessation.;Monitor prescription use compliance.    Expected Outcomes Short Term: Continued assessment and intervention until BP is < 140/39mm HG in hypertensive participants. < 130/20mm HG in hypertensive participants with diabetes, heart failure or chronic kidney disease.;Long Term: Maintenance of blood pressure at goal levels.    Lipids Yes    Intervention Provide education and support for participant on nutrition & aerobic/resistive exercise along with prescribed medications to achieve LDL 70mg , HDL >40mg .    Expected Outcomes Short Term: Participant states understanding of desired cholesterol values and is compliant with medications prescribed. Participant is following exercise prescription and nutrition guidelines.;Long Term: Cholesterol controlled with medications as prescribed, with individualized exercise RX and with personalized nutrition plan. Value goals: LDL < 70mg , HDL > 40 mg.    Stress Yes    Intervention Offer individual and/or small group education and counseling on adjustment to heart disease, stress management and health-related lifestyle change. Teach and support self-help strategies.;Refer participants experiencing significant psychosocial distress to appropriate mental health specialists for further evaluation and treatment. When possible, include family members and significant others in education/counseling sessions.    Expected Outcomes Short Term: Participant demonstrates changes in health-related behavior, relaxation and other stress management skills, ability to obtain effective social support, and compliance with psychotropic medications if prescribed.;Long Term: Emotional wellbeing is indicated by absence of clinically significant psychosocial distress or social isolation.              Personal Goals Discharge:  Goals and Risk Factor Review     Row Name 08/10/22 1513  09/20/22 1705           Core Components/Risk Factors/Patient Goals Review   Personal Goals Review Weight Management/Obesity;Hypertension;Lipids;Stress Weight Management/Obesity;Hypertension;Lipids;Stress      Review Jeffery Kirby started intensive cardiac rehab on 08/09/22. Vital signs were stable Jeffery Kirby is doing well with exercise at  intensive cardiac rehab . Vital signs remain stable      Expected Outcomes Jeffery Kirby will continue to participate in intensive cardiac rehab for exercise, nutrition and lifestyle modifications Jeffery Kirby will continue to participate in intensive cardiac rehab for exercise, nutrition and lifestyle modifications               Exercise Goals and Review:  Exercise Goals     Row Name 08/03/22 1258             Exercise Goals   Increase Physical Activity Yes       Intervention Provide advice, education, support and counseling about physical activity/exercise needs.;Develop an individualized exercise prescription for aerobic and resistive training based on initial evaluation findings, risk stratification, comorbidities and participant's personal goals.       Expected Outcomes Short Term: Attend rehab on a regular basis to increase amount of physical activity.;Long Term: Add in home exercise to make exercise part of routine and to increase amount of physical activity.;Long Term: Exercising regularly at least 3-5 days a week.       Increase Strength and Stamina Yes       Intervention Provide advice, education, support and counseling about physical activity/exercise needs.;Develop an individualized exercise prescription for aerobic and resistive training based on initial evaluation findings, risk stratification, comorbidities and participant's personal goals.       Expected Outcomes Short Term: Increase  workloads from initial exercise prescription for resistance, speed, and METs.;Short Term: Perform resistance training exercises routinely during rehab and add in resistance training  at home;Long Term: Improve cardiorespiratory fitness, muscular endurance and strength as measured by increased METs and functional capacity (6MWT)       Able to understand and use rate of perceived exertion (RPE) scale Yes       Intervention Provide education and explanation on how to use RPE scale       Expected Outcomes Short Term: Able to use RPE daily in rehab to express subjective intensity level;Long Term:  Able to use RPE to guide intensity level when exercising independently       Knowledge and understanding of Target Heart Rate Range (THRR) Yes       Intervention Provide education and explanation of THRR including how the numbers were predicted and where they are located for reference       Expected Outcomes Short Term: Able to state/look up THRR;Short Term: Able to use daily as guideline for intensity in rehab;Long Term: Able to use THRR to govern intensity when exercising independently       Understanding of Exercise Prescription Yes       Intervention Provide education, explanation, and written materials on patient's individual exercise prescription       Expected Outcomes Short Term: Able to explain program exercise prescription;Long Term: Able to explain home exercise prescription to exercise independently                Exercise Goals Re-Evaluation:  Exercise Goals Re-Evaluation     Row Name 08/09/22 1434 09/06/22 1635 09/29/22 1412         Exercise Goal Re-Evaluation   Exercise Goals Review Increase Physical Activity;Increase Strength and Stamina;Able to understand and use rate of perceived exertion (RPE) scale;Understanding of Exercise Prescription;Knowledge and understanding of Target Heart Rate Range (THRR) Increase Physical Activity;Increase Strength and Stamina;Able to understand and use rate of perceived exertion (RPE) scale;Understanding of Exercise Prescription;Knowledge and understanding of Target Heart Rate Range (THRR) Increase Physical Activity;Increase Strength  and Stamina;Able to understand and use rate of perceived exertion (RPE) scale;Understanding of Exercise Prescription;Knowledge and understanding of Target Heart Rate Range (THRR)     Comments Pt's first day in the CRP2 program. Pt understands the exercise RX, THRR and RPE scale. Reviewed METs and goals with patient today. Pt voices feeling stonger. Pt is walking at home and going to [employer] 1-2x/week. Pt graduated from the [place] program today. Pt will continue to exercise at [facility] using the treadmill, recumbent bike and weight machines. 5-7x/week, 30-45 minutesper session encouraged.  Pt currently does 30 minutes of aerobic and 30 minutes weight machines     Expected Outcomes Will continue to monitor patient and progress exericse workloads as tolerated. Will continue to monitor patient and progress exericse workloads as tolerated. Will continue to exercise at [employer].              Nutrition & Weight - Outcomes:  Pre Biometrics - 08/03/22 0940       Pre Biometrics   Waist Circumference 40.75 inches    Hip Circumference 40.75 inches    Waist to Hip Ratio 1 %    Triceps Skinfold 12 mm    % Body Fat 25.6 %    Grip Strength 30 kg    Flexibility 0 in   Pt could not reach   Single Leg Stand 12.87 seconds  Post Biometrics - 09/22/22 1434        Post  Biometrics   Height 6' 1.5" (1.867 m)    Weight 84.9 kg    Waist Circumference 40 inches    Hip Circumference 38.5 inches    Waist to Hip Ratio 1.04 %    BMI (Calculated) 24.36    Triceps Skinfold 12 mm    % Body Fat 25.2 %    Grip Strength 32 kg    Flexibility 10 in    Single Leg Stand 23 seconds             Nutrition:  Nutrition Therapy & Goals - 09/08/22 1443       Nutrition Therapy   Diet Heart Healthy Diet    Drug/Food Interactions Statins/Certain Fruits      Personal Nutrition Goals   Nutrition Goal Patient to identify strategies for reducing cardiovascular risk by attending the  weekly [employer] education and nutrition series    Personal Goal #2 Patient to improve diet quality by using the plate method as a daily guide for meal planning to include lean protein/plant protein, fruits, vegetables, whole grains, nonfat dairy as part of well balanced diet    Personal Goal #3 Patient to reduce sodium intake to 1500mg  per day    Comments Goals in action. Jeffery Kirby continues to attend the [employer] and nutrition series regularly. Jeffery Kirby has started making many dietary changes including decreased saturated fat and using lower fat cooking methods and unsaturated fats. Jeffery Kirby has good understanding of the benefits of low sodium diet and high fiber intake. His wife is very supportive of dietary changes. Jeffery Kirby will continue to benefit from participation in intensive cardiac rehab for nutrition, exercise, and lifestyle modification.      Intervention Plan   Intervention Prescribe, educate and counsel regarding individualized specific dietary modifications aiming towards targeted core components such as weight, hypertension, lipid management, diabetes, heart failure and other comorbidities.;Nutrition handout(s) given to patient.    Expected Outcomes Short Term Goal: Understand basic principles of dietary content, such as calories, fat, sodium, cholesterol and nutrients.;Long Term Goal: Adherence to prescribed nutrition plan.             Nutrition Discharge:  Nutrition Assessments - 10/02/22 0838       Rate Your Plate Scores   Post Score 74             Education Questionnaire Score:  Knowledge Questionnaire Score - 08/03/22 1302       Knowledge Questionnaire Score   Pre Score 17/24             Goals reviewed with patient; copy given to patient.Pt graduates from  Intensive cardiac rehab program on 09/29/22  with completion of 21  exercise and  20education sessions. Pt maintained good attendance and progressed nicely during their participation in rehab as evidenced  by increased MET level. Jeffery Kirby increased his distance on his post exercise walk test by 305 feet.  Medication list reconciled. Repeat  PHQ score- 0 .  Pt has made significant lifestyle changes and should be commended for their success. Jeffery Kirby achieved their goals during cardiac rehab.   Pt plans to continue exercise at [employer]. We are proud of Jeffery Kirby's progress!Harrell Gave RN BSN

## 2022-10-02 ENCOUNTER — Encounter (HOSPITAL_COMMUNITY): Payer: Medicare Other

## 2022-10-04 ENCOUNTER — Encounter (HOSPITAL_COMMUNITY): Payer: Medicare Other

## 2022-10-06 ENCOUNTER — Telehealth: Payer: Self-pay | Admitting: Internal Medicine

## 2022-10-06 ENCOUNTER — Encounter (HOSPITAL_COMMUNITY): Payer: Medicare Other

## 2022-10-06 NOTE — Telephone Encounter (Signed)
Patient called to reschedule phone visit with MD because [place] will be [place] of the [place] late [place]. Reschedule phone visit for when patient will be able to receive a call.

## 2022-10-09 ENCOUNTER — Encounter (HOSPITAL_COMMUNITY): Payer: Medicare Other

## 2022-10-11 ENCOUNTER — Encounter (HOSPITAL_COMMUNITY): Payer: Medicare Other

## 2022-10-13 ENCOUNTER — Encounter (HOSPITAL_COMMUNITY): Payer: Medicare Other

## 2022-10-13 DIAGNOSIS — H40013 Open angle with borderline findings, low risk, bilateral: Secondary | ICD-10-CM | POA: Diagnosis not present

## 2022-10-15 NOTE — Progress Notes (Unsigned)
Cardiology Office Note:    Date:  10/16/2022   ID:  Loranzo Desha, DOB Aug 07, 1945, MRN [id number]  PCP:  Cleatis Polka., MD  [facility] Cardiologist: Riley Lam MD [facility] Electrophysiologist:  None   CC: CAD f/u  History of Present Illness:    Yamin Swingler is a 77 y.o. male with a HTN and HLD who presented for evaluation 07/13/20.  2022: Cardiac CT which showed multi-vessel disease; patient WUJ:[id number] non obstructive disease save for with obstructive apical disease. With medical therapy, lightheaded on BB.   2023: started dietary changes for CAD secondary prevention.  Had worsening CP.  R Radial approach PCI of LAD.  He had improvement in angina but notable right arm pain.   Saw [facility].  Saw APP in 05/03/22. 2024: Still having dizziness.    Patient notes that he is doing Ok.   Since last visit notes that he did well in [place] . There are no interval hospital/ED visit.   Finished cardiac rehab with no real improvement.  His angina has largely resolved after PCI.  Still have some chest tightness. Orthostasis has not really changes since stopping Ranexa, Imdur, or decreasing metoprolol. He now has SOB that occurs intermittently; sometimes with rest.  No syncope.     Past Medical History:  Diagnosis Date   Angina pectoris    Cataract    Exertional chest pain    Hyperlipidemia    Hypertension    Shingles 2012   Tinnitus     Past Surgical History:  Procedure Laterality Date   COLONOSCOPY     CORONARY PRESSURE/FFR STUDY N/A 04/25/2022   Procedure: INTRAVASCULAR PRESSURE WIRE/FFR STUDY;  Surgeon: Corky Crafts, MD;  Location: [facility] INVASIVE CV LAB;  Service: Cardiovascular;  Laterality: N/A;   CORONARY STENT INTERVENTION N/A 04/25/2022   Procedure: CORONARY STENT INTERVENTION;  Surgeon: Corky Crafts, MD;  Location: [facility] INVASIVE CV LAB;  Service: Cardiovascular;  Laterality: N/A;   LEFT HEART CATH AND CORONARY ANGIOGRAPHY N/A  08/16/2020   Procedure: LEFT HEART CATH AND CORONARY ANGIOGRAPHY;  Surgeon: Yvonne Kendall, MD;  Location: [facility] INVASIVE CV LAB;  Service: Cardiovascular;  Laterality: N/A;   LEFT HEART CATH AND CORONARY ANGIOGRAPHY N/A 04/25/2022   Procedure: LEFT HEART CATH AND CORONARY ANGIOGRAPHY;  Surgeon: Corky Crafts, MD;  Location: [facility] INVASIVE CV LAB;  Service: Cardiovascular;  Laterality: N/A;   ROTATOR CUFF REPAIR Bilateral    wisdom teeth      Current Medications: Current Meds  Medication Sig   alum hydroxide-mag trisilicate (GAVISCON) 80-20 MG CHEW chewable tablet Chew 2 tablets by mouth daily as needed for indigestion or heartburn.   aspirin EC 81 MG tablet Take 1 tablet (81 mg total) by mouth daily. Swallow whole.   clopidogrel (PLAVIX) 75 MG tablet Take 1 tablet (75 mg total) by mouth daily.   Cyanocobalamin (B-12) 1000 MCG SUBL Place 1,000 mcg under the tongue 2 (two) times a week.   metoprolol tartrate (LOPRESSOR) 25 MG tablet Take 0.5 tablets (12.5 mg total) by mouth 2 (two) times daily.   nitroGLYCERIN (NITROSTAT) 0.4 MG SL tablet Place 1 tablet (0.4 mg total) under the tongue every 5 (five) minutes as needed for chest pain.   Polyethyl Glycol-Propyl Glycol (SYSTANE OP) Place 1 drop into both eyes daily as needed (dry eyes).   rosuvastatin (CRESTOR) 40 MG tablet TAKE ONE TABLET BY MOUTH DAILY     Allergies:   Patient has no known allergies.   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  Tobacco Use   Smoking status: Never   Smokeless tobacco: Never  Vaping Use   Vaping Use: Never used  Substance and Sexual Activity   Alcohol use: Yes    Comment: occasional beer   Drug use: Not Currently   Sexual activity: Not on file   Other Topics Concern   Not on file  Social History Narrative   Right handed   Lives with wife    Retired   International aid/development worker of Health   Financial Resource Strain: Not on file  Food Insecurity: No Food Insecurity (04/25/2022)   Hunger Vital Sign    Worried About Running Out of Food in the Last Year: Never true    Ran Out of Food in the Last Year: Never true  Transportation Needs: No Transportation Needs (04/25/2022)   PRAPARE - Administrator, Civil Service (Medical): No    Lack of Transportation (Non-Medical): No  Physical Activity: Not on file  Stress: Not on file  Social Connections: Not on file    Social: Micah Flesher to [employer], married; retired; went to [place] in 2022, went to [place] in 2023 [place] in 2024  Family History: The patient's family history includes COPD in his mother; Diabetes in his mother; Heart attack in his father, sister, and sister; Kidney disease in his mother. There is no history of Colon cancer, Colon polyps, Esophageal cancer, Rectal cancer, or Stomach cancer. History of coronary artery disease notable for sisters and father. History of heart failure notable for no members. History of arrhythmia notable for no members. Sister died young at the age of 55 (passed in her sleep). Brother has new ICD  ROS:   Please see the history of present illness.     All other systems reviewed and are negative.  EKGs/Labs/Other Studies Reviewed:    The following studies were reviewed today:  EKG:   3/923: SR rate 73 08/24/20: Sinus Bradycardia 1st HB 07/13/20: SR rate 69 1st Hb with Baseline artifact  Cardiac Studies & Procedures   CARDIAC CATHETERIZATION  CARDIAC CATHETERIZATION 04/25/2022  Narrative   Dist LAD lesion is 95% stenosed.  Unchanged from prior and too distal for intervention.   Ost Cx to Prox Cx lesion is 20% stenosed.   1st Diag lesion is 50% stenosed.   1st LPL lesion is 40% stenosed.   Prox LAD lesion is 50% stenosed.   RFR abnormal, 0.83 indicating significant flow reduction in this territory.   A drug-eluting stent was successfully placed using a SYNERGY XD 2.75X24, postdilated to 3.25 mm.   Post intervention, there is a 0% residual stenosis.   The left ventricular systolic function is normal.   LV end diastolic pressure is normal.   The left ventricular ejection fraction is 55-65% by visual estimate.   There is no aortic valve stenosis.  Continue dual antiplatelet therapy along with aggressive secondary prevention.  He had typical anginal symptoms with balloon inflations.  Should be able to gradually decrease antianginal therapy starting with ranolazine.  He does still have distal LAD disease which is not amenable to PCI.  Findings Coronary Findings Diagnostic  Dominance: Co-dominant  Left Main Vessel is large.  Vessel is angiographically normal.  Left Anterior Descending Vessel is large. Prox LAD lesion is 50% stenosed. The lesion is eccentric. The lesion is severely calcified. Pressure wire/FFR was performed on the lesion. RFR 0.83, positive for ischemia Dist LAD lesion is 95% stenosed.  First Diagonal Branch Vessel is small in size. 1st Diag lesion is 50% stenosed.  Second Diagonal Branch Vessel is small in size.  Third Diagonal Branch Vessel is small in size.  Ramus Intermedius Vessel is small. Vessel is angiographically normal.  Left Circumflex Vessel is large. Ost Cx to Prox Cx lesion is 20% stenosed.  First Obtuse Marginal Branch Vessel is small in size.  Second Obtuse Marginal Branch Vessel is moderate in size. The vessel exhibits minimal luminal irregularities.  Third Obtuse Marginal Branch Vessel is moderate in size. The vessel exhibits minimal luminal irregularities.  First Left Posterolateral Branch Vessel is large in size. 1st LPL lesion is 40% stenosed.  Second Left Posterolateral Branch Vessel is small in size.  Third Left Posterolateral Branch Vessel is small  in size.  Right Coronary Artery Vessel is moderate in size. There is moderate diffuse disease throughout the vessel.  Right Posterior Descending Artery Vessel is small in size.  Intervention  Prox LAD lesion Stent CATH LAUNCHER 6FR EBU3.5 guide catheter was inserted. Lesion crossed with guidewire using a GUIDEWIRE PRESSURE X 175. Pre-stent angioplasty was performed using a BALLN WOLVERINE 2.75X10. A drug-eluting stent was successfully placed using a SYNERGY XD 2.75X24. Post-stent angioplasty was performed using a BALL SAPPHIRE NC24 3.25X12. Prowater used as a Therapist, art.  Initially, a 3.0 x 15 Wolverine balloon was used to attempt to predilate.  This would not cross the tortuosity into the diseased area of the LAD.  We then used a 2.5 semicompliant balloon to predilate.  The 3.0 Wolverine balloon still would not cross.  We then changed for a 2.75 x 12 Wolverine balloon which eventually did cross into the diseased area of the proximal LAD.  Multiple inflations were done.  A 2.75 x 24 Synergy drug-eluting stent was then deployed and subsequently postdilated with a 3.25 Alligator balloon. Post-Intervention Lesion Assessment The intervention was successful. Pre-interventional TIMI flow is 3. Post-intervention TIMI flow is 3. No complications occurred at this lesion. Additional compression to the forearm performed with blood pressure cuff due to concern for hematoma. There is a 0% residual stenosis post intervention.   CARDIAC CATHETERIZATION  CARDIAC CATHETERIZATION 08/16/2020  Narrative Conclusions: 1. Multivessel coronary artery disease, as detailed below.  Most severe lesion is a 95% stenosis involving the apical LAD.  There are also 50% stenoses involving the proximal LAD and small D1 branch, as well as 40% lPL1 stenosis and moderate diffuse disease involving the RCA. 2. Low normal left ventricular systolic function (LVEF 50-55%) with mildly elevated filling pressure (LVEDP ~20  mmHg).  Recommendations: 1. Optimize medical therapy; 95% apical LAD stenosis is not well-suited for PCI given distal location and vessel size.  Will add metoprolol tartrate 25 mg twice daily. 2. Aggressive secondary prevention of coronary artery disease.  Yvonne Kendall, MD [facility]  Findings Coronary Findings Diagnostic  Dominance: Co-dominant  Left Main Vessel is large. Vessel is angiographically normal.  Left Anterior Descending Vessel is large. Prox LAD lesion is 50% stenosed. The lesion is eccentric. The lesion is severely calcified. Dist LAD lesion is 95% stenosed.  First Diagonal Branch Vessel is small in size. 1st Diag lesion is 50% stenosed.  Second Diagonal Branch Vessel is small in size.  Third Diagonal Branch Vessel is small in size.  Ramus Intermedius Vessel is small. Vessel is angiographically normal.  Left Circumflex Vessel is large. Ost Cx to Prox Cx lesion is 20% stenosed.  First Obtuse Marginal Branch Vessel is small in size.  Second Obtuse Marginal Branch Vessel is moderate in size. The vessel exhibits minimal luminal irregularities.  Third Obtuse Marginal Branch Vessel is moderate in size. The vessel exhibits minimal luminal irregularities.  First Left Posterolateral Branch Vessel is large in size. 1st LPL lesion is 40% stenosed.  Second Left Posterolateral Branch Vessel is small in size.  Third Left Posterolateral Branch Vessel is small in size.  Right Coronary Artery Vessel is moderate in size. There is moderate diffuse disease throughout the vessel.  Right Posterior Descending Artery Vessel is small in size.  Intervention  No interventions have been documented.        CT SCANS  CT CORONARY MORPH W/CTA COR W/SCORE 08/10/2020  Addendum 08/10/2020 12:55 PM ADDENDUM REPORT: 08/10/2020 12:52  CLINICAL DATA:  76 Year-old White Male  EXAM: Cardiac/Coronary CTA  TECHNIQUE: The patient was scanned on a [employer].  FINDINGS: A 100 kV prospective scan was triggered in the descending thoracic aorta at 111 HU's. Axial non-contrast 3 mm slices were carried out through the heart. The data set was analyzed on a dedicated work station and scored using the Agatson method. Gantry rotation speed was 250 msecs and collimation was .6 mm. No beta blockade and 0.8 mg of sl NTG was given. The 3D data set was reconstructed in 5% intervals of the 67-82 % of the R-R cycle. Diastolic phases were analyzed on a dedicated work station using MPR, MIP and VRT modes. The patient received 100 cc of contrast.  Aorta: Upper limits of normal; ascending aortic size 39 mm, with ascending aortic height index 2.10 cm/m. Descending aortic atherosclerosis noted. No dissection.  Aortic Valve: Tri-leaflet. Multiple small annular calcifications noted. Mild valvular calcification.  Coronary Arteries:  Normal coronary origin.  Right dominance.  Coronary calcium score of 1724. This was 90th percentile for age, sex, and race matched control.  RCA is a large dominant artery that gives rise to PDA and PLA. There is a 70-99% mixed plaque stenosis, with a subsequent 50-70% mixed plaque stenosis in the proximal vessel, there is a 50-70% mixed plaque stenosis in the mid vessel, there is a 70-90% calcified plaque stenosis in the distal vessel. In addition there are minimal non-obstructive calcified plaques scattered through the mid/distal vessel and its branches.  Left main is a large artery that gives rise to LAD and LCX arteries. The ostia of the left main appear aneurysmal measuring 16 mm with mild calcification. This narrows to 5 mm by the distal vessel. There is 30% distal stenosis with a calcified plaque with napkin ring sign (high risk feature).  LAD is a large vessel that gives off two diagonal vessels. There is an ostial 50-70% calcified stenosis narrowing to a 70-99% mixed plaque stenosis in the proximal vessel  with 50-70% calcified plaque stenosis in the mid vessel. There is a mild non obstructive (25-49%) calcified plaque in the ostium of the D1 vessel. There is a mild non obstructive (25-49%) calcified plaque in the ostium of the distal vessel.  LCX is a non-dominant artery that gives rise to four obtuse marginal vessels including a large OM4 branch that bifurcates. There is moderate (50-70%) calcified stenosis in the proximal vessel with multiple minimal non obstructive (1-24%) calcified plaques in  the mid and distant vessel. There is a minimal non obstructive (1-24%) calcified plaque in the of the OM1 vessel. There is a minimal non obstructive (1-24%) soft plaque in the of the OM3 vessel. There is a mild non obstructive (25-49%) calcified plaque in the OM4 vessel prior to the bifurcation.  Other findings:  Normal pulmonary vein drainage into the left atrium.  Normal left atrial appendage without a thrombus.  Normal size of the pulmonary artery.  Extra-cardiac findings: See attached radiology report for non-cardiac structures.  IMPRESSION: 1. Coronary calcium score of 1724. This was 90th percentile for age, sex, and race matched control.  2. Normal coronary origin with right dominance.  3. CAD-RADS 4 Severe stenosis. (70-99% or > 50% left main). There is left main, RCA, LAD, and LCX disease. Cardiac catheterization or CT FFR is recommended. Consider symptom-guided anti-ischemic pharmacotherapy as well as risk factor modification per guideline directed care.  4.  Left main appears aneurysmal: Ostial diameter measured at 16 mm.  5. Upper limits of normal ascending aortic size 39 mm, with ascending aortic height index 2.10 cm/m.  6.  Aortic atherosclerosis noted.  7.  Mild aortic valve calcification and mild annular calcification.   Electronically Signed By: Riley Lam MD On: 08/10/2020 12:52  Narrative EXAM: OVER-READ INTERPRETATION  CT CHEST  The  following report is an over-read performed by radiologist Dr. Irish Lack of [facility] on 08/10/2020. This over-read does not include interpretation of cardiac or coronary anatomy or pathology. The coronary CTA interpretation by the cardiologist is attached.  COMPARISON:  None.  FINDINGS: Vascular: No significant vascular findings. Normal heart size. No pericardial effusion.  Mediastinum/Nodes: Visualized mediastinum and hilar regions demonstrate calcified right hilar and subcarinal lymph nodes consistent with prior granulomatous disease.  Lungs/Pleura: Visualized lungs show no evidence of pulmonary edema, consolidation, pneumothorax, nodule or pleural fluid.  Upper Abdomen: No acute abnormality.  Musculoskeletal: No chest wall mass or suspicious bone lesions identified.  IMPRESSION: Calcified right hilar and subcarinal lymph nodes consistent with prior granulomatous disease.  Electronically Signed: By: Irish Lack M.D. On: 08/10/2020 11:17           Recent Labs: 04/26/2022: BUN 15; Creatinine, Ser 1.15; Hemoglobin 12.7; Platelets 175; Potassium 3.7; Sodium 138  Recent Lipid Panel    Component Value Date/Time   CHOL 116 11/23/2020 0811   TRIG 73 11/23/2020 0811   HDL 52 11/23/2020 0811   CHOLHDL 2.2 11/23/2020 0811   LDLCALC 49 11/23/2020 0811    Physical Exam:    VS:  BP 108/64   Pulse 74   Ht 6\' 2"  (1.88 m)   Wt 187 lb (84.8 kg)   SpO2 96%   BMI 24.01 kg/m     Wt Readings from Last 3 Encounters:  10/16/22 187 lb (84.8 kg)  09/22/22 187 lb 2.7 oz (84.9 kg)  08/03/22 189 lb 2.5 oz (85.8 kg)    Gen: No distress   Neck: No JVD Cardiac: No Rubs or Gallops no systolic murmur  Respiratory: Clear to auscultation bilaterally, normal effort, normal  respiratory rate GI: Soft, nontender, non-distended  MS: New left leg (ankle) pitting  edema;  moves all extremities Integument: Skin feels war Neuro:  At time of evaluation, alert and  oriented to person/place/time/situation Psych: Normal affect, patient feels well  ASSESSMENT:    1. SOB (shortness of breath)   2. Leg swelling   3. Hypotension, unspecified hypotension type   4. Aortic atherosclerosis  5. Coronary artery disease involving native coronary artery of native heart with unstable angina pectoris   6. Essential hypertension      PLAN:    SOB LE Swelling FHX of VT younger brother - will get echo - if still symptomatic after echo will send ziopatch  Coronary Artery Disease; Obstructive - Chronic stable angina Hyperlipidemia (mixed) Aortic Atherosclerosis - if worsening orthostatics will do trial of BB - continue DAPT and statin - in the future could re-trial Ranexa - s/p Cardiac Rehab - LDL at goal  HTN - discussed salt and water balance given the above   Fall f/u with me and or team     Medication Adjustments/Labs and Tests Ordered: Current medicines are reviewed at length with the patient today.  Concerns regarding medicines are outlined above.  Orders Placed This Encounter  Procedures   ECHOCARDIOGRAM COMPLETE    No orders of the defined types were placed in this encounter.    Patient Instructions  Medication Instructions:  Your physician recommends that you continue on your current medications as directed. Please refer to the Current Medication list given to you today.  *If you need a refill on your cardiac medications before your next appointment, please call your pharmacy*   Lab Work: NONE If you have labs (blood work) drawn today and your tests are completely normal, you will receive your results only by: MyChart Message (if you have MyChart) OR A paper copy in the mail If you have any lab test that is abnormal or we need to change your treatment, we will call you to review the results.   Testing/Procedures: Your physician has requested that you have an echocardiogram. Echocardiography is a painless test that uses  sound waves to create images of your heart. It provides your doctor with information about the size and shape of your heart and how well your heart's chambers and valves are working. This procedure takes approximately one hour. There are no restrictions for this procedure. Please do NOT wear cologne, perfume, aftershave, or lotions (deodorant is allowed). Please arrive 15 minutes prior to your appointment time.   Your Physician has advised you to wear an Abdominal Binder and Compression socks for Hypotension.   Follow-Up: At [facility], you and your health needs are our priority.  As part of our continuing mission to provide you with exceptional heart care, we have created designated Provider Care Teams.  These Care Teams include your primary Cardiologist (physician) and Advanced Practice Providers (APPs -  Physician Assistants and Nurse Practitioners) who all work together to provide you with the care you need, when you need it.   Your next appointment:   5-6 month(s)  Provider:   Christell Constant, MD        Signed, Christell Constant, MD  10/16/2022 8:46 AM    [facility]

## 2022-10-16 ENCOUNTER — Encounter: Payer: Medicare Other | Admitting: Psychology

## 2022-10-16 ENCOUNTER — Encounter: Payer: Self-pay | Admitting: Internal Medicine

## 2022-10-16 ENCOUNTER — Ambulatory Visit: Payer: Medicare Other | Attending: Internal Medicine | Admitting: Internal Medicine

## 2022-10-16 VITALS — BP 108/64 | HR 74 | Ht 74.0 in | Wt 187.0 lb

## 2022-10-16 DIAGNOSIS — I959 Hypotension, unspecified: Secondary | ICD-10-CM | POA: Insufficient documentation

## 2022-10-16 DIAGNOSIS — I25110 Atherosclerotic heart disease of native coronary artery with unstable angina pectoris: Secondary | ICD-10-CM

## 2022-10-16 DIAGNOSIS — I700 Atherosclerosis of aorta: Secondary | ICD-10-CM | POA: Insufficient documentation

## 2022-10-16 DIAGNOSIS — I10 Essential (primary) hypertension: Secondary | ICD-10-CM | POA: Diagnosis not present

## 2022-10-16 DIAGNOSIS — M7989 Other specified soft tissue disorders: Secondary | ICD-10-CM | POA: Insufficient documentation

## 2022-10-16 DIAGNOSIS — R0602 Shortness of breath: Secondary | ICD-10-CM | POA: Diagnosis not present

## 2022-10-16 NOTE — Patient Instructions (Signed)
Medication Instructions:  Your physician recommends that you continue on your current medications as directed. Please refer to the Current Medication list given to you today.  *If you need a refill on your cardiac medications before your next appointment, please call your pharmacy*   Lab Work: NONE If you have labs (blood work) drawn today and your tests are completely normal, you will receive your results only by: MyChart Message (if you have MyChart) OR A paper copy in the mail If you have any lab test that is abnormal or we need to change your treatment, we will call you to review the results.   Testing/Procedures: Your physician has requested that you have an echocardiogram. Echocardiography is a painless test that uses sound waves to create images of your heart. It provides your doctor with information about the size and shape of your heart and how well your heart's chambers and valves are working. This procedure takes approximately one hour. There are no restrictions for this procedure. Please do NOT wear cologne, perfume, aftershave, or lotions (deodorant is allowed). Please arrive 15 minutes prior to your appointment time.   Your Physician has advised you to wear an Abdominal Binder and Compression socks for Hypotension.   Follow-Up: At [facility], you and your health needs are our priority.  As part of our continuing mission to provide you with exceptional heart care, we have created designated Provider Care Teams.  These Care Teams include your primary Cardiologist (physician) and Advanced Practice Providers (APPs -  Physician Assistants and Nurse Practitioners) who all work together to provide you with the care you need, when you need it.   Your next appointment:   5-6 month(s)  Provider:   Christell Constant, MD

## 2022-10-23 ENCOUNTER — Encounter: Payer: Medicare Other | Admitting: Psychology

## 2022-10-27 ENCOUNTER — Ambulatory Visit (HOSPITAL_COMMUNITY)
Admission: RE | Admit: 2022-10-27 | Discharge: 2022-10-27 | Disposition: A | Discharge status: Home / Self Care | Payer: Medicare Other | Source: Ambulatory Visit | Attending: Internal Medicine | Admitting: Internal Medicine

## 2022-10-27 DIAGNOSIS — G9389 Other specified disorders of brain: Secondary | ICD-10-CM | POA: Diagnosis not present

## 2022-10-27 DIAGNOSIS — R937 Abnormal findings on diagnostic imaging of other parts of musculoskeletal system: Secondary | ICD-10-CM

## 2022-10-27 MED ORDER — GADOBUTROL 1 MMOL/ML IV SOLN
8.0000 mL | Freq: Once | INTRAVENOUS | Status: AC | PRN
  Administered 2022-10-27: 8 mL via INTRAVENOUS

## 2022-10-30 ENCOUNTER — Ambulatory Visit: Payer: Medicare Other | Admitting: Internal Medicine

## 2022-11-06 ENCOUNTER — Inpatient Hospital Stay: Payer: Medicare Other

## 2022-11-09 ENCOUNTER — Telehealth: Payer: Medicare Other | Admitting: Internal Medicine

## 2022-11-28 ENCOUNTER — Inpatient Hospital Stay: Payer: Medicare Other | Attending: Internal Medicine | Admitting: Internal Medicine

## 2022-11-28 DIAGNOSIS — R937 Abnormal findings on diagnostic imaging of other parts of musculoskeletal system: Secondary | ICD-10-CM

## 2022-11-28 NOTE — Progress Notes (Signed)
I connected with Jeffery Kirby on 11/28/22 at 12:00 PM EDT by telephone visit and verified that I am speaking with the correct person using two identifiers.  I discussed the limitations, risks, security and privacy concerns of performing an evaluation and management service by telemedicine and the availability of in-person appointments. I also discussed with the patient that there may be a patient responsible charge related to this service. The patient expressed understanding and agreed to proceed.  Other persons participating in the visit and their role in the encounter:  n/a   Patient's location:  Home Provider's location:  Office Chief Complaint:  Abnormal magnetic resonance imaging of cervical spine  History of Present Ilness: Jeffery Kirby reports no clinical changes today.  No further issues with cognition, gait.   Observations: Language and cognition at baseline  Imaging: CLINICAL DATA:  Assess treatment response.  CNS neoplasm.   EXAM: MRI HEAD WITHOUT AND WITH CONTRAST   TECHNIQUE: Multiplanar, multiecho pulse sequences of the brain and surrounding structures were obtained without and with intravenous contrast.   CONTRAST:  8mL GADAVIST GADOBUTROL 1 MMOL/ML IV SOLN   COMPARISON:  03/09/2022   FINDINGS: Brain: No recent infarction, hemorrhage, hydrocephalus, extra-axial collection or mass lesion.   Vascular: Normal flow voids and vascular enhancements   Skull and upper cervical spine: Bone lesion of primary concern again seen in the clivus with T1 hypo and T2 very hyperintense area measuring approximately 2 cm craniocaudal. The lesion is a somewhat honeycombed appearance on axial T2 weighted imaging, without notable enhancement. No available CT comparison. The location, lack of enhancement, stability, very bright T2 signal, and lack of soft tissue mass or all suggestive of benign notochordal cell tumor.   Sinuses/Orbits: Bilateral cataract resection    IMPRESSION: Continued reassuring stability of the clivus lesion. 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 Noncontrast head CT would be complementary for detecting matrix or destructive changes.     Electronically Signed   By: Tiburcio Pea M.D.   On: 10/31/2022 21:11  [facility] Clinician Interpretation: I have personally reviewed the CNS images as listed.  My interpretation, in the context of the patient's clinical presentation, is stable disease   Assessment and Plan: Abnormal magnetic resonance imaging of cervical spine  Clinically and radiographically stable.    Follow Up Instructions: RTC in 24 months with repeat cervical spine MRI study  I discussed the assessment and treatment plan with the patient.  The patient was provided an opportunity to ask questions and all were answered.  The patient agreed with the plan and demonstrated understanding of the instructions.    The patient was advised to call back or seek an in-person evaluation if the symptoms worsen or if the condition fails to improve as anticipated.    Henreitta Leber, MD   I provided 22 minutes of non face-to-face telephone visit time during this encounter, and > 50% was spent counseling as documented under my assessment & plan.

## 2022-11-30 ENCOUNTER — Telehealth: Payer: Self-pay | Admitting: Internal Medicine

## 2022-11-30 ENCOUNTER — Ambulatory Visit (HOSPITAL_COMMUNITY): Payer: Medicare Other | Attending: Cardiology

## 2022-11-30 DIAGNOSIS — R0602 Shortness of breath: Secondary | ICD-10-CM | POA: Insufficient documentation

## 2022-11-30 DIAGNOSIS — I959 Hypotension, unspecified: Secondary | ICD-10-CM | POA: Insufficient documentation

## 2022-11-30 DIAGNOSIS — M7989 Other specified soft tissue disorders: Secondary | ICD-10-CM | POA: Insufficient documentation

## 2022-11-30 LAB — ECHOCARDIOGRAM COMPLETE
Area-P 1/2: 2.87 cm2
Calc EF: 50.6 %
P 1/2 time: 445 msec
S' Lateral: 3.7 cm
Single Plane A2C EF: 50.9 %
Single Plane A4C EF: 50.4 %

## 2022-11-30 NOTE — Telephone Encounter (Signed)
   Pt is returning call to get echo result. He is hoping he get a callback today

## 2022-12-01 NOTE — Telephone Encounter (Signed)
Left a message to call back.

## 2022-12-01 NOTE — Telephone Encounter (Signed)
Pt returning nurses call from yesterday regarding test results. Please advise 

## 2022-12-04 NOTE — Telephone Encounter (Signed)
The patient has been notified of the result and verbalized understanding.  All questions (if any) were answered. Alka Falwell N Trai Ells, RN 12/04/2022 5:02 PM   Pt c/o being dizzy, lightheaded, SOB and chest tightness.   Per pt BP runs around 110-70's.  Chest tightness occurs with and without activity.  Pt would like to speak with MD to see if symptoms are r/t medications or his heart.  Pt will hold off on Lasix until after hears from MD.   Will send to MD to advise if an OV is needed.

## 2022-12-04 NOTE — Telephone Encounter (Signed)
Pt returning nurses call from 5/31 regarding results. Please advise

## 2022-12-04 NOTE — Telephone Encounter (Signed)
Left a message to call back.

## 2022-12-05 NOTE — Telephone Encounter (Signed)
Called pt several times throughout the day.  Left a message advising pt scheduled an OV with MD for 12/07/22 at 10:40.  If appointment is not okay with pt call in to reschedule.

## 2022-12-06 NOTE — Progress Notes (Signed)
Cardiology Office Note:    Date:  12/07/2022   ID:  Garick Smyser, DOB December 31, 1945, MRN [id number]  PCP:  Cleatis Polka., MD  [facility] Cardiologist: Riley Lam MD [facility] Electrophysiologist:  None   CC: CAD f/u  History of Present Illness:    Jeffery Kirby is a 77 y.o. male with a HTN and HLD who presented for evaluation 07/13/20.  2022: Cardiac CT which showed multi-vessel disease; patient NWG:NFAOZHY non obstructive disease save for with obstructive apical disease. With medical therapy, lightheaded on BB.   2023: started dietary changes for CAD secondary prevention.  Had worsening CP.  R Radial approach PCI of LAD.  He had improvement in angina but notable right arm pain.   Saw VVS.  Saw APP in 05/03/22. 2024: Still having dizziness.  Completed cardiac rehab.  Worsening SOB: EF mildly decreased.  Patient notes that he is doing poorly.   He has felt fairly poorly.   Has near syncope and had a fall and a significant black eye (pictures reviewed) Chest pain is unchanged (tightness).  Rare Pressure. - no real change with ranexa - no real change after PCI - no change on IMDUR  - the actual chest pain that he had (real pain is gone)   Dizziness is worse on metoprolol.  SOB has improved but not resolved on cardiac rehab. Lost some weight.  No LE swelling.  Notes he urinates frequently  No palpitations or cardiac syncope.    Ambulatory blood pressure 118/70s. At home      Past Medical History:  Diagnosis Date   Angina pectoris [facility])    Cataract    Exertional chest pain    Hyperlipidemia    Hypertension    Shingles 2012   Tinnitus     Past Surgical History:  Procedure Laterality Date   COLONOSCOPY     CORONARY PRESSURE/FFR STUDY N/A 04/25/2022   Procedure: INTRAVASCULAR PRESSURE WIRE/FFR STUDY;  Surgeon: Corky Crafts, MD;  Location: [facility] INVASIVE CV LAB;  Service: Cardiovascular;  Laterality: N/A;   CORONARY STENT INTERVENTION N/A  04/25/2022   Procedure: CORONARY STENT INTERVENTION;  Surgeon: Corky Crafts, MD;  Location: [facility] INVASIVE CV LAB;  Service: Cardiovascular;  Laterality: N/A;   LEFT HEART CATH AND CORONARY ANGIOGRAPHY N/A 08/16/2020   Procedure: LEFT HEART CATH AND CORONARY ANGIOGRAPHY;  Surgeon: Yvonne Kendall, MD;  Location: [facility] INVASIVE CV LAB;  Service: Cardiovascular;  Laterality: N/A;   LEFT HEART CATH AND CORONARY ANGIOGRAPHY N/A 04/25/2022   Procedure: LEFT HEART CATH AND CORONARY ANGIOGRAPHY;  Surgeon: Corky Crafts, MD;  Location: [facility] INVASIVE CV LAB;  Service: Cardiovascular;  Laterality: N/A;   ROTATOR CUFF REPAIR Bilateral    wisdom teeth      Current Medications: Current Meds  Medication Sig   alum hydroxide-mag trisilicate (GAVISCON) 80-20 MG CHEW chewable tablet Chew 2 tablets by mouth daily as needed for indigestion or heartburn.   aspirin EC 81 MG tablet Take 1 tablet (81 mg total) by mouth daily. Swallow whole.   Cyanocobalamin (B-12) 1000 MCG SUBL Place 1,000 mcg under the tongue 2 (two) times a week.   nitroGLYCERIN (NITROSTAT) 0.4 MG SL tablet Place 1 tablet (0.4 mg total) under the tongue every 5 (five) minutes as needed for chest pain.   Polyethyl Glycol-Propyl Glycol (SYSTANE OP) Place 1 drop into both eyes daily as needed (dry eyes).   rosuvastatin (CRESTOR) 40 MG tablet TAKE ONE TABLET BY MOUTH DAILY   [DISCONTINUED] clopidogrel (  PLAVIX) 75 MG tablet Take 1 tablet (75 mg total) by mouth daily.   [DISCONTINUED] metoprolol tartrate (LOPRESSOR) 25 MG tablet Take 0.5 tablets (12.5 mg total) by mouth 2 (two) times daily.     Allergies:   Patient has no known allergies.   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   Tobacco Use   Smoking status: Never   Smokeless tobacco: Never  Vaping Use   Vaping Use: Never used  Substance and Sexual Activity   Alcohol use: Yes    Comment: occasional beer   Drug use: Not Currently   Sexual activity: Not on file  Other Topics Concern   Not on file  Social History Narrative   Right handed   Lives with wife    Retired   International aid/development worker of Health   Financial Resource Strain: Not on file  Food Insecurity: No Food Insecurity (04/25/2022)   Hunger Vital Sign    Worried About Running Out of Food in the Last Year: Never true    Ran Out of Food in the Last Year: Never true  Transportation Needs: No Transportation Needs (04/25/2022)   PRAPARE - Administrator, Civil Service (Medical): No    Lack of Transportation (Non-Medical): No  Physical Activity: Not on file  Stress: Not on file  Social Connections: Not on file    Social: Micah Flesher to [facility], married; retired; went to [place] in 2022, went to [place] in 2023 [place] in 2024  Family History: The patient's family history includes COPD in his mother; Diabetes in his mother; Heart attack in his father, sister, and sister; Kidney disease in his mother. There is no history of Colon cancer, Colon polyps, Esophageal cancer, Rectal cancer, or Stomach cancer. History of coronary artery disease notable for sisters and father. History of heart failure notable for no members. History of arrhythmia notable for no members. Sister died young at the age of 23 (passed in her sleep). Brother has new ICD  ROS:   Please see the history of present illness.     All other systems reviewed and are negative.  EKGs/Labs/Other Studies Reviewed:    The following studies were reviewed today:  EKG:   3/923: SR rate 73 08/24/20: Sinus Bradycardia 1st HB 07/13/20: SR rate 69 1st Hb with Baseline artifact  Cardiac Studies & Procedures   CARDIAC CATHETERIZATION  CARDIAC CATHETERIZATION  04/25/2022  Narrative   Dist LAD lesion is 95% stenosed.  Unchanged from prior and too distal for intervention.   Ost Cx to Prox Cx lesion is 20% stenosed.   1st Diag lesion is 50% stenosed.   1st LPL lesion is 40% stenosed.   Prox LAD lesion is 50% stenosed.  RFR abnormal, 0.83 indicating significant flow reduction in this territory.   A drug-eluting stent was successfully placed using a SYNERGY XD 2.75X24, postdilated to 3.25 mm.   Post intervention, there is a 0% residual stenosis.   The left ventricular systolic function is normal.   LV end diastolic pressure is normal.   The left ventricular ejection fraction is 55-65% by visual estimate.   There is no aortic valve stenosis.  Continue dual antiplatelet therapy along with  aggressive secondary prevention.  He had typical anginal symptoms with balloon inflations.  Should be able to gradually decrease antianginal therapy starting with ranolazine.  He does still have distal LAD disease which is not amenable to PCI.  Findings Coronary Findings Diagnostic  Dominance: Co-dominant  Left Main Vessel is large. Vessel is angiographically normal.  Left Anterior Descending Vessel is large. Prox LAD lesion is 50% stenosed. The lesion is eccentric. The lesion is severely calcified. Pressure wire/FFR was performed on the lesion. RFR 0.83, positive for ischemia Dist LAD lesion is 95% stenosed.  First Diagonal Branch Vessel is small in size. 1st Diag lesion is 50% stenosed.  Second Diagonal Branch Vessel is small in size.  Third Diagonal Branch Vessel is small in size.  Ramus Intermedius Vessel is small. Vessel is angiographically normal.  Left Circumflex Vessel is large. Ost Cx to Prox Cx lesion is 20% stenosed.  First Obtuse Marginal Branch Vessel is small in size.  Second Obtuse Marginal Branch Vessel is moderate in size. The vessel exhibits minimal luminal irregularities.  Third Obtuse Marginal Branch Vessel is moderate in  size. The vessel exhibits minimal luminal irregularities.  First Left Posterolateral Branch Vessel is large in size. 1st LPL lesion is 40% stenosed.  Second Left Posterolateral Branch Vessel is small in size.  Third Left Posterolateral Branch Vessel is small in size.  Right Coronary Artery Vessel is moderate in size. There is moderate diffuse disease throughout the vessel.  Right Posterior Descending Artery Vessel is small in size.  Intervention  Prox LAD lesion Stent CATH LAUNCHER 6FR EBU3.5 guide catheter was inserted. Lesion crossed with guidewire using a GUIDEWIRE PRESSURE X 175. Pre-stent angioplasty was performed using a BALLN WOLVERINE 2.75X10. A drug-eluting stent was successfully placed using a SYNERGY XD 2.75X24. Post-stent angioplasty was performed using a BALL SAPPHIRE NC24 3.25X12. Prowater used as a Therapist, art.  Initially, a 3.0 x 15 Wolverine balloon was used to attempt to predilate.  This would not cross the tortuosity into the diseased area of the LAD.  We then used a 2.5 semicompliant balloon to predilate.  The 3.0 Wolverine balloon still would not cross.  We then changed for a 2.75 x 12 Wolverine balloon which eventually did cross into the diseased area of the proximal LAD.  Multiple inflations were done.  A 2.75 x 24 Synergy drug-eluting stent was then deployed and subsequently postdilated with a 3.25 [place] balloon. Post-Intervention Lesion Assessment The intervention was successful. Pre-interventional TIMI flow is 3. Post-intervention TIMI flow is 3. No complications occurred at this lesion. Additional compression to the forearm performed with blood pressure cuff due to concern for hematoma. There is a 0% residual stenosis post intervention.   CARDIAC CATHETERIZATION  CARDIAC CATHETERIZATION 08/16/2020  Narrative Conclusions: 1. Multivessel coronary artery disease, as detailed below.  Most severe lesion is a 95% stenosis involving the apical LAD.  There are also 50%  stenoses involving the proximal LAD and small D1 branch, as well as 40% lPL1 stenosis and moderate diffuse disease involving the RCA. 2. Low normal left ventricular systolic function (LVEF 50-55%) with mildly elevated filling pressure (LVEDP ~20 mmHg).  Recommendations: 1. Optimize medical therapy; 95% apical LAD stenosis is not well-suited for PCI given distal location and vessel size.  Will add metoprolol tartrate 25 mg twice daily. 2. Aggressive secondary prevention of coronary artery disease.  Yvonne Kendall, MD [facility]  Findings Coronary Findings Diagnostic  Dominance: Co-dominant  Left Main Vessel is large. Vessel is angiographically normal.  Left Anterior Descending Vessel is large. Prox LAD lesion is 50% stenosed. The lesion is eccentric. The lesion is severely calcified. Dist LAD lesion is 95% stenosed.  First Diagonal Branch Vessel is small in size. 1st Diag lesion is 50% stenosed.  Second Diagonal Branch Vessel is small in size.  Third Diagonal Branch Vessel is small in size.  Ramus Intermedius Vessel is small. Vessel is angiographically normal.  Left Circumflex Vessel is large. Ost Cx to Prox Cx lesion is 20% stenosed.  First Obtuse Marginal Branch Vessel is small in size.  Second Obtuse Marginal Branch Vessel is moderate in size. The vessel exhibits minimal luminal irregularities.  Third Obtuse Marginal Branch Vessel is moderate in size. The vessel exhibits minimal luminal irregularities.  First Left Posterolateral Branch Vessel is large in size. 1st LPL lesion is 40% stenosed.  Second Left Posterolateral Branch Vessel is small in size.  Third Left Posterolateral Branch Vessel is small in size.  Right Coronary Artery Vessel is moderate in size. There is moderate diffuse disease throughout the vessel.  Right Posterior Descending Artery Vessel is small in size.  Intervention  No interventions have been documented.      ECHOCARDIOGRAM  ECHOCARDIOGRAM COMPLETE 11/30/2022  Narrative ECHOCARDIOGRAM REPORT    Patient Name:   Jeffery Kirby Date of Exam: 11/30/2022 Medical Rec #:  [id number]        Height:       74.0 in Accession #:    [id number]       Weight:       187.0 lb Date of Birth:  1946-05-30         BSA:          2.112 m Patient Age:    77 years         BP:           108/64 mmHg Patient Gender: M                HR:           66 bpm. Exam Location:  [address]  Procedure: 2D Echo, Cardiac Doppler, Color Doppler, 3D Echo and Strain Analysis  Indications:    R06.02 SOB; R60.0 Lower extremity edema  History:        Patient has no prior history of Echocardiogram examinations. Risk Factors:Hypertension and Dyslipidemia.  Sonographer:    Thurman Coyer RDCS Referring Phys: [id number] [facility] A Kaison Mcparland  IMPRESSIONS   1. Left ventricular ejection fraction, by estimation, is 45 to 50%. The left ventricle has mildly decreased function. The left ventricle demonstrates global hypokinesis. Left ventricular diastolic parameters are consistent with Grade I diastolic dysfunction (impaired relaxation). 2. Right ventricular systolic function is mildly reduced. The right ventricular size is normal. There is normal pulmonary artery systolic pressure. The estimated right ventricular systolic pressure is 27.8 mmHg. 3. The mitral valve is normal in structure. Trivial mitral valve regurgitation. No evidence of mitral stenosis. 4. The aortic valve is tricuspid. There is mild calcification of the aortic valve. Aortic valve regurgitation is mild. No aortic stenosis is present. 5. Aortic dilatation noted. There is mild dilatation of the ascending aorta, measuring 42 mm. 6. The inferior vena cava is normal in size with greater than 50% respiratory variability, suggesting right atrial pressure of 3 mmHg.  FINDINGS Left Ventricle: Left ventricular ejection fraction, by estimation, is 45 to 50%. The left  ventricle has mildly decreased function. The left ventricle demonstrates global hypokinesis. The left ventricular internal cavity size was normal  in size. There is no left ventricular hypertrophy. Left ventricular diastolic parameters are consistent with Grade I diastolic dysfunction (impaired relaxation).  Right Ventricle: The right ventricular size is normal. No increase in right ventricular wall thickness. Right ventricular systolic function is mildly reduced. There is normal pulmonary artery systolic pressure. The tricuspid regurgitant velocity is 2.49 m/s, and with an assumed right atrial pressure of 3 mmHg, the estimated right ventricular systolic pressure is 27.8 mmHg.  Left Atrium: Left atrial size was normal in size.  Right Atrium: Right atrial size was normal in size.  Pericardium: There is no evidence of pericardial effusion.  Mitral Valve: The mitral valve is normal in structure. Trivial mitral valve regurgitation. No evidence of mitral valve stenosis.  Tricuspid Valve: The tricuspid valve is normal in structure. Tricuspid valve regurgitation is trivial.  Aortic Valve: The aortic valve is tricuspid. There is mild calcification of the aortic valve. Aortic valve regurgitation is mild. Aortic regurgitation PHT measures 445 msec. No aortic stenosis is present.  Pulmonic Valve: The pulmonic valve was normal in structure. Pulmonic valve regurgitation is not visualized.  Aorta: Aortic dilatation noted. There is mild dilatation of the ascending aorta, measuring 42 mm.  Venous: The inferior vena cava is normal in size with greater than 50% respiratory variability, suggesting right atrial pressure of 3 mmHg.  IAS/Shunts: No atrial level shunt detected by color flow Doppler.   LEFT VENTRICLE PLAX 2D LVIDd:         4.90 cm     Diastology LVIDs:         3.70 cm     LV e' medial:    6.20 cm/s LV PW:         1.10 cm     LV E/e' medial:  8.1 LV IVS:        0.90 cm     LV e' lateral:    6.85 cm/s LVOT diam:     2.50 cm     LV E/e' lateral: 7.3 LV SV:         82 LV SV Index:   39 LVOT Area:     4.91 cm  3D Volume EF: LV Volumes (MOD)           3D EF:        53 % LV vol d, MOD A2C: 73.5 ml LV EDV:       136 ml LV vol d, MOD A4C: 83.8 ml LV ESV:       64 ml LV vol s, MOD A2C: 36.1 ml LV SV:        72 ml LV vol s, MOD A4C: 41.6 ml LV SV MOD A2C:     37.4 ml LV SV MOD A4C:     83.8 ml LV SV MOD BP:      40.2 ml  RIGHT VENTRICLE RV Basal diam:  3.90 cm RV Mid diam:    3.00 cm RV S prime:     10.10 cm/s TAPSE (M-mode): 2.4 cm  LEFT ATRIUM             Index        RIGHT ATRIUM           Index LA diam:        3.70 cm 1.75 cm/m   RA Area:     18.10 cm LA Vol (A2C):   32.4 ml 15.34 ml/m  RA Volume:   60.10 ml  28.45 ml/m LA Vol (A4C):  35.1 ml 16.62 ml/m LA Biplane Vol: 36.2 ml 17.14 ml/m AORTIC VALVE LVOT Vmax:   72.90 cm/s LVOT Vmean:  47.600 cm/s LVOT VTI:    0.166 m AI PHT:      445 msec  AORTA Ao Root diam: 3.30 cm Ao Asc diam:  4.20 cm  MITRAL VALVE               TRICUSPID VALVE MV Area (PHT): 2.87 cm    TR Peak grad:   24.8 mmHg MV Decel Time: 264 msec    TR Vmax:        249.00 cm/s MV E velocity: 50.30 cm/s MV A velocity: 63.40 cm/s  SHUNTS MV E/A ratio:  0.79        Systemic VTI:  0.17 m Systemic Diam: 2.50 cm  Dalton McleanMD Electronically signed by Wilfred Lacy Signature Date/Time: 11/30/2022/11:15:15 AM    Final     CT SCANS  CT CORONARY MORPH W/CTA COR W/SCORE 08/10/2020  Addendum 08/10/2020 12:55 PM ADDENDUM REPORT: 08/10/2020 12:52  CLINICAL DATA:  77 Year-old White Male  EXAM: Cardiac/Coronary CTA  TECHNIQUE: The patient was scanned on a [employer].  FINDINGS: A 100 kV prospective scan was triggered in the descending thoracic aorta at 111 HU's. Axial non-contrast 3 mm slices were carried out through the heart. The data set was analyzed on a dedicated work station and scored using the Agatson method.  Gantry rotation speed was 250 msecs and collimation was .6 mm. No beta blockade and 0.8 mg of sl NTG was given. The 3D data set was reconstructed in 5% intervals of the 67-82 % of the R-R cycle. Diastolic phases were analyzed on a dedicated work station using MPR, MIP and VRT modes. The patient received 100 cc of contrast.  Aorta: Upper limits of normal; ascending aortic size 39 mm, with ascending aortic height index 2.10 cm/m. Descending aortic atherosclerosis noted. No dissection.  Aortic Valve: Tri-leaflet. Multiple small annular calcifications noted. Mild valvular calcification.  Coronary Arteries:  Normal coronary origin.  Right dominance.  Coronary calcium score of 1724. This was 90th percentile for age, sex, and race matched control.  RCA is a large dominant artery that gives rise to PDA and PLA. There is a 70-99% mixed plaque stenosis, with a subsequent 50-70% mixed plaque stenosis in the proximal vessel, there is a 50-70% mixed plaque stenosis in the mid vessel, there is a 70-90% calcified plaque stenosis in the distal vessel. In addition there are minimal non-obstructive calcified plaques scattered through the mid/distal vessel and its branches.  Left main is a large artery that gives rise to LAD and LCX arteries. The ostia of the left main appear aneurysmal measuring 16 mm with mild calcification. This narrows to 5 mm by the distal vessel. There is 30% distal stenosis with a calcified plaque with napkin ring sign (high risk feature).  LAD is a large vessel that gives off two diagonal vessels. There is an ostial 50-70% calcified stenosis narrowing to a 70-99% mixed plaque stenosis in the proximal vessel with 50-70% calcified plaque stenosis in the mid vessel. There is a mild non obstructive (25-49%) calcified plaque in the ostium of the D1 vessel. There is a mild non obstructive (25-49%) calcified plaque in the ostium of the distal vessel.  LCX is a non-dominant  artery that gives rise to four obtuse marginal vessels including a large OM4 branch that bifurcates. There is moderate (50-70%) calcified stenosis in the proximal vessel with multiple minimal  non obstructive (1-24%) calcified plaques in the mid and distant vessel. There is a minimal non obstructive (1-24%) calcified plaque in the of the OM1 vessel. There is a minimal non obstructive (1-24%) soft plaque in the of the OM3 vessel. There is a mild non obstructive (25-49%) calcified plaque in the OM4 vessel prior to the bifurcation.  Other findings:  Normal pulmonary vein drainage into the left atrium.  Normal left atrial appendage without a thrombus.  Normal size of the pulmonary artery.  Extra-cardiac findings: See attached radiology report for non-cardiac structures.  IMPRESSION: 1. Coronary calcium score of 1724. This was 90th percentile for age, sex, and race matched control.  2. Normal coronary origin with right dominance.  3. CAD-RADS 4 Severe stenosis. (70-99% or > 50% left main). There is left main, RCA, LAD, and LCX disease. Cardiac catheterization or CT FFR is recommended. Consider symptom-guided anti-ischemic pharmacotherapy as well as risk factor modification per guideline directed care.  4.  Left main appears aneurysmal: Ostial diameter measured at 16 mm.  5. Upper limits of normal ascending aortic size 39 mm, with ascending aortic height index 2.10 cm/m.  6.  Aortic atherosclerosis noted.  7.  Mild aortic valve calcification and mild annular calcification.   Electronically Signed By: Riley Lam MD On: 08/10/2020 12:52  Narrative EXAM: OVER-READ INTERPRETATION  CT CHEST  The following report is an over-read performed by radiologist Dr. Irish Lack of [facility] on 08/10/2020. This over-read does not include interpretation of cardiac or coronary anatomy or pathology. The coronary CTA interpretation by the cardiologist is  attached.  COMPARISON:  None.  FINDINGS: Vascular: No significant vascular findings. Normal heart size. No pericardial effusion.  Mediastinum/Nodes: Visualized mediastinum and hilar regions demonstrate calcified right hilar and subcarinal lymph nodes consistent with prior granulomatous disease.  Lungs/Pleura: Visualized lungs show no evidence of pulmonary edema, consolidation, pneumothorax, nodule or pleural fluid.  Upper Abdomen: No acute abnormality.  Musculoskeletal: No chest wall mass or suspicious bone lesions identified.  IMPRESSION: Calcified right hilar and subcarinal lymph nodes consistent with prior granulomatous disease.  Electronically Signed: By: Irish Lack M.D. On: 08/10/2020 11:17           Recent Labs: 04/26/2022: BUN 15; Creatinine, Ser 1.15; Hemoglobin 12.7; Platelets 175; Potassium 3.7; Sodium 138  Recent Lipid Panel    Component Value Date/Time   CHOL 116 11/23/2020 0811   TRIG 73 11/23/2020 0811   HDL 52 11/23/2020 0811   CHOLHDL 2.2 11/23/2020 0811   LDLCALC 49 11/23/2020 0811    Physical Exam:    VS:  BP 118/70   Pulse 68   Ht 6\' 1"  (1.854 m)   Wt 183 lb (83 kg)   SpO2 98%   BMI 24.14 kg/m     Wt Readings from Last 3 Encounters:  12/07/22 183 lb (83 kg)  10/16/22 187 lb (84.8 kg)  09/22/22 187 lb 2.7 oz (84.9 kg)    Gen: No distress   Neck: No JVD Cardiac: No Rubs or Gallops no systolic murmur  Respiratory: Clear to auscultation bilaterally, normal effort, normal  respiratory rate GI: Soft, nontender, non-distended  MS: trace bilateral pitting edema;  moves all extremities Integument: Skin feels war Neuro:  At time of evaluation, alert and oriented to person/place/time/situation Psych: Normal affect, patient feels well  ASSESSMENT:    1. Coronary artery disease involving native coronary artery of native heart with unstable angina pectoris (HCC)   2. Heart failure with mildly reduced  ejection fraction (HCC)   3. SOB  (shortness of breath)   4. Palpitations   5. Aortic atherosclerosis (HCC)   6. Mixed hyperlipidemia   7. Acute on chronic combined systolic and diastolic congestive heart failure (HCC)   8. Orthostatic hypotension   9. Family history of ventricular tachycardia       PLAN:    Coronary artery diease Hyperlipidemia (mixed) Aortic Atherosclerosis - s/p PCI in 04/2022; transition to ASA monotherapy - will do trial off metoprolol. He has had issues with medications causing dizziness; I do not think he is tolerating his BB very well - no improvement on ranexa or imdur in the past (PRN nitro helped his actual chest pain) - s/p Cardiac Rehab - continue current statin  Non-cardiac chest pain - his chest pressure and tightness do not appears to be cardiac and has been unchanged with our without cardiac interventions  Heart Failure Reduced Ejection Fraction (combined systolic and diastolic) FHX of VT and HF younger brother Orthostatic hypotension - acute on chronic - NYHA class II - Diuretic regimen: None- euvolemic - stop metoprolol  - he is getting a heart monitor placed; if no ectopy will start SGLT2i; if ectopy will add  - he will not tolerate ARNI; may tolerate MRA - will have him see Pharm D: questions about statin and to increase GDMT if able  Time Spent Directly with Patient:   I have spent a total of 40 minutes with the patient reviewing notes, imaging, EKGs, labs and examining the patient as well as establishing an assessment and plan that was discussed personally with the patient.  > 50% of time was spent in direct patient care.  Keep 03/09/2023 APPT        Medication Adjustments/Labs and Tests Ordered: Current medicines are reviewed at length with the patient today.  Concerns regarding medicines are outlined above.  Orders Placed This Encounter  Procedures   AMB Referral to [facility] Pharm-D   LONG TERM MONITOR (3-14 DAYS)    No orders of the defined types were  placed in this encounter.    Patient Instructions  Medication Instructions:  Your physician has recommended you make the following change in your medication:  STOP: metoprolol STOP: clopidogrel (Plavix)   *If you need a refill on your cardiac medications before your next appointment, please call your pharmacy*   Lab Work: NONE If you have labs (blood work) drawn today and your tests are completely normal, you will receive your results only by: MyChart Message (if you have MyChart) OR A paper copy in the mail If you have any lab test that is abnormal or we need to change your treatment, we will call you to review the results.   Testing/Procedures: Your physician has referred you to see our Pharmacy Clinic for Heart Failure.  Your physician has requested that you wear a heart monitor.    Follow-Up:As scheduled At [facility], you and your health needs are our priority.  As part of our continuing mission to provide you with exceptional heart care, we have created designated Provider Care Teams.  These Care Teams include your primary Cardiologist (physician) and Advanced Practice Providers (APPs -  Physician Assistants and Nurse Practitioners) who all work together to provide you with the care you need, when you need it.   OTHER INSTRUCTIONS: ZIO XT- Long Term Monitor Instructions  Your physician has requested you wear a ZIO patch monitor for 14 days.  This is a single patch monitor. [employer]  supplies one patch monitor per enrollment. Additional stickers are not available. Please do not apply patch if you will be having a Nuclear Stress Test,   Cardiac CT, MRI, or Chest Xray during the period you would be wearing the  monitor. The patch cannot be worn during these tests. You cannot remove and re-apply the  ZIO XT patch monitor.  Your ZIO patch monitor will be mailed 3 day USPS to your address on file. It may take 3-5 days  to receive your monitor after you have been  enrolled.  Once you have received your monitor, please review the enclosed instructions. Your monitor  has already been registered assigning a specific monitor serial # to you.  Billing and Patient Assistance Program Information  We have supplied [employer] with any of your insurance information on file for billing purposes. [employer] offers a sliding scale Patient Assistance Program for patients that do not have  insurance, or whose insurance does not completely cover the cost of the ZIO monitor.  You must apply for the Patient Assistance Program to qualify for this discounted rate.  To apply, please call [employer] at [phone], select option 4, select option 2, ask to apply for  Patient Assistance Program. Meredeth Ide will ask your household income, and how many people  are in your household. They will quote your out-of-pocket cost based on that information.  [employer] will also be able to set up a 41-month, interest-free payment plan if needed.  Applying the monitor   Shave hair from upper left chest.  Hold abrader disc by orange tab. Rub abrader in 40 strokes over the upper left chest as  indicated in your monitor instructions.  Clean area with 4 enclosed alcohol pads. Let dry.  Apply patch as indicated in monitor instructions. Patch will be placed under collarbone on left  side of chest with arrow pointing upward.  Rub patch adhesive wings for 2 minutes. Remove white label marked "1". Remove the white  label marked "2". Rub patch adhesive wings for 2 additional minutes.  While looking in a mirror, press and release button in center of patch. A small green light will  flash 3-4 times. This will be your only indicator that the monitor has been turned on.  Do not shower for the first 24 hours. You may shower after the first 24 hours.  Press the button if you feel a symptom. You will hear a small click. Record Date, Time and  Symptom in the Patient Logbook.  When you are ready to remove the  patch, follow instructions on the last 2 pages of Patient  Logbook. Stick patch monitor onto the last page of Patient Logbook.  Place Patient Logbook in the blue and white box. Use locking tab on box and tape box closed  securely. The blue and white box has prepaid postage on it. Please place it in the mailbox as  soon as possible. Your physician should have your test results approximately 7 days after the  monitor has been mailed back to [facility].  Call [facility] Customer Care at [phone] if you have questions regarding  your ZIO XT patch monitor. Call them immediately if you see an orange light blinking on your  monitor.  If your monitor falls off in less than 4 days, contact our Monitor department at [phone].  If your monitor becomes loose or falls off after 4 days call [employer] at [phone] for  suggestions on securing your monitor    Signed, Christell Constant,  MD  12/07/2022 12:37 PM     [facility]

## 2022-12-07 ENCOUNTER — Ambulatory Visit: Payer: Medicare Other | Attending: Internal Medicine

## 2022-12-07 ENCOUNTER — Ambulatory Visit: Payer: Medicare Other | Attending: Internal Medicine | Admitting: Internal Medicine

## 2022-12-07 ENCOUNTER — Encounter: Payer: Self-pay | Admitting: Internal Medicine

## 2022-12-07 VITALS — BP 118/70 | HR 68 | Ht 73.0 in | Wt 183.0 lb

## 2022-12-07 DIAGNOSIS — I5022 Chronic systolic (congestive) heart failure: Secondary | ICD-10-CM

## 2022-12-07 DIAGNOSIS — I5043 Acute on chronic combined systolic (congestive) and diastolic (congestive) heart failure: Secondary | ICD-10-CM

## 2022-12-07 DIAGNOSIS — R002 Palpitations: Secondary | ICD-10-CM | POA: Diagnosis not present

## 2022-12-07 DIAGNOSIS — I700 Atherosclerosis of aorta: Secondary | ICD-10-CM

## 2022-12-07 DIAGNOSIS — I10 Essential (primary) hypertension: Secondary | ICD-10-CM | POA: Diagnosis not present

## 2022-12-07 DIAGNOSIS — Z8249 Family history of ischemic heart disease and other diseases of the circulatory system: Secondary | ICD-10-CM

## 2022-12-07 DIAGNOSIS — E782 Mixed hyperlipidemia: Secondary | ICD-10-CM | POA: Diagnosis not present

## 2022-12-07 DIAGNOSIS — I25110 Atherosclerotic heart disease of native coronary artery with unstable angina pectoris: Secondary | ICD-10-CM | POA: Diagnosis not present

## 2022-12-07 DIAGNOSIS — R0602 Shortness of breath: Secondary | ICD-10-CM

## 2022-12-07 DIAGNOSIS — M7989 Other specified soft tissue disorders: Secondary | ICD-10-CM

## 2022-12-07 DIAGNOSIS — I951 Orthostatic hypotension: Secondary | ICD-10-CM

## 2022-12-07 NOTE — Progress Notes (Unsigned)
Enrolled patient for a 14 day Zio XT  monitor to be mailed to patients home  °

## 2022-12-07 NOTE — Telephone Encounter (Signed)
Pt seen by MD today 12/07/22 to address concerns.

## 2022-12-07 NOTE — Patient Instructions (Addendum)
Medication Instructions:  Your physician has recommended you make the following change in your medication:  STOP: metoprolol STOP: clopidogrel (Plavix)   *If you need a refill on your cardiac medications before your next appointment, please call your pharmacy*   Lab Work: NONE If you have labs (blood work) drawn today and your tests are completely normal, you will receive your results only by: MyChart Message (if you have MyChart) OR A paper copy in the mail If you have any lab test that is abnormal or we need to change your treatment, we will call you to review the results.   Testing/Procedures: Your physician has referred you to see our Pharmacy Clinic for Heart Failure.  Your physician has requested that you wear a heart monitor.    Follow-Up:As scheduled At [facility], you and your health needs are our priority.  As part of our continuing mission to provide you with exceptional heart care, we have created designated Provider Care Teams.  These Care Teams include your primary Cardiologist (physician) and Advanced Practice Providers (APPs -  Physician Assistants and Nurse Practitioners) who all work together to provide you with the care you need, when you need it.   OTHER INSTRUCTIONS: ZIO XT- Long Term Monitor Instructions  Your physician has requested you wear a ZIO patch monitor for 14 days.  This is a single patch monitor. [employer] supplies one patch monitor per enrollment. Additional stickers are not available. Please do not apply patch if you will be having a Nuclear Stress Test,   Cardiac CT, MRI, or Chest Xray during the period you would be wearing the  monitor. The patch cannot be worn during these tests. You cannot remove and re-apply the  ZIO XT patch monitor.  Your ZIO patch monitor will be mailed 3 day USPS to your address on file. It may take 3-5 days  to receive your monitor after you have been enrolled.  Once you have received your monitor, please  review the enclosed instructions. Your monitor  has already been registered assigning a specific monitor serial # to you.  Billing and Patient Assistance Program Information  We have supplied [employer] with any of your insurance information on file for billing purposes. [employer] offers a sliding scale Patient Assistance Program for patients that do not have  insurance, or whose insurance does not completely cover the cost of the ZIO monitor.  You must apply for the Patient Assistance Program to qualify for this discounted rate.  To apply, please call [employer] at [phone], select option 4, select option 2, ask to apply for  Patient Assistance Program. Meredeth Ide will ask your household income, and how many people  are in your household. They will quote your out-of-pocket cost based on that information.  [employer] will also be able to set up a 9-month, interest-free payment plan if needed.  Applying the monitor   Shave hair from upper left chest.  Hold abrader disc by orange tab. Rub abrader in 40 strokes over the upper left chest as  indicated in your monitor instructions.  Clean area with 4 enclosed alcohol pads. Let dry.  Apply patch as indicated in monitor instructions. Patch will be placed under collarbone on left  side of chest with arrow pointing upward.  Rub patch adhesive wings for 2 minutes. Remove white label marked "1". Remove the white  label marked "2". Rub patch adhesive wings for 2 additional minutes.  While looking in a mirror, press and release button in center of patch.  A small green light will  flash 3-4 times. This will be your only indicator that the monitor has been turned on.  Do not shower for the first 24 hours. You may shower after the first 24 hours.  Press the button if you feel a symptom. You will hear a small click. Record Date, Time and  Symptom in the Patient Logbook.  When you are ready to remove the patch, follow instructions on the last 2 pages of Patient   Logbook. Stick patch monitor onto the last page of Patient Logbook.  Place Patient Logbook in the blue and white box. Use locking tab on box and tape box closed  securely. The blue and white box has prepaid postage on it. Please place it in the mailbox as  soon as possible. Your physician should have your test results approximately 7 days after the  monitor has been mailed back to [facility].  Call [facility] Customer Care at [phone] if you have questions regarding  your ZIO XT patch monitor. Call them immediately if you see an orange light blinking on your  monitor.  If your monitor falls off in less than 4 days, contact our Monitor department at [phone].  If your monitor becomes loose or falls off after 4 days call [employer] at [phone] for  suggestions on securing your monitor

## 2022-12-10 DIAGNOSIS — R002 Palpitations: Secondary | ICD-10-CM

## 2022-12-10 DIAGNOSIS — I5022 Chronic systolic (congestive) heart failure: Secondary | ICD-10-CM | POA: Diagnosis not present

## 2022-12-10 DIAGNOSIS — R0602 Shortness of breath: Secondary | ICD-10-CM | POA: Diagnosis not present

## 2022-12-27 DIAGNOSIS — I5022 Chronic systolic (congestive) heart failure: Secondary | ICD-10-CM | POA: Diagnosis not present

## 2022-12-27 DIAGNOSIS — R002 Palpitations: Secondary | ICD-10-CM | POA: Diagnosis not present

## 2023-01-02 NOTE — Progress Notes (Signed)
Patient ID: Jeffery Kirby                 DOB: July 21, 1945                      MRN: [id number]     HPI: Jeffery Kirby is a 77 y.o. male referred by Dr. Izora Ribas to pharmacy clinic for HF medication management. PMH is significant for HTN, HLD, CAD S/P PCI 04/2022, HFmrEF. Most recent LVEF 45-50% on 11/30/2022.  Patient reported tiredness and orthostatic hypotension when he was on metoprolol and Plavix, he had stopped taking both and his symptoms are gone. Today we discussed HF medications, reasoning behind medication titration, importance of medication adherence, and patient engagement. Patient is in agreement to start SGL2i. Denies dizziness, lightheadedness, fatigue, chest pain or palpitations, SOB. Does not check his weight at home. Denies LEE,PND or orthopnea. Able to complete all ADLs. Activity level normal.he  adheres to a low-salt diet.   He does not have any problem with Crestor. His lab on file are from 2023 he has his recent lipid lab done at PCP early spring, [facility] was down so I was unable to look at the recent lipid lab. Patient will let us know via MyChart   Current lipid lowering medication: Crestor 40 mg daily Previously tried medication: none  LDLc goal< 70 mg/dl   Current CHF meds: none  Previously tried: metoprolol - dizziness  Adherence Assessment  Do you ever forget to take your medication? [] Yes [x] No  Do you ever skip doses due to side effects? [x] Yes [] No  Do you have trouble affording your medicines? [] Yes [x] No  Are you ever unable to pick up your medication due to transportation difficulties? [] Yes [x] No  Do you ever stop taking your medications because you don't believe they are helping? [x] Yes [] No  Do you check your weight daily? [] Yes [x] No   Adherence strategy: none   Barriers to obtaining medications: none   BP goal: <130/80     Social History:  Alcohol: very little twice month 1 drink  Smoking: none   Diet: low sodium diet    Exercise:  every other day ( 30 min cardio and 30 min resistance)   Home BP readings: does not check at home   Wt Readings from Last 3 Encounters:  01/03/23 183 lb 12.8 oz (83.4 kg)  12/07/22 183 lb (83 kg)  10/16/22 187 lb (84.8 kg)   BP Readings from Last 3 Encounters:  01/03/23 128/72  12/07/22 118/70  10/16/22 108/64   Pulse Readings from Last 3 Encounters:  01/03/23 77  12/07/22 68  10/16/22 74    Renal function: CrCl cannot be calculated (Patient's most recent lab result is older than the maximum 21 days allowed.).  Past Medical History:  Diagnosis Date   Angina pectoris (HCC)    Cataract    Exertional chest pain    Hyperlipidemia    Hypertension    Shingles 2012   Tinnitus     Current Outpatient Medications on File Prior to Visit  Medication Sig Dispense Refill   alum hydroxide-mag trisilicate (GAVISCON) 80-20 MG CHEW chewable tablet Chew 2 tablets by mouth daily as needed for indigestion or heartburn.     aspirin EC 81 MG tablet Take 1 tablet (81 mg total) by mouth daily. Swallow whole. 90 tablet 3   Cyanocobalamin (B-12) 1000 MCG SUBL Place 1,000 mcg under the tongue 2 (two) times a week.     nitroGLYCERIN (NITROSTAT) 0.4  MG SL tablet Place 1 tablet (0.4 mg total) under the tongue every 5 (five) minutes as needed for chest pain. 25 tablet 3   Polyethyl Glycol-Propyl Glycol (SYSTANE OP) Place 1 drop into both eyes daily as needed (dry eyes).     rosuvastatin (CRESTOR) 40 MG tablet TAKE ONE TABLET BY MOUTH DAILY 90 tablet 3   No current facility-administered medications on file prior to visit.    No Known Allergies     Heart failure with mildly reduced ejection fraction [facility]) Assessment & Plan: Assessment: Last VEF 45-50% on 11/30/2022 BP  in office BP 128/72 heart rate 77 (goal <130/80) Not on any HF medications, was on low dose metoprolol but due to dizziness and tiredness d/c Does not check weight or BP at home  Denies SOB, palpitation, chest  pain, headaches,or swelling Follow low salt diet and exercise 3-4 times per week (30 min cardio and 30 min resistance)  Discussed HF GDMT and importance of dose titration    Plan:  Start taking Jardiance 10 mg daily and get lab done on July 15  Patient to keep record of BP readings with heart rate and report to [place] at the next visit Patient to see PharmD in 4 weeks for follow up  Follow up lab(s) : BMP on July 15  Can consider adding  MRA in 4 weeks    Orders: -     Basic metabolic panel  Other hyperlipidemia Overview: Current Medications: Crestor 40 mg daily  Intolerances: none  Risk Factors: HTN, HLD, CAD S/P PCI 04/2022 LDL goal: <70 mg/dl      Assessment & Plan: Assessment:  LDL goal: < 70 mg/dl last LDLc  49  mg/dl (16/1096) Tolerates high intensity statins well without any side effects  Follows low salt low fat diet and exercises regularly     Plan: Continue taking current medications Crestor 40 mg daily  Will check lipid annually      Other orders -     Empagliflozin; Take 1 tablet (10 mg total) by mouth daily before breakfast.  Dispense: 90 tablet; Refill: 12       Thank you   Carmela Hurt, Pharm.D [facility] A Division of [facility] [address], [place], [place] [address]  Phone: [phone]; Fax: [phone]

## 2023-01-03 ENCOUNTER — Encounter: Payer: Self-pay | Admitting: Student

## 2023-01-03 ENCOUNTER — Ambulatory Visit: Payer: Medicare Other | Attending: Internal Medicine | Admitting: Student

## 2023-01-03 ENCOUNTER — Telehealth: Payer: Self-pay | Admitting: Pharmacist

## 2023-01-03 VITALS — BP 128/72 | HR 77 | Ht 74.0 in | Wt 183.8 lb

## 2023-01-03 DIAGNOSIS — I5022 Chronic systolic (congestive) heart failure: Secondary | ICD-10-CM

## 2023-01-03 DIAGNOSIS — E7849 Other hyperlipidemia: Secondary | ICD-10-CM | POA: Diagnosis not present

## 2023-01-03 MED ORDER — EMPAGLIFLOZIN 10 MG PO TABS: 10.0000 mg | ORAL_TABLET | Freq: Every day | ORAL | 12 refills | Status: DC

## 2023-01-03 NOTE — Telephone Encounter (Signed)
Spoke with patient and provided him the number for [facility] (patient assistance for Jardiance): [phone].  Patient states he will try to cal them and see if he qualifies for assistance, though he is hesitant that he will.  Patient asked about an alternative to Jardiance that is cheaper, and states that if he is unable to get assistance he will not be paying $500 for Jardiance and will not take it.  Will forward to Pharm D who saw patient today to review and advise.

## 2023-01-03 NOTE — Patient Instructions (Signed)
Changes made by your pharmacist Carmela Hurt, PharmD at today's visit:    Instructions/Changes  (what do you need to do) Your Notes  (what you did and when you did it)  Start taking Jardiance 10 mg daily    Continue with low salt diet and regular exercise    Follow up lab is due on July 15 at [facility] [address] office     Bring all of your meds, your BP cuff and your record of home blood pressures to your next appointment.    HOW TO TAKE YOUR BLOOD PRESSURE AT HOME  Rest 5 minutes before taking your blood pressure.  Don't smoke or drink caffeinated beverages for at least 30 minutes before. Take your blood pressure before (not after) you eat. Sit comfortably with your back supported and both feet on the floor (don't cross your legs). Elevate your arm to heart level on a table or a desk. Use the proper sized cuff. It should fit smoothly and snugly around your bare upper arm. There should be enough room to slip a fingertip under the cuff. The bottom edge of the cuff should be 1 inch above the crease of the elbow. Ideally, take 3 measurements at one sitting and record the average.  Important lifestyle changes to control high blood pressure  Intervention  Effect on the BP  Lose extra pounds and watch your waistline Weight loss is one of the most effective lifestyle changes for controlling blood pressure. If you're overweight or obese, losing even a small amount of weight can help reduce blood pressure. Blood pressure might go down by about 1 millimeter of mercury (mm Hg) with each kilogram (about 2.2 pounds) of weight lost.  Exercise regularly As a general goal, aim for at least 30 minutes of moderate physical activity every day. Regular physical activity can lower high blood pressure by about 5 to 8 mm Hg.  Eat a healthy diet Eating a diet rich in whole grains, fruits, vegetables, and low-fat dairy products and low in saturated fat and cholesterol. A healthy diet can lower high blood pressure by  up to 11 mm Hg.  Reduce salt (sodium) in your diet Even a small reduction of sodium in the diet can improve heart health and reduce high blood pressure by about 5 to 6 mm Hg.  Limit alcohol One drink equals 12 ounces of beer, 5 ounces of wine, or 1.5 ounces of 80-proof liquor.  Limiting alcohol to less than one drink a day for women or two drinks a day for men can help lower blood pressure by about 4 mm Hg.   If you have any questions or concerns please use My Chart to send questions or call the office at [phone]

## 2023-01-03 NOTE — Telephone Encounter (Signed)
Enrolled him in [facility] grant   Card ID : [id number]   CARD STATUS Active   BIN [id number]   PCN [id number]   PC GROUP [id number]

## 2023-01-03 NOTE — Telephone Encounter (Signed)
Pt c/o medication issue:  1. Name of Medication: empagliflozin (JARDIANCE) 10 MG TABS tablet   2. How are you currently taking this medication (dosage and times per day)?   3. Are you having a reaction (difficulty breathing--STAT)?   4. What is your medication issue? Patient is calling stating this med will be over $500 and he would like to know if there is any type of assistance or other option for this medication. Please advise.

## 2023-01-03 NOTE — Assessment & Plan Note (Signed)
Assessment:  LDL goal: < 70 mg/dl last LDLc  49  mg/dl (86/5784) Tolerates high intensity statins well without any side effects  Follows low salt low fat diet and exercises regularly     Plan: Continue taking current medications Crestor 40 mg daily  Will check lipid annually

## 2023-01-03 NOTE — Assessment & Plan Note (Signed)
Assessment: Last VEF 45-50% on 11/30/2022 BP  in office BP 128/72 heart rate 77 (goal <130/80) Not on any HF medications, was on low dose metoprolol but due to dizziness and tiredness d/c Does not check weight or BP at home  Denies SOB, palpitation, chest pain, headaches,or swelling Follow low salt diet and exercise 3-4 times per week (30 min cardio and 30 min resistance)  Discussed HF GDMT and importance of dose titration    Plan:  Start taking Jardiance 10 mg daily and get lab done on July 15  Patient to keep record of BP readings with heart rate and report to [place] at the next visit Patient to see PharmD in 4 weeks for follow up  Follow up lab(s) : BMP on July 15  Can consider adding  MRA in 4 weeks

## 2023-01-12 NOTE — Telephone Encounter (Signed)
Call to address S/E concern N/A LVM.

## 2023-01-15 ENCOUNTER — Ambulatory Visit: Payer: Medicare Other | Attending: Cardiovascular Disease

## 2023-01-15 DIAGNOSIS — I5022 Chronic systolic (congestive) heart failure: Secondary | ICD-10-CM | POA: Diagnosis not present

## 2023-01-16 LAB — BASIC METABOLIC PANEL
BUN/Creatinine Ratio: 14 (ref 10–24)
BUN: 14 mg/dL (ref 8–27)
CO2: 24 mmol/L (ref 20–29)
Calcium: 9.5 mg/dL (ref 8.6–10.2)
Chloride: 105 mmol/L (ref 96–106)
Creatinine, Ser: 0.97 mg/dL (ref 0.76–1.27)
Glucose: 78 mg/dL (ref 70–99)
Potassium: 4.9 mmol/L (ref 3.5–5.2)
Sodium: 143 mmol/L (ref 134–144)
eGFR: 81 mL/min/{1.73_m2} (ref >59)

## 2023-01-17 NOTE — Telephone Encounter (Signed)
Addressed potential s/e concerns. Patient is willing to start Jardiance 10 mg daily and will repeat BMP in 2 weeks.

## 2023-01-30 DIAGNOSIS — I209 Angina pectoris, unspecified: Secondary | ICD-10-CM | POA: Diagnosis not present

## 2023-01-30 DIAGNOSIS — G250 Essential tremor: Secondary | ICD-10-CM | POA: Diagnosis not present

## 2023-01-30 DIAGNOSIS — R9089 Other abnormal findings on diagnostic imaging of central nervous system: Secondary | ICD-10-CM | POA: Diagnosis not present

## 2023-01-30 DIAGNOSIS — I5022 Chronic systolic (congestive) heart failure: Secondary | ICD-10-CM | POA: Diagnosis not present

## 2023-01-30 DIAGNOSIS — R42 Dizziness and giddiness: Secondary | ICD-10-CM | POA: Diagnosis not present

## 2023-01-30 DIAGNOSIS — G3184 Mild cognitive impairment, so stated: Secondary | ICD-10-CM | POA: Diagnosis not present

## 2023-01-30 DIAGNOSIS — G20C Parkinsonism, unspecified: Secondary | ICD-10-CM | POA: Diagnosis not present

## 2023-01-30 DIAGNOSIS — I110 Hypertensive heart disease with heart failure: Secondary | ICD-10-CM | POA: Diagnosis not present

## 2023-01-30 DIAGNOSIS — I700 Atherosclerosis of aorta: Secondary | ICD-10-CM | POA: Diagnosis not present

## 2023-01-30 DIAGNOSIS — I2510 Atherosclerotic heart disease of native coronary artery without angina pectoris: Secondary | ICD-10-CM | POA: Diagnosis not present

## 2023-01-30 DIAGNOSIS — R7301 Impaired fasting glucose: Secondary | ICD-10-CM | POA: Diagnosis not present

## 2023-01-30 DIAGNOSIS — E785 Hyperlipidemia, unspecified: Secondary | ICD-10-CM | POA: Diagnosis not present

## 2023-01-31 ENCOUNTER — Ambulatory Visit: Payer: Medicare Other | Attending: Internal Medicine | Admitting: Pharmacist

## 2023-01-31 VITALS — BP 114/70 | HR 73 | Wt 184.0 lb

## 2023-01-31 DIAGNOSIS — I5022 Chronic systolic (congestive) heart failure: Secondary | ICD-10-CM | POA: Diagnosis not present

## 2023-01-31 NOTE — Patient Instructions (Addendum)
Continue taking your current medications  Call Aundra Millet, PharmD with any concerns at #[phone]. Otherwise, I'll give you a call in 2 weeks to see how you're doing on the [place]

## 2023-01-31 NOTE — Progress Notes (Signed)
Patient ID: Jeffery Kirby                 DOB: [birth year]                      MRN: [id number]     HPI: Jeffery Kirby is a 77 y.o. male referred by Dr. Izora Ribas to pharmacy clinic for HF medication management. PMH is significant for CAD s/p PCI to LAD in 04/2022, HFmrEF, HTN, HLD, and palpitations. Most recent LVEF 45-50% on 11/30/22 echo. He was experiencing dizziness on metoprolol which was stopped at 12/07/22 MD appt along with Plavix.   Saw PharmD on 01/03/23, fatigue and dizziness had improved by at least 50%. He was started on Jardiance. Reported copay was > $500 and was enrolled in [facility]. Had questions about side effects of med, started it on 7/20.  Today reports that dizziness and lightheadedness have worsened again since starting Jardiance, back to a similar degree to when he was taking metoprolol. Feels dizzy with most positional changes. 2 prior falls back when he was taking metoprolol, none since then. Brings in a detailed log of home BP readings. BP when on Jardiance: 117/72, 129/69, 119/70, 120/66, 104/62, 94/61, 119/71, 107/65, 111/63, 113/67, 116/63, 124/74, 104/61, HR 69-85. BP before starting Jardiance (and after stopping metoprolol): 121/73, 119/73, 114/66, 115/65, 129/73, 116/69, 127/69, 121/69, 112/66, 119/65, 115/67, 108/63, 115/72, 111/66, 111/71, 136/72.   Home cuff measuring accurately today, 129/74 initially, 113/77 on recheck. Saw PCP yesterday, BP was 124/74.   Current CHF meds: Jardiance 10mg  daily Previously tried: metoprolol - dizziness BP goal: <130/31mmHg  Family History: COPD in his mother; Diabetes in his mother; Heart attack in his father, sister, and sister; Kidney disease in his mother. There is no history of Colon cancer, Colon polyps, Esophageal cancer, Rectal cancer, or Stomach cancer. History of coronary artery disease notable for sisters and father.  Social History: Occasional beer, no drug or tobacco use.  Diet: low  sodium  Exercise: Every other day (30 mins cardio and 30 mins resistance)  Wt Readings from Last 3 Encounters:  01/03/23 183 lb 12.8 oz (83.4 kg)  12/07/22 183 lb (83 kg)  10/16/22 187 lb (84.8 kg)   BP Readings from Last 3 Encounters:  01/03/23 128/72  12/07/22 118/70  10/16/22 108/64   Pulse Readings from Last 3 Encounters:  01/03/23 77  12/07/22 68  10/16/22 74    Renal function: CrCl cannot be calculated (Unknown ideal weight.).  Past Medical History:  Diagnosis Date   Angina pectoris (HCC)    Cataract    Exertional chest pain    Hyperlipidemia    Hypertension    Shingles 2012   Tinnitus     Current Outpatient Medications on File Prior to Visit  Medication Sig Dispense Refill   alum hydroxide-mag trisilicate (GAVISCON) 80-20 MG CHEW chewable tablet Chew 2 tablets by mouth daily as needed for indigestion or heartburn.     aspirin EC 81 MG tablet Take 1 tablet (81 mg total) by mouth daily. Swallow whole. 90 tablet 3   Cyanocobalamin (B-12) 1000 MCG SUBL Place 1,000 mcg under the tongue 2 (two) times a week.     empagliflozin (JARDIANCE) 10 MG TABS tablet Take 1 tablet (10 mg total) by mouth daily before breakfast. 90 tablet 12   nitroGLYCERIN (NITROSTAT) 0.4 MG SL tablet Place 1 tablet (0.4 mg total) under the tongue every 5 (five) minutes as needed for chest pain. 25 tablet 3  Polyethyl Glycol-Propyl Glycol (SYSTANE OP) Place 1 drop into both eyes daily as needed (dry eyes).     rosuvastatin (CRESTOR) 40 MG tablet TAKE ONE TABLET BY MOUTH DAILY 90 tablet 3   No current facility-administered medications on file prior to visit.    No Known Allergies   Assessment/Plan:  1. HFmrEF - BP is well controlled, slightly lower since starting Jardiance. Dizziness has increased again, feels similar to when he took metoprolol. Has been on med about 10 days so far. Discussed CV benefit behind using med including reduction in HF hospitalizations/death seen in [facility]  trial. Pt wishes to stay on Jardiance a bit longer to see if dizziness improves. I'll call him in 2 weeks to touch base. He goes to [place] in about 3 weeks, discussed that he can stop med if needed, want to avoid dizziness/fall risk especially if traveling abroad.   E. , PharmD, BCACP, CPP [facility] [address] [place], [place] [address] Phone: [phone]; Fax: [phone] 01/31/2023 11:45 AM

## 2023-02-14 ENCOUNTER — Telehealth: Payer: Self-pay | Admitting: Pharmacist

## 2023-02-14 NOTE — Telephone Encounter (Addendum)
Called pt to follow up with Jardiance tolerability. Feels like it does help but is feeling more dizzy on the medication. Will have pt stop med while he's out of the country for the next month. I'll call him in mid September when he's back in town for an update on his symptoms to see if it's worth retrying the [place] again. Will leave med on his list for now.

## 2023-02-22 ENCOUNTER — Other Ambulatory Visit: Payer: Self-pay | Admitting: Nurse Practitioner

## 2023-03-12 NOTE — Progress Notes (Signed)
Cardiology Office Note:    Date:  03/19/2023   ID:  Jeffery Kirby, DOB 02-Sep-1945, MRN [id number]  PCP:  Cleatis Polka., MD  [facility] Cardiologist: Riley Lam MD [facility] Electrophysiologist:  None   CC: CAD f/u  History of Present Illness:    Jeffery Kirby is a 77 y.o. male with a HTN and HLD who presented for evaluation 07/13/20.  2022: Cardiac CT which showed multi-vessel disease; patient NFA:[id number] non obstructive disease save for with obstructive apical disease. With medical therapy, lightheaded on BB.   2023: started dietary changes for CAD secondary prevention.  Had worsening CP.  R Radial approach PCI of LAD.  He had improvement in angina but notable right arm pain.   Saw [facility]VS.  Saw APP in 05/03/22. 2024: Still having dizziness.  Completed cardiac rehab.  Worsening SOB: EF mildly decreased.  They are going to [place] in 2 months  Jeffery Kirby, a 77 year old male with a history of hypertension, hyperlipidemia, and nonobstructive coronary arteries that progressed to obstructive in 2023, presents with ongoing chest pain, tightness, and pressure. These symptoms have not improved despite a percutaneous coronary intervention (PCI) of his left anterior descending artery (LAD) and various medication trials. He reports that he has good and bad days, with the bad days characterized by increased chest symptoms and fatigue.  The patient has tried several medications, including Ranexa, isosorbide mononitrate, beta blockers, and an SGLT2 inhibitor (Jardiance), but has had difficulty tolerating them due to side effects, particularly dizziness and shortness of breath. He reports that these side effects seem to enhance the symptoms he already has. He has been off these medications and reports feeling much better.  In addition to his cardiac symptoms, the patient also reports balance issues. He has had instances where he leans to the left when he gets tired, and has had at  least one fall resulting in a black eye. He also reports intermittent episodes of tiredness and shortness of breath that do not seem to correlate with his activity level.   Past Medical History:  Diagnosis Date   Angina pectoris (HCC)    Cataract    Exertional chest pain    Hyperlipidemia    Hypertension    Shingles 2012   Tinnitus     Past Surgical History:  Procedure Laterality Date   COLONOSCOPY     CORONARY PRESSURE/FFR STUDY N/A 04/25/2022   Procedure: INTRAVASCULAR PRESSURE WIRE/FFR STUDY;  Surgeon: Corky Crafts, MD;  Location: [facility] INVASIVE CV LAB;  Service: Cardiovascular;  Laterality: N/A;   CORONARY STENT INTERVENTION N/A 04/25/2022   Procedure: CORONARY STENT INTERVENTION;  Surgeon: Corky Crafts, MD;  Location: [facility] INVASIVE CV LAB;  Service: Cardiovascular;  Laterality: N/A;   LEFT HEART CATH AND CORONARY ANGIOGRAPHY N/A 08/16/2020   Procedure: LEFT HEART CATH AND CORONARY ANGIOGRAPHY;  Surgeon: Yvonne Kendall, MD;  Location: [facility] INVASIVE CV LAB;  Service: Cardiovascular;  Laterality: N/A;   LEFT HEART CATH AND CORONARY ANGIOGRAPHY N/A 04/25/2022   Procedure: LEFT HEART CATH AND CORONARY ANGIOGRAPHY;  Surgeon: Corky Crafts, MD;  Location: [facility] INVASIVE CV LAB;  Service: Cardiovascular;  Laterality: N/A;   ROTATOR CUFF REPAIR Bilateral    wisdom teeth      Current Medications: Current Meds  Medication Sig   aspirin EC 81 MG tablet Take 1 tablet (81 mg total) by mouth daily. Swallow whole.   Cyanocobalamin (B-12) 1000 MCG SUBL Place 1,000 mcg under the tongue 2 (two) times a week.  empagliflozin (JARDIANCE) 10 MG TABS tablet Take 1 tablet (10 mg total) by mouth daily before breakfast.   nitroGLYCERIN (NITROSTAT) 0.4 MG SL tablet Place 1 tablet (0.4 mg total) under the tongue every 5 (five) minutes as needed for chest pain.   rosuvastatin (CRESTOR) 40 MG tablet TAKE ONE TABLET BY MOUTH DAILY     Allergies:   Patient has no known allergies.   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  Tobacco Use   Smoking status: Never   Smokeless tobacco: Never  Vaping Use   Vaping status: Never Used  Substance and Sexual Activity   Alcohol use: Yes    Comment: occasional beer   Drug use: Not Currently   Sexual activity: Not on file  Other Topics Concern   Not on file  Social History Narrative   Right handed   Lives with wife    Retired   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 Strain: Not on file  Food Insecurity: No Food Insecurity (04/25/2022)   Hunger Vital Sign    Worried About Running Out of Food in the Last Year: Never true    Ran Out of Food in the Last Year: Never true  Transportation Needs: No Transportation Needs (04/25/2022)   PRAPARE - Administrator, Civil Service (Medical): No    Lack of Transportation (Non-Medical): No  Physical Activity: Not on file  Stress: Not on file  Social Connections: Not on file    Social: Micah Flesher to [facility], married; retired; went to [place] in 2022, went to [place] in 2023 [place] in 2024, the go all over  Family History: The patient's family history includes COPD in his mother; Diabetes in his mother; Heart attack in his father, sister, and sister; Kidney disease in his mother. There is no history of Colon cancer, Colon polyps, Esophageal cancer, Rectal cancer, or Stomach cancer. History of coronary artery disease notable for sisters and father. History of heart failure notable for no members. History of arrhythmia notable for no members. Sister died young at the age of 6 (passed in her sleep). Brother has new ICD  ROS:   Please see the history of present illness.      EKGs/Labs/Other Studies  Reviewed:    The following studies were reviewed today:  EKG:   3/923: SR rate 73 08/24/20: Sinus Bradycardia 1st HB 07/13/20: SR rate 69 1st Hb with Baseline artifact  Cardiac Studies & Procedures   CARDIAC CATHETERIZATION  CARDIAC CATHETERIZATION 04/25/2022  Narrative   Dist LAD lesion is 95% stenosed.  Unchanged from prior and too distal for intervention.   Ost Cx to Prox Cx lesion is 20% stenosed.   1st Diag lesion is 50% stenosed.   1st LPL lesion is 40% stenosed.   Prox LAD lesion is 50% stenosed.  RFR abnormal, 0.83 indicating significant flow reduction in this territory.   A drug-eluting stent was successfully placed using a SYNERGY XD 2.75X24, postdilated to 3.25 mm.   Post intervention, there is a 0% residual stenosis.   The left ventricular systolic function is normal.   LV end diastolic pressure is normal.   The left ventricular ejection fraction is  55-65% by visual estimate.   There is no aortic valve stenosis.  Continue dual antiplatelet therapy along with aggressive secondary prevention.  He had typical anginal symptoms with balloon inflations.  Should be able to gradually decrease antianginal therapy starting with ranolazine.  He does still have distal LAD disease which is not amenable to PCI.  Findings Coronary Findings Diagnostic  Dominance: Co-dominant  Left Main Vessel is large. Vessel is angiographically normal.  Left Anterior Descending Vessel is large. Prox LAD lesion is 50% stenosed. The lesion is eccentric. The lesion is severely calcified. Pressure wire/FFR was performed on the lesion. RFR 0.83, positive for ischemia Dist LAD lesion is 95% stenosed.  First Diagonal Branch Vessel is small in size. 1st Diag lesion is 50% stenosed.  Second Diagonal Branch Vessel is small in size.  Third Diagonal Branch Vessel is small in size.  Ramus Intermedius Vessel is small. Vessel is angiographically normal.  Left Circumflex Vessel is large. Ost Cx to  Prox Cx lesion is 20% stenosed.  First Obtuse Marginal Branch Vessel is small in size.  Second Obtuse Marginal Branch Vessel is moderate in size. The vessel exhibits minimal luminal irregularities.  Third Obtuse Marginal Branch Vessel is moderate in size. The vessel exhibits minimal luminal irregularities.  First Left Posterolateral Branch Vessel is large in size. 1st LPL lesion is 40% stenosed.  Second Left Posterolateral Branch Vessel is small in size.  Third Left Posterolateral Branch Vessel is small in size.  Right Coronary Artery Vessel is moderate in size. There is moderate diffuse disease throughout the vessel.  Right Posterior Descending Artery Vessel is small in size.  Intervention  Prox LAD lesion Stent CATH LAUNCHER 6FR EBU3.5 guide catheter was inserted. Lesion crossed with guidewire using a GUIDEWIRE PRESSURE X 175. Pre-stent angioplasty was performed using a BALLN WOLVERINE 2.75X10. A drug-eluting stent was successfully placed using a SYNERGY XD 2.75X24. Post-stent angioplasty was performed using a BALL SAPPHIRE NC24 3.25X12. [place] used as a Therapist, art.  Initially, a 3.0 x 15 Wolverine balloon was used to attempt to predilate.  This would not cross the tortuosity into the diseased area of the LAD.  We then used a 2.5 semicompliant balloon to predilate.  The 3.0 Wolverine balloon still would not cross.  We then changed for a 2.75 x 12 Wolverine balloon which eventually did cross into the diseased area of the proximal LAD.  Multiple inflations were done.  A 2.75 x 24 Synergy drug-eluting stent was then deployed and subsequently postdilated with a 3.25 [place] balloon. Post-Intervention Lesion Assessment The intervention was successful. Pre-interventional TIMI flow is 3. Post-intervention TIMI flow is 3. No complications occurred at this lesion. Additional compression to the forearm performed with blood pressure cuff due to concern for hematoma. There is a 0% residual  stenosis post intervention.   CARDIAC CATHETERIZATION  CARDIAC CATHETERIZATION 08/16/2020  Narrative Conclusions: 1. Multivessel coronary artery disease, as detailed below.  Most severe lesion is a 95% stenosis involving the apical LAD.  There are also 50% stenoses involving the proximal LAD and small D1 branch, as well as 40% lPL1 stenosis and moderate diffuse disease involving the RCA. 2. Low normal left ventricular systolic function (LVEF 50-55%) with mildly elevated filling pressure (LVEDP ~20 mmHg).  Recommendations: 1. Optimize medical therapy; 95% apical LAD stenosis is not well-suited for PCI given distal location and vessel size.  Will add metoprolol tartrate 25 mg twice daily. 2. Aggressive secondary prevention of coronary artery disease.  Yvonne Kendall, MD [facility]  Findings Coronary Findings Diagnostic  Dominance: Co-dominant  Left Main Vessel is large. Vessel is angiographically normal.  Left Anterior Descending Vessel is large. Prox LAD lesion is 50% stenosed. The lesion is eccentric. The lesion is severely calcified. Dist LAD lesion is 95% stenosed.  First Diagonal Branch Vessel is small in size. 1st Diag lesion is 50% stenosed.  Second Diagonal Branch Vessel is small in size.  Third Diagonal Branch Vessel is small in size.  Ramus Intermedius Vessel is small. Vessel is angiographically normal.  Left Circumflex Vessel is large. Ost Cx to Prox Cx lesion is 20% stenosed.  First Obtuse Marginal Branch Vessel is small in size.  Second Obtuse Marginal Branch Vessel is moderate in size. The vessel exhibits minimal luminal irregularities.  Third Obtuse Marginal Branch Vessel is moderate in size. The vessel exhibits minimal luminal irregularities.  First Left Posterolateral Branch Vessel is large in size. 1st LPL lesion is 40% stenosed.  Second Left Posterolateral Branch Vessel is small in size.  Third Left Posterolateral Branch Vessel is  small in size.  Right Coronary Artery Vessel is moderate in size. There is moderate diffuse disease throughout the vessel.  Right Posterior Descending Artery Vessel is small in size.  Intervention  No interventions have been documented.     ECHOCARDIOGRAM  ECHOCARDIOGRAM COMPLETE 11/30/2022  Narrative ECHOCARDIOGRAM REPORT    Patient Name:   Jeffery Kirby Date of Exam: 11/30/2022 Medical Rec #:  [id number]        Height:       74.0 in Accession #:    [id number]       Weight:       187.0 lb Date of Birth:  Nov 14, 1945         BSA:          2.112 m Patient Age:    77 years         BP:           108/64 mmHg Patient Gender: M                HR:           66 bpm. Exam Location:  [address]  Procedure: 2D Echo, Cardiac Doppler, Color Doppler, 3D Echo and Strain Analysis  Indications:    R06.02 SOB; R60.0 Lower extremity edema  History:        Patient has no prior history of Echocardiogram examinations. Risk Factors:Hypertension and Dyslipidemia.  Sonographer:    Thurman Coyer RDCS Referring Phys: [id number] [facility] A Estoria Geary  IMPRESSIONS   1. Left ventricular ejection fraction, by estimation, is 45 to 50%. The left ventricle has mildly decreased function. The left ventricle demonstrates global hypokinesis. Left ventricular diastolic parameters are consistent with Grade I diastolic dysfunction (impaired relaxation). 2. Right ventricular systolic function is mildly reduced. The right ventricular size is normal. There is normal pulmonary artery systolic pressure. The estimated right ventricular systolic pressure is 27.8 mmHg. 3. The mitral valve is normal in structure. Trivial mitral valve regurgitation. No evidence of mitral stenosis. 4. The aortic valve is tricuspid. There is mild calcification of the aortic valve. Aortic valve regurgitation is mild. No aortic stenosis is present. 5. Aortic dilatation noted. There is mild dilatation of the ascending aorta, measuring 42  mm. 6. The inferior vena cava is normal in size with greater than 50% respiratory variability, suggesting right atrial pressure of 3 mmHg.  FINDINGS Left Ventricle: Left ventricular ejection fraction, by estimation, is 45 to 50%. The left ventricle  has mildly decreased function. The left ventricle demonstrates global hypokinesis. The left ventricular internal cavity size was normal in size. There is no left ventricular hypertrophy. Left ventricular diastolic parameters are consistent with Grade I diastolic dysfunction (impaired relaxation).  Right Ventricle: The right ventricular size is normal. No increase in right ventricular wall thickness. Right ventricular systolic function is mildly reduced. There is normal pulmonary artery systolic pressure. The tricuspid regurgitant velocity is 2.49 m/s, and with an assumed right atrial pressure of 3 mmHg, the estimated right ventricular systolic pressure is 27.8 mmHg.  Left Atrium: Left atrial size was normal in size.  Right Atrium: Right atrial size was normal in size.  Pericardium: There is no evidence of pericardial effusion.  Mitral Valve: The mitral valve is normal in structure. Trivial mitral valve regurgitation. No evidence of mitral valve stenosis.  Tricuspid Valve: The tricuspid valve is normal in structure. Tricuspid valve regurgitation is trivial.  Aortic Valve: The aortic valve is tricuspid. There is mild calcification of the aortic valve. Aortic valve regurgitation is mild. Aortic regurgitation PHT measures 445 msec. No aortic stenosis is present.  Pulmonic Valve: The pulmonic valve was normal in structure. Pulmonic valve regurgitation is not visualized.  Aorta: Aortic dilatation noted. There is mild dilatation of the ascending aorta, measuring 42 mm.  Venous: The inferior vena cava is normal in size with greater than 50% respiratory variability, suggesting right atrial pressure of 3 mmHg.  IAS/Shunts: No atrial level shunt detected  by color flow Doppler.   LEFT VENTRICLE PLAX 2D LVIDd:         4.90 cm     Diastology LVIDs:         3.70 cm     LV e' medial:    6.20 cm/s LV PW:         1.10 cm     LV E/e' medial:  8.1 LV IVS:        0.90 cm     LV e' lateral:   6.85 cm/s LVOT diam:     2.50 cm     LV E/e' lateral: 7.3 LV SV:         82 LV SV Index:   39 LVOT Area:     4.91 cm  3D Volume EF: LV Volumes (MOD)           3D EF:        53 % LV vol d, MOD A2C: 73.5 ml LV EDV:       136 ml LV vol d, MOD A4C: 83.8 ml LV ESV:       64 ml LV vol s, MOD A2C: 36.1 ml LV SV:        72 ml LV vol s, MOD A4C: 41.6 ml LV SV MOD A2C:     37.4 ml LV SV MOD A4C:     83.8 ml LV SV MOD BP:      40.2 ml  RIGHT VENTRICLE RV Basal diam:  3.90 cm RV Mid diam:    3.00 cm RV S prime:     10.10 cm/s TAPSE (M-mode): 2.4 cm  LEFT ATRIUM             Index        RIGHT ATRIUM           Index LA diam:        3.70 cm 1.75 cm/m   RA Area:     18.10 cm LA Vol (A2C):   32.4  ml 15.34 ml/m  RA Volume:   60.10 ml  28.45 ml/m LA Vol (A4C):   35.1 ml 16.62 ml/m LA Biplane Vol: 36.2 ml 17.14 ml/m AORTIC VALVE LVOT Vmax:   72.90 cm/s LVOT Vmean:  47.600 cm/s LVOT VTI:    0.166 m AI PHT:      445 msec  AORTA Ao Root diam: 3.30 cm Ao Asc diam:  4.20 cm  MITRAL VALVE               TRICUSPID VALVE MV Area (PHT): 2.87 cm    TR Peak grad:   24.8 mmHg MV Decel Time: 264 msec    TR Vmax:        249.00 cm/s MV E velocity: 50.30 cm/s MV A velocity: 63.40 cm/s  SHUNTS MV E/A ratio:  0.79        Systemic VTI:  0.17 m Systemic Diam: 2.50 cm  Dalton McleanMD Electronically signed by Wilfred Lacy Signature Date/Time: 11/30/2022/11:15:15 AM    Final    MONITORS  LONG TERM MONITOR (3-14 DAYS) 12/27/2022  Narrative   Patient had a minimum heart rate of 50 bpm, maximum heart rate of 180 bpm, and average heart rate of 73 bpm.   Predominant underlying rhythm was sinus rhythm.   Paroxysmal SVT, three episodes,  lasting 6 beats at  longest with a max rate of 135 bpm at fastest.   Isolated PACs were rare (<1.0%).   Isolated PVCs were occasional (3.0%).   Triggered and diary events associated with sinus rhythm, PACs, or PVCs.  Asymptomatic SVT.   CT SCANS  CT CORONARY MORPH W/CTA COR W/SCORE 08/10/2020  Addendum 08/10/2020 12:55 PM ADDENDUM REPORT: 08/10/2020 12:52  CLINICAL DATA:  77 Year-old White Male  EXAM: Cardiac/Coronary CTA  TECHNIQUE: The patient was scanned on a [employer].  FINDINGS: A 100 kV prospective scan was triggered in the descending thoracic aorta at 111 HU's. Axial non-contrast 3 mm slices were carried out through the heart. The data set was analyzed on a dedicated work station and scored using the Agatson method. Gantry rotation speed was 250 msecs and collimation was .6 mm. No beta blockade and 0.8 mg of sl NTG was given. The 3D data set was reconstructed in 5% intervals of the 67-82 % of the R-R cycle. Diastolic phases were analyzed on a dedicated work station using MPR, MIP and VRT modes. The patient received 100 cc of contrast.  Aorta: Upper limits of normal; ascending aortic size 39 mm, with ascending aortic height index 2.10 cm/m. Descending aortic atherosclerosis noted. No dissection.  Aortic Valve: Tri-leaflet. Multiple small annular calcifications noted. Mild valvular calcification.  Coronary Arteries:  Normal coronary origin.  Right dominance.  Coronary calcium score of 1724. This was 90th percentile for age, sex, and race matched control.  RCA is a large dominant artery that gives rise to PDA and PLA. There is a 70-99% mixed plaque stenosis, with a subsequent 50-70% mixed plaque stenosis in the proximal vessel, there is a 50-70% mixed plaque stenosis in the mid vessel, there is a 70-90% calcified plaque stenosis in the distal vessel. In addition there are minimal non-obstructive calcified plaques scattered through the mid/distal vessel and its  branches.  Left main is a large artery that gives rise to LAD and LCX arteries. The ostia of the left main appear aneurysmal measuring 16 mm with mild calcification. This narrows to 5 mm by the distal vessel. There is 30% distal stenosis with a calcified plaque  with napkin ring sign (high risk feature).  LAD is a large vessel that gives off two diagonal vessels. There is an ostial 50-70% calcified stenosis narrowing to a 70-99% mixed plaque stenosis in the proximal vessel with 50-70% calcified plaque stenosis in the mid vessel. There is a mild non obstructive (25-49%) calcified plaque in the ostium of the D1 vessel. There is a mild non obstructive (25-49%) calcified plaque in the ostium of the distal vessel.  LCX is a non-dominant artery that gives rise to four obtuse marginal vessels including a large OM4 branch that bifurcates. There is moderate (50-70%) calcified stenosis in the proximal vessel with multiple minimal non obstructive (1-24%) calcified plaques in the mid and distant vessel. There is a minimal non obstructive (1-24%) calcified plaque in the of the OM1 vessel. There is a minimal non obstructive (1-24%) soft plaque in the of the OM3 vessel. There is a mild non obstructive (25-49%) calcified plaque in the OM4 vessel prior to the bifurcation.  Other findings:  Normal pulmonary vein drainage into the left atrium.  Normal left atrial appendage without a thrombus.  Normal size of the pulmonary artery.  Extra-cardiac findings: See attached radiology report for non-cardiac structures.  IMPRESSION: 1. Coronary calcium score of 1724. This was 90th percentile for age, sex, and race matched control.  2. Normal coronary origin with right dominance.  3. CAD-RADS 4 Severe stenosis. (70-99% or > 50% left main). There is left main, RCA, LAD, and LCX disease. Cardiac catheterization or CT FFR is recommended. Consider symptom-guided anti-ischemic pharmacotherapy as well as  risk factor modification per guideline directed care.  4.  Left main appears aneurysmal: Ostial diameter measured at 16 mm.  5. Upper limits of normal ascending aortic size 39 mm, with ascending aortic height index 2.10 cm/m.  6.  Aortic atherosclerosis noted.  7.  Mild aortic valve calcification and mild annular calcification.   Electronically Signed By: Riley Lam MD On: 08/10/2020 12:52  Narrative EXAM: OVER-READ INTERPRETATION  CT CHEST  The following report is an over-read performed by radiologist Dr. Irish Lack of [facility] on 08/10/2020. This over-read does not include interpretation of cardiac or coronary anatomy or pathology. The coronary CTA interpretation by the cardiologist is attached.  COMPARISON:  None.  FINDINGS: Vascular: No significant vascular findings. Normal heart size. No pericardial effusion.  Mediastinum/Nodes: Visualized mediastinum and hilar regions demonstrate calcified right hilar and subcarinal lymph nodes consistent with prior granulomatous disease.  Lungs/Pleura: Visualized lungs show no evidence of pulmonary edema, consolidation, pneumothorax, nodule or pleural fluid.  Upper Abdomen: No acute abnormality.  Musculoskeletal: No chest wall mass or suspicious bone lesions identified.  IMPRESSION: Calcified right hilar and subcarinal lymph nodes consistent with prior granulomatous disease.  Electronically Signed: By: Irish Lack M.D. On: 08/10/2020 11:17           Recent Labs: 04/26/2022: Hemoglobin 12.7; Platelets 175 01/15/2023: BUN 14; Creatinine, Ser 0.97; Potassium 4.9; Sodium 143  Recent Lipid Panel    Component Value Date/Time   CHOL 116 11/23/2020 0811   TRIG 73 11/23/2020 0811   HDL 52 11/23/2020 0811   CHOLHDL 2.2 11/23/2020 0811   LDLCALC 49 11/23/2020 0811    Physical Exam:    VS:  BP 120/70   Pulse 86   Ht 6\' 1"  (1.854 m)   Wt 182 lb 9.6 oz (82.8 kg)   SpO2 96%   BMI  24.09 kg/m     Wt Readings from Last 3  Encounters:  03/19/23 182 lb 9.6 oz (82.8 kg)  01/31/23 184 lb (83.5 kg)  01/03/23 183 lb 12.8 oz (83.4 kg)    Gen: No distress   Neck: No JVD Cardiac: No Rubs or Gallops no murmur  Respiratory: Clear to auscultation bilaterally, normal effort, normal  respiratory rate GI: Soft, nontender, non-distended  MS: trace bilateral pitting edema;  moves all extremities Integument: Skin feels warm Neuro:  At time of evaluation, alert and oriented to person/place/time/situation Psych: Normal affect, patient feels well  ASSESSMENT:    1. Coronary artery disease involving native coronary artery of native heart with unstable angina pectoris (HCC)   2. Aortic atherosclerosis (HCC)   3. Essential hypertension   4. Heart failure with mildly reduced ejection fraction (HCC)    PLAN:    Coronary artery diease Hyperlipidemia (mixed) Aortic Atherosclerosis - s/p PCI in 04/2022 on monotherapy - he did not tolerate metoprolol due to dizziness - HA and near syncope with Imdur - no improvement no ranolazine - LDL at goal with statin  Heart Failure Reduced Ejection Fraction (combined systolic and diastolic) FHX of VT and HF younger brother - chronic - NYHA class II - Diuretic regimen: None- euvolemic - did not tolerate  medications as above - will rechallenge SGLT2i (had dizziness); if dizziness again needs amb referral to neurology    Seven months follow up      Medication Adjustments/Labs and Tests Ordered: Current medicines are reviewed at length with the patient today.  Concerns regarding medicines are outlined above.  No orders of the defined types were placed in this encounter.   Meds ordered this encounter  Medications   empagliflozin (JARDIANCE) 10 MG TABS tablet    Sig: Take 1 tablet (10 mg total) by mouth daily before breakfast.    Dispense:  90 tablet    Refill:  3     Patient Instructions  Medication Instructions:  Your  physician has recommended you make the following change in your medication:   RESTART: Jardiance 10 mg by mouth once daily before breakfast. IF you develop dizziness please stop Jardiance and send a message or call our office.   *If you need a refill on your cardiac medications before your next appointment, please call your pharmacy*   Lab Work: NONE If you have labs (blood work) drawn today and your tests are completely normal, you will receive your results only by: MyChart Message (if you have MyChart) OR A paper copy in the mail If you have any lab test that is abnormal or we need to change your treatment, we will call you to review the results.   Testing/Procedures: NONE   Follow-Up: At [facility], you and your health needs are our priority.  As part of our continuing mission to provide you with exceptional heart care, we have created designated Provider Care Teams.  These Care Teams include your primary Cardiologist (physician) and Advanced Practice Providers (APPs -  Physician Assistants and Nurse Practitioners) who all work together to provide you with the care you need, when you need it.     Your next appointment:   7 month(s)  Provider:   Christell Constant, MD        Signed, Christell Constant, MD  03/19/2023 3:09 PM    [facility]

## 2023-03-19 ENCOUNTER — Ambulatory Visit: Payer: Medicare Other | Attending: Internal Medicine | Admitting: Internal Medicine

## 2023-03-19 ENCOUNTER — Encounter: Payer: Self-pay | Admitting: Internal Medicine

## 2023-03-19 VITALS — BP 120/70 | HR 86 | Ht 73.0 in | Wt 182.6 lb

## 2023-03-19 DIAGNOSIS — I5022 Chronic systolic (congestive) heart failure: Secondary | ICD-10-CM | POA: Diagnosis present

## 2023-03-19 DIAGNOSIS — Z8249 Family history of ischemic heart disease and other diseases of the circulatory system: Secondary | ICD-10-CM | POA: Diagnosis present

## 2023-03-19 DIAGNOSIS — I700 Atherosclerosis of aorta: Secondary | ICD-10-CM | POA: Diagnosis present

## 2023-03-19 DIAGNOSIS — I25110 Atherosclerotic heart disease of native coronary artery with unstable angina pectoris: Secondary | ICD-10-CM | POA: Insufficient documentation

## 2023-03-19 DIAGNOSIS — I10 Essential (primary) hypertension: Secondary | ICD-10-CM | POA: Diagnosis present

## 2023-03-19 MED ORDER — EMPAGLIFLOZIN 10 MG PO TABS: 10.0000 mg | ORAL_TABLET | Freq: Every day | ORAL | 3 refills | Status: DC

## 2023-03-19 NOTE — Patient Instructions (Signed)
Medication Instructions:  Your physician has recommended you make the following change in your medication:   RESTART: Jardiance 10 mg by mouth once daily before breakfast. IF you develop dizziness please stop Jardiance and send a message or call our office.   *If you need a refill on your cardiac medications before your next appointment, please call your pharmacy*   Lab Work: NONE If you have labs (blood work) drawn today and your tests are completely normal, you will receive your results only by: MyChart Message (if you have MyChart) OR A paper copy in the mail If you have any lab test that is abnormal or we need to change your treatment, we will call you to review the results.   Testing/Procedures: NONE   Follow-Up: At [facility], you and your health needs are our priority.  As part of our continuing mission to provide you with exceptional heart care, we have created designated Provider Care Teams.  These Care Teams include your primary Cardiologist (physician) and Advanced Practice Providers (APPs -  Physician Assistants and Nurse Practitioners) who all work together to provide you with the care you need, when you need it.     Your next appointment:   7 month(s)  Provider:   Christell Constant, MD

## 2023-04-19 DIAGNOSIS — H40013 Open angle with borderline findings, low risk, bilateral: Secondary | ICD-10-CM | POA: Diagnosis not present

## 2023-04-19 DIAGNOSIS — H5213 Myopia, bilateral: Secondary | ICD-10-CM | POA: Diagnosis not present

## 2023-04-19 DIAGNOSIS — Z961 Presence of intraocular lens: Secondary | ICD-10-CM | POA: Diagnosis not present

## 2023-05-08 DIAGNOSIS — D2361 Other benign neoplasm of skin of right upper limb, including shoulder: Secondary | ICD-10-CM | POA: Diagnosis not present

## 2023-05-08 DIAGNOSIS — D224 Melanocytic nevi of scalp and neck: Secondary | ICD-10-CM | POA: Diagnosis not present

## 2023-05-08 DIAGNOSIS — Z85828 Personal history of other malignant neoplasm of skin: Secondary | ICD-10-CM | POA: Diagnosis not present

## 2023-05-08 DIAGNOSIS — L218 Other seborrheic dermatitis: Secondary | ICD-10-CM | POA: Diagnosis not present

## 2023-05-08 DIAGNOSIS — Z85820 Personal history of malignant melanoma of skin: Secondary | ICD-10-CM | POA: Diagnosis not present

## 2023-05-08 DIAGNOSIS — D2262 Melanocytic nevi of left upper limb, including shoulder: Secondary | ICD-10-CM | POA: Diagnosis not present

## 2023-05-08 DIAGNOSIS — L820 Inflamed seborrheic keratosis: Secondary | ICD-10-CM | POA: Diagnosis not present

## 2023-05-08 DIAGNOSIS — D2261 Melanocytic nevi of right upper limb, including shoulder: Secondary | ICD-10-CM | POA: Diagnosis not present

## 2023-05-08 DIAGNOSIS — L918 Other hypertrophic disorders of the skin: Secondary | ICD-10-CM | POA: Diagnosis not present

## 2023-05-08 DIAGNOSIS — D485 Neoplasm of uncertain behavior of skin: Secondary | ICD-10-CM | POA: Diagnosis not present

## 2023-05-08 DIAGNOSIS — L570 Actinic keratosis: Secondary | ICD-10-CM | POA: Diagnosis not present

## 2023-05-08 DIAGNOSIS — L821 Other seborrheic keratosis: Secondary | ICD-10-CM | POA: Diagnosis not present

## 2023-07-06 DIAGNOSIS — Z1331 Encounter for screening for depression: Secondary | ICD-10-CM | POA: Diagnosis not present

## 2023-07-06 DIAGNOSIS — Z0000 Encounter for general adult medical examination without abnormal findings: Secondary | ICD-10-CM | POA: Diagnosis not present

## 2023-07-06 DIAGNOSIS — Z23 Encounter for immunization: Secondary | ICD-10-CM | POA: Diagnosis not present

## 2023-07-06 DIAGNOSIS — Z1339 Encounter for screening examination for other mental health and behavioral disorders: Secondary | ICD-10-CM | POA: Diagnosis not present

## 2023-07-30 DIAGNOSIS — I5022 Chronic systolic (congestive) heart failure: Secondary | ICD-10-CM | POA: Diagnosis not present

## 2023-07-30 DIAGNOSIS — E785 Hyperlipidemia, unspecified: Secondary | ICD-10-CM | POA: Diagnosis not present

## 2023-07-30 DIAGNOSIS — R7301 Impaired fasting glucose: Secondary | ICD-10-CM | POA: Diagnosis not present

## 2023-07-30 DIAGNOSIS — I110 Hypertensive heart disease with heart failure: Secondary | ICD-10-CM | POA: Diagnosis not present

## 2023-07-30 DIAGNOSIS — N402 Nodular prostate without lower urinary tract symptoms: Secondary | ICD-10-CM | POA: Diagnosis not present

## 2023-08-03 ENCOUNTER — Encounter: Payer: Self-pay | Admitting: Internal Medicine

## 2023-08-06 DIAGNOSIS — B3742 Candidal balanitis: Secondary | ICD-10-CM | POA: Diagnosis not present

## 2023-08-06 DIAGNOSIS — I700 Atherosclerosis of aorta: Secondary | ICD-10-CM | POA: Diagnosis not present

## 2023-08-06 DIAGNOSIS — I5022 Chronic systolic (congestive) heart failure: Secondary | ICD-10-CM | POA: Diagnosis not present

## 2023-08-06 DIAGNOSIS — R7301 Impaired fasting glucose: Secondary | ICD-10-CM | POA: Diagnosis not present

## 2023-08-06 DIAGNOSIS — I209 Angina pectoris, unspecified: Secondary | ICD-10-CM | POA: Diagnosis not present

## 2023-08-06 DIAGNOSIS — G20C Parkinsonism, unspecified: Secondary | ICD-10-CM | POA: Diagnosis not present

## 2023-08-06 DIAGNOSIS — I2510 Atherosclerotic heart disease of native coronary artery without angina pectoris: Secondary | ICD-10-CM | POA: Diagnosis not present

## 2023-08-06 DIAGNOSIS — G250 Essential tremor: Secondary | ICD-10-CM | POA: Diagnosis not present

## 2023-08-06 DIAGNOSIS — G3184 Mild cognitive impairment, so stated: Secondary | ICD-10-CM | POA: Diagnosis not present

## 2023-08-06 DIAGNOSIS — I110 Hypertensive heart disease with heart failure: Secondary | ICD-10-CM | POA: Diagnosis not present

## 2023-08-06 DIAGNOSIS — R9089 Other abnormal findings on diagnostic imaging of central nervous system: Secondary | ICD-10-CM | POA: Diagnosis not present

## 2023-08-06 DIAGNOSIS — R82998 Other abnormal findings in urine: Secondary | ICD-10-CM | POA: Diagnosis not present

## 2023-08-06 DIAGNOSIS — E785 Hyperlipidemia, unspecified: Secondary | ICD-10-CM | POA: Diagnosis not present

## 2023-08-21 DIAGNOSIS — I2510 Atherosclerotic heart disease of native coronary artery without angina pectoris: Secondary | ICD-10-CM | POA: Diagnosis not present

## 2023-08-21 DIAGNOSIS — A6000 Herpesviral infection of urogenital system, unspecified: Secondary | ICD-10-CM | POA: Diagnosis not present

## 2023-08-21 DIAGNOSIS — I5022 Chronic systolic (congestive) heart failure: Secondary | ICD-10-CM | POA: Diagnosis not present

## 2023-08-21 DIAGNOSIS — I209 Angina pectoris, unspecified: Secondary | ICD-10-CM | POA: Diagnosis not present

## 2023-08-21 DIAGNOSIS — B3742 Candidal balanitis: Secondary | ICD-10-CM | POA: Diagnosis not present

## 2023-08-31 DIAGNOSIS — I5022 Chronic systolic (congestive) heart failure: Secondary | ICD-10-CM | POA: Diagnosis not present

## 2023-08-31 DIAGNOSIS — A6000 Herpesviral infection of urogenital system, unspecified: Secondary | ICD-10-CM | POA: Diagnosis not present

## 2023-08-31 DIAGNOSIS — B3742 Candidal balanitis: Secondary | ICD-10-CM | POA: Diagnosis not present

## 2023-09-11 ENCOUNTER — Other Ambulatory Visit: Payer: Self-pay | Admitting: Internal Medicine

## 2023-10-22 ENCOUNTER — Ambulatory Visit: Payer: Medicare Other | Admitting: Internal Medicine

## 2024-01-14 ENCOUNTER — Encounter: Payer: Self-pay | Admitting: Internal Medicine

## 2024-01-14 ENCOUNTER — Ambulatory Visit: Attending: Internal Medicine | Admitting: Internal Medicine

## 2024-01-14 VITALS — BP 124/77 | HR 65 | Ht 74.0 in | Wt 180.6 lb

## 2024-01-14 DIAGNOSIS — R002 Palpitations: Secondary | ICD-10-CM | POA: Insufficient documentation

## 2024-01-14 DIAGNOSIS — I25110 Atherosclerotic heart disease of native coronary artery with unstable angina pectoris: Secondary | ICD-10-CM | POA: Diagnosis not present

## 2024-01-14 DIAGNOSIS — I700 Atherosclerosis of aorta: Secondary | ICD-10-CM | POA: Diagnosis not present

## 2024-01-14 DIAGNOSIS — I5022 Chronic systolic (congestive) heart failure: Secondary | ICD-10-CM | POA: Insufficient documentation

## 2024-01-14 DIAGNOSIS — I10 Essential (primary) hypertension: Secondary | ICD-10-CM | POA: Diagnosis present

## 2024-01-14 DIAGNOSIS — E7849 Other hyperlipidemia: Secondary | ICD-10-CM | POA: Diagnosis not present

## 2024-01-14 NOTE — Progress Notes (Signed)
 Cardiology Office Note:    Date:  01/14/2024   ID:  Garhett Bernhard, DOB [birth year], MRN [id number]  PCP:  Loreli Elsie JONETTA Mickey., MD  [facility] Cardiologist: Stanly Leavens MD [facility] Electrophysiologist:  None   CC: CAD f/u  History of Present Illness:    Carston Riedl is a 78 y.o. male with a HTN and HLD who presented for evaluation 07/13/20.  2022: Cardiac CT which showed multi-vessel disease; patient OYR:ojmhzob non obstructive disease save for with obstructive apical disease. With medical therapy, lightheaded on BB.   2023: started dietary changes for CAD secondary prevention.  Had worsening CP.  R Radial approach PCI of LAD.  He had improvement in angina but notable right arm pain.   Saw [facility]VS.  Saw APP in 05/03/22. 2024: Still having dizziness.  Completed cardiac rehab.  Worsening SOB: EF mildly decreased.  They are going to [place] in 2 months 2025: Going to [place] in October  Mr. Brumett experiences chest pain two to four times a month, describing it as a 'bowling ball sitting on my chest.' The pain is mild but noticeable, with occasional tightness without pain. He attributes some discomfort to weather and exertion. He recalls a past procedure where a stent was placed, which alleviated his chest pain at the time. He mentions a conversation he overheard about a balloon not expanding during the procedure, but is unsure if it pertained to him.  He feels 'really crappy' on two or three days a month, describing it as general malaise rather than nausea or flu-like symptoms. Most days, he feels fine as long as he stays hydrated and avoids overexertion.  He experiences dizziness upon standing, which he describes as low level. He previously experienced dizziness with SGLT2 inhibitors.  He has a history of multivessel coronary artery calcifications, largely nonobstructive with obstructive apical disease, and has not tolerated medication therapy well in the past. He is  currently on rosuvastatin  and aspirin , but not on beta blockers, MRAs, or SGLT2 inhibitors due to side effects such as dizziness and rash.  Had issues with ranolazine  as well.  He has a family history of heart failure, with his younger brother having chronic heart failure with reduced ejection fraction.   Past Medical History:  Diagnosis Date   Angina pectoris (HCC)    Cataract    Exertional chest pain    Hyperlipidemia    Hypertension    Shingles 2012   Tinnitus     Past Surgical History:  Procedure Laterality Date   COLONOSCOPY     CORONARY PRESSURE/FFR STUDY N/A 04/25/2022   Procedure: INTRAVASCULAR PRESSURE WIRE/FFR STUDY;  Surgeon: Dann Candyce RAMAN, MD;  Location: [facility] INVASIVE CV LAB;  Service: Cardiovascular;  Laterality: N/A;   CORONARY STENT INTERVENTION N/A 04/25/2022   Procedure: CORONARY STENT INTERVENTION;  Surgeon: Dann Candyce RAMAN, MD;  Location: [facility] INVASIVE CV LAB;  Service: Cardiovascular;  Laterality: N/A;   LEFT HEART CATH AND CORONARY ANGIOGRAPHY N/A 08/16/2020   Procedure: LEFT HEART CATH AND CORONARY ANGIOGRAPHY;  Surgeon: Mady Bruckner, MD;  Location: [facility] INVASIVE CV LAB;  Service: Cardiovascular;  Laterality: N/A;   LEFT HEART CATH AND CORONARY ANGIOGRAPHY N/A 04/25/2022   Procedure: LEFT HEART CATH AND CORONARY ANGIOGRAPHY;  Surgeon: Dann Candyce RAMAN, MD;  Location: [facility] INVASIVE CV LAB;  Service: Cardiovascular;  Laterality: N/A;   ROTATOR CUFF REPAIR Bilateral    wisdom teeth      Current Medications: Current Meds  Medication Sig   aspirin  EC 81  MG tablet Take 1 tablet (81 mg total) by mouth daily. Swallow whole.   Cyanocobalamin (B-12) 1000 MCG SUBL Place 1,000 mcg under the tongue 2 (two) times a week.   nitroGLYCERIN  (NITROSTAT ) 0.4 MG SL tablet Place 1 tablet (0.4 mg total) under the tongue every 5 (five) minutes as needed for chest pain.   nystatin cream (MYCOSTATIN) 1 Application as needed for dry skin.   rosuvastatin  (CRESTOR ) 40 MG  tablet TAKE 1 TABLET BY MOUTH DAILY   triamcinolone lotion (KENALOG) 0.1 % Apply topically as needed (skin irritation).   VALTREX 1 g tablet as needed (cold sores).   [DISCONTINUED] JARDIANCE  10 MG TABS tablet Take 10 mg by mouth daily.     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[place]  history  Tobacco Use   Smoking status: Never   Smokeless tobacco: Never  Vaping Use   Vaping status: Never Used  Substance and Sexual Activity   Alcohol  use: Yes    Comment: occasional beer   Drug use: Not Currently   Sexual activity: Not on file  Other Topics Concern   Not on file  Social History Narrative   Right handed   Lives with wife    Retired   Chief Executive Officer [employer] investment banker Strain: Not on file  Food Insecurity: No Food Insecurity (04/25/2022)   Hunger Vital Sign    Worried About Running Out of Food in the Last Year: Never true    Ran Out of Food in the Last Year: Never true  Transportation Needs: No Transportation Needs (04/25/2022)   PRAPARE - Administrator, Civil Service (Medical): No    Lack of Transportation (Non-Medical): No  Physical Activity: Not on file  Stress: Not on file  Social Connections: Not on file    Social: [place] to [employer], married; retired; went to [place] in 2022, went to [place] in 2023 [place] in 2024, then [place]  Family History: The patient's family history includes COPD in his mother; Diabetes in his mother; Heart attack in his father, sister, and sister; Kidney disease in his mother. There is no history of Colon cancer, Colon polyps, Esophageal cancer, Rectal cancer, or Stomach cancer. History of coronary artery disease notable for  sisters and father. History of heart failure notable for no members. History of arrhythmia notable for no members. Sister died young at the age of 18 (passed in her sleep). Brother has ICD  ROS:   Please see the history of present illness.      EKGs/Labs/Other Studies Reviewed:    The following studies were reviewed today:  EKG:   3/923: SR rate 73 08/24/20: Sinus Bradycardia 1st HB 07/13/20: SR rate 69 1st Hb with Baseline artifact  Cardiac Studies & Procedures   ______________________________________________________________________________________________ CARDIAC CATHETERIZATION  CARDIAC CATHETERIZATION 04/25/2022  Conclusion   Dist LAD lesion is 95% stenosed.  Unchanged from prior and too distal for intervention.   Ost Cx to Prox Cx lesion is 20% stenosed.   1st Diag lesion is 50% stenosed.   1st LPL lesion is 40% stenosed.   Prox LAD lesion is 50% stenosed.  RFR abnormal, 0.83 indicating  significant flow reduction in this territory.   A drug-eluting stent was successfully placed using a SYNERGY XD 2.75X24, postdilated to 3.25 mm.   Post intervention, there is a 0% residual stenosis.   The left ventricular systolic function is normal.   LV end diastolic pressure is normal.   The left ventricular ejection fraction is 55-65% by visual estimate.   There is no aortic valve stenosis.  Continue dual antiplatelet therapy along with aggressive secondary prevention.  He had typical anginal symptoms with balloon inflations.  Should be able to gradually decrease antianginal therapy starting with ranolazine .  He does still have distal LAD disease which is not amenable to PCI.  Findings Coronary Findings Diagnostic  Dominance: Co-dominant  Left Main Vessel is large. Vessel is angiographically normal.  Left Anterior Descending Vessel is large. Prox LAD lesion is 50% stenosed. The lesion is eccentric. The lesion is severely calcified. Pressure wire/FFR was performed on the lesion.  RFR 0.83, positive for ischemia Dist LAD lesion is 95% stenosed.  First Diagonal Branch Vessel is small in size. 1st Diag lesion is 50% stenosed.  Second Diagonal Branch Vessel is small in size.  Third Diagonal Branch Vessel is small in size.  Ramus Intermedius Vessel is small. Vessel is angiographically normal.  Left Circumflex Vessel is large. Ost Cx to Prox Cx lesion is 20% stenosed.  First Obtuse Marginal Branch Vessel is small in size.  Second Obtuse Marginal Branch Vessel is moderate in size. The vessel exhibits minimal luminal irregularities.  Third Obtuse Marginal Branch Vessel is moderate in size. The vessel exhibits minimal luminal irregularities.  First Left Posterolateral Branch Vessel is large in size. 1st LPL lesion is 40% stenosed.  Second Left Posterolateral Branch Vessel is small in size.  Third Left Posterolateral Branch Vessel is small in size.  Right Coronary Artery Vessel is moderate in size. There is moderate diffuse disease throughout the vessel.  Right Posterior Descending Artery Vessel is small in size.  Intervention  Prox LAD lesion Stent CATH LAUNCHER 6FR EBU3.5 guide catheter was inserted. Lesion crossed with guidewire using a GUIDEWIRE PRESSURE X 175. Pre-stent angioplasty was performed using a BALLN WOLVERINE 2.75X10. A drug-eluting stent was successfully placed using a SYNERGY XD 2.75X24. Post-stent angioplasty was performed using a BALL SAPPHIRE NC24 3.25X12. [place] used as a Therapist, art.  Initially, a 3.0 x 15 Wolverine balloon was used to attempt to predilate.  This would not cross the tortuosity into the diseased area of the LAD.  We then used a 2.5 semicompliant balloon to predilate.  The 3.0 Wolverine balloon still would not cross.  We then changed for a 2.75 x 12 Wolverine balloon which eventually did cross into the diseased area of the proximal LAD.  Multiple inflations were done.  A 2.75 x 24 Synergy drug-eluting stent was then  deployed and subsequently postdilated with a 3.25 [place] balloon. Post-Intervention Lesion Assessment The intervention was successful. Pre-interventional TIMI flow is 3. Post-intervention TIMI flow is 3. No complications occurred at this lesion. Additional compression to the forearm performed with blood pressure cuff due to concern for hematoma. There is a 0% residual stenosis post intervention.   CARDIAC CATHETERIZATION  CARDIAC CATHETERIZATION 08/16/2020  Conclusion Conclusions: 1. Multivessel coronary artery disease, as detailed below.  Most severe lesion is a 95% stenosis involving the apical LAD.  There are also 50% stenoses involving the proximal LAD and small D1 branch, as well as 40% lPL1 stenosis and moderate diffuse disease involving the RCA. 2. Low normal  left ventricular systolic function (LVEF 50-55%) with mildly elevated filling pressure (LVEDP ~20 mmHg).  Recommendations: 1. Optimize medical therapy; 95% apical LAD stenosis is not well-suited for PCI given distal location and vessel size.  Will add metoprolol  tartrate 25 mg twice daily. 2. Aggressive secondary prevention of coronary artery disease.  Lonni Hanson, MD [facility]  Findings Coronary Findings Diagnostic  Dominance: Co-dominant  Left Main Vessel is large. Vessel is angiographically normal.  Left Anterior Descending Vessel is large. Prox LAD lesion is 50% stenosed. The lesion is eccentric. The lesion is severely calcified. Dist LAD lesion is 95% stenosed.  First Diagonal Branch Vessel is small in size. 1st Diag lesion is 50% stenosed.  Second Diagonal Branch Vessel is small in size.  Third Diagonal Branch Vessel is small in size.  Ramus Intermedius Vessel is small. Vessel is angiographically normal.  Left Circumflex Vessel is large. Ost Cx to Prox Cx lesion is 20% stenosed.  First Obtuse Marginal Branch Vessel is small in size.  Second Obtuse Marginal Branch Vessel is moderate in size.  The vessel exhibits minimal luminal irregularities.  Third Obtuse Marginal Branch Vessel is moderate in size. The vessel exhibits minimal luminal irregularities.  First Left Posterolateral Branch Vessel is large in size. 1st LPL lesion is 40% stenosed.  Second Left Posterolateral Branch Vessel is small in size.  Third Left Posterolateral Branch Vessel is small in size.  Right Coronary Artery Vessel is moderate in size. There is moderate diffuse disease throughout the vessel.  Right Posterior Descending Artery Vessel is small in size.  Intervention  No interventions have been documented.     ECHOCARDIOGRAM  ECHOCARDIOGRAM COMPLETE 11/30/2022  Narrative ECHOCARDIOGRAM REPORT    Patient Name:   REINALDO HELT Date of Exam: 11/30/2022 Medical Rec #:  [id number]        Height:       74.0 in Accession #:    [id number]       Weight:       187.0 lb Date of Birth:  [birth year]         BSA:          2.112 m Patient Age:    77 years         BP:           108/64 mmHg Patient Gender: M                HR:           66 bpm. Exam Location:  [address]  Procedure: 2D Echo, Cardiac Doppler, Color Doppler, 3D Echo and Strain Analysis  Indications:    R06.02 SOB; R60.0 Lower extremity edema  History:        Patient has no prior history of Echocardiogram examinations. Risk Factors:Hypertension and Dyslipidemia.  Sonographer:    Augustin Seals RDCS Referring Phys: [id number] [facility] A Kihanna Kamiya  IMPRESSIONS   1. Left ventricular ejection fraction, by estimation, is 45 to 50%. The left ventricle has mildly decreased function. The left ventricle demonstrates global hypokinesis. Left ventricular diastolic parameters are consistent with Grade I diastolic dysfunction (impaired relaxation). 2. Right ventricular systolic function is mildly reduced. The right ventricular size is normal. There is normal pulmonary artery systolic pressure. The estimated right ventricular systolic  pressure is 27.8 mmHg. 3. The mitral valve is normal in structure. Trivial mitral valve regurgitation. No evidence of mitral stenosis. 4. The aortic valve is tricuspid. There is mild calcification of the aortic valve. Aortic valve regurgitation is mild.  No aortic stenosis is present. 5. Aortic dilatation noted. There is mild dilatation of the ascending aorta, measuring 42 mm. 6. The inferior vena cava is normal in size with greater than 50% respiratory variability, suggesting right atrial pressure of 3 mmHg.  FINDINGS Left Ventricle: Left ventricular ejection fraction, by estimation, is 45 to 50%. The left ventricle has mildly decreased function. The left ventricle demonstrates global hypokinesis. The left ventricular internal cavity size was normal in size. There is no left ventricular hypertrophy. Left ventricular diastolic parameters are consistent with Grade I diastolic dysfunction (impaired relaxation).  Right Ventricle: The right ventricular size is normal. No increase in right ventricular wall thickness. Right ventricular systolic function is mildly reduced. There is normal pulmonary artery systolic pressure. The tricuspid regurgitant velocity is 2.49 m/s, and with an assumed right atrial pressure of 3 mmHg, the estimated right ventricular systolic pressure is 27.8 mmHg.  Left Atrium: Left atrial size was normal in size.  Right Atrium: Right atrial size was normal in size.  Pericardium: There is no evidence of pericardial effusion.  Mitral Valve: The mitral valve is normal in structure. Trivial mitral valve regurgitation. No evidence of mitral valve stenosis.  Tricuspid Valve: The tricuspid valve is normal in structure. Tricuspid valve regurgitation is trivial.  Aortic Valve: The aortic valve is tricuspid. There is mild calcification of the aortic valve. Aortic valve regurgitation is mild. Aortic regurgitation PHT measures 445 msec. No aortic stenosis is present.  Pulmonic Valve: The  pulmonic valve was normal in structure. Pulmonic valve regurgitation is not visualized.  Aorta: Aortic dilatation noted. There is mild dilatation of the ascending aorta, measuring 42 mm.  Venous: The inferior vena cava is normal in size with greater than 50% respiratory variability, suggesting right atrial pressure of 3 mmHg.  IAS/Shunts: No atrial level shunt detected by color flow Doppler.   LEFT VENTRICLE PLAX 2D LVIDd:         4.90 cm     Diastology LVIDs:         3.70 cm     LV e' medial:    6.20 cm/s LV PW:         1.10 cm     LV E/e' medial:  8.1 LV IVS:        0.90 cm     LV e' lateral:   6.85 cm/s LVOT diam:     2.50 cm     LV E/e' lateral: 7.3 LV SV:         82 LV SV Index:   39 LVOT Area:     4.91 cm  3D Volume EF: LV Volumes (MOD)           3D EF:        53 % LV vol d, MOD A2C: 73.5 ml LV EDV:       136 ml LV vol d, MOD A4C: 83.8 ml LV ESV:       64 ml LV vol s, MOD A2C: 36.1 ml LV SV:        72 ml LV vol s, MOD A4C: 41.6 ml LV SV MOD A2C:     37.4 ml LV SV MOD A4C:     83.8 ml LV SV MOD BP:      40.2 ml  RIGHT VENTRICLE RV Basal diam:  3.90 cm RV Mid diam:    3.00 cm RV S prime:     10.10 cm/s TAPSE (M-mode): 2.4 cm  LEFT ATRIUM  Index        RIGHT ATRIUM           Index LA diam:        3.70 cm 1.75 cm/m   RA Area:     18.10 cm LA Vol (A2C):   32.4 ml 15.34 ml/m  RA Volume:   60.10 ml  28.45 ml/m LA Vol (A4C):   35.1 ml 16.62 ml/m LA Biplane Vol: 36.2 ml 17.14 ml/m AORTIC VALVE LVOT Vmax:   72.90 cm/s LVOT Vmean:  47.600 cm/s LVOT VTI:    0.166 m AI PHT:      445 msec  AORTA Ao Root diam: 3.30 cm Ao Asc diam:  4.20 cm  MITRAL VALVE               TRICUSPID VALVE MV Area (PHT): 2.87 cm    TR Peak grad:   24.8 mmHg MV Decel Time: 264 msec    TR Vmax:        249.00 cm/s MV E velocity: 50.30 cm/s MV A velocity: 63.40 cm/s  SHUNTS MV E/A ratio:  0.79        Systemic VTI:  0.17 m Systemic Diam: 2.50 cm  Dalton  McleanMD Electronically signed by Ezra Kanner Signature Date/Time: 11/30/2022/11:15:15 AM    Final    MONITORS  LONG TERM MONITOR (3-14 DAYS) 12/27/2022  Narrative   Patient had a minimum heart rate of 50 bpm, maximum heart rate of 180 bpm, and average heart rate of 73 bpm.   Predominant underlying rhythm was sinus rhythm.   Paroxysmal SVT, three episodes,  lasting 6 beats at longest with a max rate of 135 bpm at fastest.   Isolated PACs were rare (<1.0%).   Isolated PVCs were occasional (3.0%).   Triggered and diary events associated with sinus rhythm, PACs, or PVCs.  Asymptomatic SVT.   CT SCANS  CT CORONARY FRACTIONAL FLOW RESERVE DATA PREP 08/10/2020  Narrative EXAM: CT-FFR ANALYSIS  CLINICAL DATA:  Multi-vessel coronary disease  FINDINGS: CT-FFR analysis was performed on the original cardiac CT angiogram dataset. Diagrammatic representation of the CT-FFR analysis is provided in a separate PDF document in PACS. This dictation was created using the PDF document and an interactive 3D model of the results. 3D model is not available in the EMR/PACS. Normal FFR range is >0.80.  1. Left Main: No significant functional stenosis, CT-FFR 0.99.  2. LAD: CT-FFR at the proximal LAD, CT-FFR 0.80 at the mid LAD, and CT-FFR 0.70 at the distal vessel. Unable to process diagonal vessels. 3. LCX: No significant functional stenosis, CT-FFR 0.97. 4. RCA: CT-FFR 0.92 at the proximal RCA, CT-FFR 0.87 at the mid RCA, and CT-FFR 0.83 at the distal vessel.  IMPRESSION: 1. CT FFR analysis shows possible significant functional stenosis in the distal RCA, and the mid LAD. There is significant functional stenosis in the distal LAD.  Stanly Leavens MD   Electronically Signed By: Stanly Leavens MD On: 08/11/2020 09:13   CT CORONARY MORPH W/CTA COR W/SCORE 08/10/2020  Addendum 08/10/2020 12:55 PM ADDENDUM REPORT: 08/10/2020 12:52  CLINICAL DATA:  78 Year-old White  Male  EXAM: Cardiac/Coronary CTA  TECHNIQUE: The patient was scanned on a [employer].  FINDINGS: A 100 kV prospective scan was triggered in the descending thoracic aorta at 111 HU's. Axial non-contrast 3 mm slices were carried out through the heart. The data set was analyzed on a dedicated work station and scored using the Agatson method. Gantry rotation  speed was 250 msecs and collimation was .6 mm. No beta blockade and 0.8 mg of sl NTG was given. The 3D data set was reconstructed in 5% intervals of the 67-82 % of the R-R cycle. Diastolic phases were analyzed on a dedicated work station using MPR, MIP and VRT modes. The patient received 100 cc of contrast.  Aorta: Upper limits of normal; ascending aortic size 39 mm, with ascending aortic height index 2.10 cm/m. Descending aortic atherosclerosis noted. No dissection.  Aortic Valve: Tri-leaflet. Multiple small annular calcifications noted. Mild valvular calcification.  Coronary Arteries:  Normal coronary origin.  Right dominance.  Coronary calcium  score of 1724. This was 90th percentile for age, sex, and race matched control.  RCA is a large dominant artery that gives rise to PDA and PLA. There is a 70-99% mixed plaque stenosis, with a subsequent 50-70% mixed plaque stenosis in the proximal vessel, there is a 50-70% mixed plaque stenosis in the mid vessel, there is a 70-90% calcified plaque stenosis in the distal vessel. In addition there are minimal non-obstructive calcified plaques scattered through the mid/distal vessel and its branches.  Left main is a large artery that gives rise to LAD and LCX arteries. The ostia of the left main appear aneurysmal measuring 16 mm with mild calcification. This narrows to 5 mm by the distal vessel. There is 30% distal stenosis with a calcified plaque with napkin ring sign (high risk feature).  LAD is a large vessel that gives off two diagonal vessels. There is an ostial  50-70% calcified stenosis narrowing to a 70-99% mixed plaque stenosis in the proximal vessel with 50-70% calcified plaque stenosis in the mid vessel. There is a mild non obstructive (25-49%) calcified plaque in the ostium of the D1 vessel. There is a mild non obstructive (25-49%) calcified plaque in the ostium of the distal vessel.  LCX is a non-dominant artery that gives rise to four obtuse marginal vessels including a large OM4 branch that bifurcates. There is moderate (50-70%) calcified stenosis in the proximal vessel with multiple minimal non obstructive (1-24%) calcified plaques in the mid and distant vessel. There is a minimal non obstructive (1-24%) calcified plaque in the of the OM1 vessel. There is a minimal non obstructive (1-24%) soft plaque in the of the OM3 vessel. There is a mild non obstructive (25-49%) calcified plaque in the OM4 vessel prior to the bifurcation.  Other findings:  Normal pulmonary vein drainage into the left atrium.  Normal left atrial appendage without a thrombus.  Normal size of the pulmonary artery.  Extra-cardiac findings: See attached radiology report for non-cardiac structures.  IMPRESSION: 1. Coronary calcium  score of 1724. This was 90th percentile for age, sex, and race matched control.  2. Normal coronary origin with right dominance.  3. CAD-RADS 4 Severe stenosis. (70-99% or > 50% left main). There is left main, RCA, LAD, and LCX disease. Cardiac catheterization or CT FFR is recommended. Consider symptom-guided anti-ischemic pharmacotherapy as well as risk factor modification per guideline directed care.  4.  Left main appears aneurysmal: Ostial diameter measured at 16 mm.  5. Upper limits of normal ascending aortic size 39 mm, with ascending aortic height index 2.10 cm/m.  6.  Aortic atherosclerosis noted.  7.  Mild aortic valve calcification and mild annular calcification.   Electronically Signed By: Stanly Leavens  MD On: 08/10/2020 12:52  Narrative EXAM: OVER-READ INTERPRETATION  CT CHEST  The following report is an over-read performed by radiologist Dr. Marcey Moan of  [facility] on 08/10/2020. This over-read does not include interpretation of cardiac or coronary anatomy or pathology. The coronary CTA interpretation by the cardiologist is attached.  COMPARISON:  None.  FINDINGS: Vascular: No significant vascular findings. Normal heart size. No pericardial effusion.  Mediastinum/Nodes: Visualized mediastinum and hilar regions demonstrate calcified right hilar and subcarinal lymph nodes consistent with prior granulomatous disease.  Lungs/Pleura: Visualized lungs show no evidence of pulmonary edema, consolidation, pneumothorax, nodule or pleural fluid.  Upper Abdomen: No acute abnormality.  Musculoskeletal: No chest wall mass or suspicious bone lesions identified.  IMPRESSION: Calcified right hilar and subcarinal lymph nodes consistent with prior granulomatous disease.  Electronically Signed: By: Marcey Moan M.D. On: 08/10/2020 11:17     ______________________________________________________________________________________________       Recent Labs: 01/15/2023: BUN 14; Creatinine, Ser 0.97; Potassium 4.9; Sodium 143  Recent Lipid Panel    Component Value Date/Time   CHOL 116 11/23/2020 0811   TRIG 73 11/23/2020 0811   HDL 52 11/23/2020 0811   CHOLHDL 2.2 11/23/2020 0811   LDLCALC 49 11/23/2020 0811    Physical Exam:    VS:  BP 124/77 (BP Location: Left Arm)   Pulse 65   Ht 6' 2 (1.88 m)   Wt 180 lb 9.6 oz (81.9 kg)   SpO2 95%   BMI 23.19 kg/m     Wt Readings from Last 3 Encounters:  01/14/24 180 lb 9.6 oz (81.9 kg)  03/19/23 182 lb 9.6 oz (82.8 kg)  01/31/23 184 lb (83.5 kg)    Gen: No distress   Neck: No JVD Cardiac: No Rubs or Gallops no murmur  Respiratory: Clear to auscultation bilaterally, normal effort, normal  respiratory  rate GI: Soft, nontender, non-distended  MS: trace bilateral pitting edema;  moves all extremities Integument: Skin feels warm Neuro:  At time of evaluation, alert and oriented to person/place/time/situation Psych: Normal affect, patient feels well  ASSESSMENT:    1. Coronary artery disease involving native coronary artery of native heart with unstable angina pectoris (HCC)   2. Aortic atherosclerosis (HCC)   3. Heart failure with mildly reduced ejection fraction (HCC)   4. Palpitations   5. Other hyperlipidemia   6. Essential hypertension     PLAN:    Coronary artery disease HLD Aortic atherosclerosis - Intermittent mild chest pain described as a bowling ball on the chest. Multivessel coronary artery calcifications with obstructive apical disease. Previous stent placement provided good symptomatic relief. GDMT not well-tolerated due to dizziness. - Continue current management without changes. - Consider ivabradine for heart rate control and coronary artery disease management if symptoms worsen. - Consider finerinone for volume management if symptoms worsen. - Discuss L-arginine and L-citrulline supplements for chest pain relief if symptoms worsen, noting potential side effects - Order myocardial perfusion imaging test if symptoms escalate (PET MPI) - Consider repeat heart catheterization if symptoms are severe. - Continue rosuvastatin ; LDL is at goal  Bifascicular block - New right bundle branch block on EKG, meeting criteria for bifascicular block no symptoms; no plans for AV nodal therapy given orthostatic hypotension  Orthostatic dizziness Dizziness upon standing, particularly in heat and humidity. Previous dizziness with SGLT2 inhibitors led to discontinuation. - Avoid Jardiance  due to dizziness and rash.   December f/u with me  Longitudinal care: The evaluation and management services provided today reflect the complexity inherent in caring for this patient, including  the ongoing longitudinal relationship and management of multiple chronic conditions and/or the need for care coordination. The  visit required a comprehensive assessment and management plan tailored to the patient's unique needs Time was spent addressing not only the acute concerns but also the broader context of the patient's health, including preventive care, chronic disease management, and care coordination as appropriate.  Complex longitudinal is necessary for conditions including: CAD therapy with limited tradition options for medication therapy; reviewed level of evidence of the above recommendations and the research around them     Stanly Leavens, MD FASE [facility] Cardiologist [facility]  [address] [place], [place] [address] [phone]  12:34 PM

## 2024-01-14 NOTE — Patient Instructions (Signed)
 Medication Instructions:  Your physician has recommended you make the following change in your medication:  REMOVED: Jardiance  from your medication list and added medication as an Allergy  *If you need a refill on your cardiac medications before your next appointment, please call your pharmacy*  Lab Work: NONE  If you have labs (blood work) drawn today and your tests are completely normal, you will receive your results only by: MyChart Message (if you have MyChart) OR A paper copy in the mail If you have any lab test that is abnormal or we need to change your treatment, we will call you to review the results.  Testing/Procedures: NONE  Follow-Up: At [facility], you and your health needs are our priority.  As part of our continuing mission to provide you with exceptional heart care, our providers are all part of one team.  This team includes your primary Cardiologist (physician) and Advanced Practice Providers or APPs (Physician Assistants and Nurse Practitioners) who all work together to provide you with the care you need, when you need it.  Your next appointment:   5 month(s)  Provider:   Stanly DELENA Leavens, MD

## 2024-01-17 DIAGNOSIS — H40013 Open angle with borderline findings, low risk, bilateral: Secondary | ICD-10-CM | POA: Diagnosis not present

## 2024-01-17 DIAGNOSIS — H26491 Other secondary cataract, right eye: Secondary | ICD-10-CM | POA: Diagnosis not present

## 2024-01-17 DIAGNOSIS — Z961 Presence of intraocular lens: Secondary | ICD-10-CM | POA: Diagnosis not present

## 2024-03-03 ENCOUNTER — Other Ambulatory Visit: Payer: Self-pay

## 2024-03-03 ENCOUNTER — Ambulatory Visit
Admission: EM | Admit: 2024-03-03 | Discharge: 2024-03-03 | Disposition: A | Discharge status: Home / Self Care | Attending: Family Medicine | Admitting: Family Medicine

## 2024-03-03 DIAGNOSIS — M5441 Lumbago with sciatica, right side: Secondary | ICD-10-CM

## 2024-03-03 DIAGNOSIS — M5442 Lumbago with sciatica, left side: Secondary | ICD-10-CM

## 2024-03-03 MED ORDER — CYCLOBENZAPRINE HCL 5 MG PO TABS: 5.0000 mg | ORAL_TABLET | Freq: Every evening | ORAL | 0 refills | Status: AC | PRN

## 2024-03-03 MED ORDER — KETOROLAC TROMETHAMINE 30 MG/ML IJ SOLN
15.0000 mg | Freq: Once | INTRAMUSCULAR | Status: AC
  Administered 2024-03-03: 15 mg via INTRAMUSCULAR

## 2024-03-03 NOTE — ED Provider Notes (Signed)
 [facility]    CSN: [id number] Arrival date & time: 03/03/24  1021      History   Chief Complaint No chief complaint on file.   HPI Jeffery Kirby is a 78 y.o. male presents for back pain.  Patient reports a week ago while mowing the lawn he pulled his right lower back.  States since then he has been having an intermittent aching/sharp pain that does radiate slightly into his right leg.  He denies any numbness/tingling/weakness of his lower extremities, no bowel or bladder incontinence, no saddle paresthesia.  Does have a history of back strains in the past but denies surgeries or fractures.  He has been alternating heat, ice, over-the-counter lidocaine  patches, and he took an old prescription of a muscle relaxer he had.  No other concerns at this time  HPI  Past Medical History:  Diagnosis Date   Angina pectoris [facility])    Cataract    Exertional chest pain    Hyperlipidemia    Hypertension    Shingles 2012   Tinnitus     Patient Active Problem List   Diagnosis Date Noted   Heart failure with mildly reduced ejection fraction (HCC) 12/07/2022   Family history of ventricular tachycardia 12/07/2022   Coronary artery disease involving native coronary artery of native heart with unstable angina pectoris (HCC) 08/24/2020   Hyperlipidemia 08/10/2020   Aortic atherosclerosis (HCC) 08/10/2020   Essential hypertension 08/10/2020    Past Surgical History:  Procedure Laterality Date   COLONOSCOPY     CORONARY PRESSURE/FFR STUDY N/A 04/25/2022   Procedure: INTRAVASCULAR PRESSURE WIRE/FFR STUDY;  Surgeon: Dann Candyce RAMAN, MD;  Location: [facility] INVASIVE CV LAB;  Service: Cardiovascular;  Laterality: N/A;   CORONARY STENT INTERVENTION N/A 04/25/2022   Procedure: CORONARY STENT INTERVENTION;  Surgeon: Dann Candyce RAMAN, MD;  Location: [facility] INVASIVE CV LAB;  Service: Cardiovascular;  Laterality: N/A;   LEFT HEART CATH AND CORONARY ANGIOGRAPHY N/A 08/16/2020   Procedure: LEFT  HEART CATH AND CORONARY ANGIOGRAPHY;  Surgeon: Mady Bruckner, MD;  Location: [facility] INVASIVE CV LAB;  Service: Cardiovascular;  Laterality: N/A;   LEFT HEART CATH AND CORONARY ANGIOGRAPHY N/A 04/25/2022   Procedure: LEFT HEART CATH AND CORONARY ANGIOGRAPHY;  Surgeon: Dann Candyce RAMAN, MD;  Location: [facility] INVASIVE CV LAB;  Service: Cardiovascular;  Laterality: N/A;   ROTATOR CUFF REPAIR Bilateral    wisdom teeth         Home Medications    Prior to Admission medications   Medication Sig Start Date End Date Taking? Authorizing Provider  cyclobenzaprine  (FLEXERIL ) 5 MG tablet Take 1 tablet (5 mg total) by mouth at bedtime as needed for muscle spasms. 03/03/24  Yes Loreda Myla SAUNDERS, NP  aspirin  EC 81 MG tablet Take 1 tablet (81 mg total) by mouth daily. Swallow whole. 07/13/20   Chandrasekhar, Stanly LABOR, MD  Cyanocobalamin (B-12) 1000 MCG SUBL Place 1,000 mcg under the tongue 2 (two) times a week.    [provider]  nitroGLYCERIN  (NITROSTAT ) 0.4 MG SL tablet Place 1 tablet (0.4 mg total) under the tongue every 5 (five) minutes as needed for chest pain. 03/08/22   Chandrasekhar, Mahesh A, MD  rosuvastatin  (CRESTOR ) 40 MG tablet TAKE 1 TABLET BY MOUTH DAILY 09/11/23   Chandrasekhar, Mahesh A, MD  VALTREX 1 g tablet as needed (cold sores). 08/21/23   [provider]    Family History Family History  Problem Relation Age of Onset   Diabetes Mother    Kidney  disease Mother    COPD Mother    Heart attack Father    Heart attack Sister    Heart attack Sister    Colon cancer Neg Hx    Colon polyps Neg Hx    Esophageal cancer Neg Hx    Rectal cancer Neg Hx    Stomach cancer Neg Hx     Social History Social History   Tobacco Use   Smoking status: Never   Smokeless tobacco: Never  Vaping Use   Vaping status: Never Used  Substance Use Topics   Alcohol  use: Yes    Comment: occasional beer   Drug use: Not Currently     Allergies   Jardiance  [empagliflozin ]   Review of  Systems Review of Systems  Musculoskeletal:  Positive for back pain.     Physical Exam Triage Vital Signs ED Triage Vitals  Encounter Vitals Group     BP 03/03/24 1030 115/71     Girls Systolic BP Percentile --      Girls Diastolic BP Percentile --      Boys Systolic BP Percentile --      Boys Diastolic BP Percentile --      Pulse Rate 03/03/24 1030 88     Resp 03/03/24 1030 17     Temp 03/03/24 1030 98.1 F (36.7 C)     Temp Source 03/03/24 1030 Oral     SpO2 03/03/24 1030 100 %     Weight --      Height --      Head Circumference --      Peak Flow --      Pain Score 03/03/24 1028 9     Pain Loc --      Pain Education --      Exclude from Growth Chart --    No data found.  Updated Vital Signs BP 115/71   Pulse 88   Temp 98.1 F (36.7 C) (Oral)   Resp 17   SpO2 100%   Visual Acuity Right Eye Distance:   Left Eye Distance:   Bilateral Distance:    Right Eye Near:   Left Eye Near:    Bilateral Near:     Physical Exam Vitals and nursing note reviewed.  Constitutional:      Appearance: Normal appearance.  HENT:     Head: Normocephalic and atraumatic.  Eyes:     Pupils: Pupils are equal, round, and reactive to light.  Cardiovascular:     Rate and Rhythm: Normal rate.  Pulmonary:     Effort: Pulmonary effort is normal.  Musculoskeletal:     Lumbar back: Spasms and tenderness present. No swelling, edema, deformity, signs of trauma, lacerations or bony tenderness. Normal range of motion. Positive right straight leg raise test and positive left straight leg raise test. No scoliosis.       Back:     Comments: Strength 5 out of 5 bilateral lower extremities  Skin:    General: Skin is warm and dry.  Neurological:     General: No focal deficit present.     Mental Status: He is alert and oriented to person, place, and time.  Psychiatric:        Mood and Affect: Mood normal.        Behavior: Behavior normal.      UC Treatments / Results  Labs (all labs  ordered are listed, but only abnormal results are displayed) Labs Reviewed - No data to display Basic Metabolic Panel (  BMET) Order: [id number]  Status: Final result     Next appt: 06/20/2024 at 10:00 AM in Cardiology [facility] DELENA Leavens, MD)     Dx: Heart failure with mildly reduced eje...   Test Result Released: Yes (seen)     Messages: Seen   3 Result Notes     1 Patient Communication        Component Ref Range & Units (hover) 1 yr ago (01/15/23) 1 yr ago (04/26/22) 1 yr ago (04/24/22) 1 yr ago (04/20/22) 3 yr ago (08/05/20)  Glucose 78 106 High  CM 105 High  103 High  89 R  BUN 14 15 R 20 17 12   Creatinine, Ser 0.97 1.15 R 1.31 High  1.10 1.04  eGFR 81  57 Low  70   BUN/Creatinine Ratio 14  15 15 12   Sodium 143 138 R 141 139 140  Potassium 4.9 3.7 R 4.5 5.5 High  4.3  Chloride 105 110 R 105 107 High  103  CO2 24 19 Low  R 27 27 24   Calcium  9.5 8.8 Low  R 8.9 9.2 9.2  Resulting Agency [facility]         Narrative Performed by: HOYT Performed at:  [address] [facility] [address] [place], [place]  [id number] Lab Director: Frankey Sas MD, Phone:  [phone]  Specimen Collected: 01/15/23 11:43 Last Resulted: 01/16/23 07:38    EKG   Radiology No results found.  Procedures Procedures (including critical care time)  Medications Ordered in UC Medications  ketorolac  (TORADOL ) 30 MG/ML injection 15 mg (has no administration in time range)    Initial Impression / Assessment and Plan / UC Course  I have reviewed the triage vital signs and the nursing notes.  Pertinent labs & imaging results that were available during my care of the patient were reviewed by me and considered in my medical decision making (see chart for details).     Reviewed exam and symptoms with patient.  No red flags.  Discussed low back strain.  Toradol  given in clinic.  Patient monitored for 10 minutes after injection with no reaction noted and  tolerated well.  No NSAIDs for 24 hours and he verbalized understanding.  Will do low-dose Flexeril  nightly as needed, side effect profile reviewed.  He may continue heat, over-the-counter Lidoderm  patches as well as rest.  Will avoid steroids at this time given history of heart failure.  Advised follow-up with PCP in 2 days for recheck.  ER precautions reviewed. Final Clinical Impressions(s) / UC Diagnoses   Final diagnoses:  Acute right-sided low back pain with bilateral sciatica     Discharge Instructions      You were given a Toradol  injection in clinic today. Do not take any over the counter NSAID's such as Advil, ibuprofen, Aleve , or naproxen  for 24 hours.  You may take tylenol  if needed.  You may take Flexeril  at night for your back pain as needed.  Please note this medication will make you drowsy.  Do not drink alcohol  or drive while on this medication.  Continue heat to the back and rest.  Please follow-up with your PCP in 2 days for recheck.  Please go to the ER if you develop any worsening symptoms.  Hope you feel better soon!      ED Prescriptions     Medication Sig Dispense Auth. Provider   cyclobenzaprine  (FLEXERIL ) 5 MG tablet Take 1 tablet (5 mg total) by mouth  at bedtime as needed for muscle spasms. 4 tablet Tashai Catino, Jodi R, NP      PDMP not reviewed this encounter.   Loreda Myla SAUNDERS, NP 03/03/24 1050

## 2024-03-03 NOTE — Discharge Instructions (Addendum)
 You were given a Toradol  injection in clinic today. Do not take any over the counter NSAID's such as Advil, ibuprofen, Aleve , or naproxen  for 24 hours.  You may take tylenol  if needed.  You may take Flexeril  at night for your back pain as needed.  Please note this medication will make you drowsy.  Do not drink alcohol  or drive while on this medication.  Continue heat to the back and rest.  Please follow-up with your PCP in 2 days for recheck.  Please go to the ER if you develop any worsening symptoms.  Hope you feel better soon!

## 2024-03-03 NOTE — ED Triage Notes (Signed)
 Pt was mowing the lawn and pulled a muscle on right lower back a week ago. Pt c/o aching in legs bilat when he has been laying down. Pt denies numbness or tingling. Pt denies loss of bowel or bladder

## 2024-03-04 DIAGNOSIS — M545 Low back pain, unspecified: Secondary | ICD-10-CM | POA: Diagnosis not present

## 2024-03-07 ENCOUNTER — Other Ambulatory Visit: Payer: Self-pay | Admitting: Internal Medicine

## 2024-03-25 ENCOUNTER — Encounter: Payer: Self-pay | Admitting: Internal Medicine

## 2024-04-08 DIAGNOSIS — M545 Low back pain, unspecified: Secondary | ICD-10-CM | POA: Diagnosis not present

## 2024-04-16 DIAGNOSIS — M545 Low back pain, unspecified: Secondary | ICD-10-CM | POA: Diagnosis not present

## 2024-04-23 DIAGNOSIS — L720 Epidermal cyst: Secondary | ICD-10-CM | POA: Diagnosis not present

## 2024-04-23 DIAGNOSIS — L570 Actinic keratosis: Secondary | ICD-10-CM | POA: Diagnosis not present

## 2024-04-23 DIAGNOSIS — Z85820 Personal history of malignant melanoma of skin: Secondary | ICD-10-CM | POA: Diagnosis not present

## 2024-04-23 DIAGNOSIS — L821 Other seborrheic keratosis: Secondary | ICD-10-CM | POA: Diagnosis not present

## 2024-04-23 DIAGNOSIS — D2261 Melanocytic nevi of right upper limb, including shoulder: Secondary | ICD-10-CM | POA: Diagnosis not present

## 2024-04-23 DIAGNOSIS — Z85828 Personal history of other malignant neoplasm of skin: Secondary | ICD-10-CM | POA: Diagnosis not present

## 2024-04-23 DIAGNOSIS — D2272 Melanocytic nevi of left lower limb, including hip: Secondary | ICD-10-CM | POA: Diagnosis not present

## 2024-04-23 DIAGNOSIS — L820 Inflamed seborrheic keratosis: Secondary | ICD-10-CM | POA: Diagnosis not present

## 2024-04-23 DIAGNOSIS — D2372 Other benign neoplasm of skin of left lower limb, including hip: Secondary | ICD-10-CM | POA: Diagnosis not present

## 2024-04-23 DIAGNOSIS — D2262 Melanocytic nevi of left upper limb, including shoulder: Secondary | ICD-10-CM | POA: Diagnosis not present

## 2024-04-25 DIAGNOSIS — M545 Low back pain, unspecified: Secondary | ICD-10-CM | POA: Diagnosis not present

## 2024-04-30 DIAGNOSIS — M545 Low back pain, unspecified: Secondary | ICD-10-CM | POA: Diagnosis not present

## 2024-06-20 ENCOUNTER — Ambulatory Visit: Attending: Internal Medicine | Admitting: Internal Medicine

## 2024-06-20 VITALS — BP 121/75 | HR 73 | Ht 74.0 in | Wt 177.0 lb

## 2024-06-20 DIAGNOSIS — I700 Atherosclerosis of aorta: Secondary | ICD-10-CM | POA: Diagnosis not present

## 2024-06-20 DIAGNOSIS — I25110 Atherosclerotic heart disease of native coronary artery with unstable angina pectoris: Secondary | ICD-10-CM | POA: Diagnosis not present

## 2024-06-20 DIAGNOSIS — I5020 Unspecified systolic (congestive) heart failure: Secondary | ICD-10-CM | POA: Diagnosis not present

## 2024-06-20 DIAGNOSIS — E782 Mixed hyperlipidemia: Secondary | ICD-10-CM | POA: Diagnosis not present

## 2024-06-20 NOTE — Progress Notes (Signed)
 " Cardiology Office Note:    Date:  06/20/2024   ID:  Jeffery Kirby, DOB 04-01-1946, MRN [id number]  PCP:  Jeffery Kirby., MD  [facility] Cardiologist: Jeffery Leavens MD [facility] Electrophysiologist:  None   CC: CAD f/u  History of Present Illness:    Jeffery Kirby is a 78 y.o. male with a HTN and HLD who presented for evaluation 07/13/20.  2022: Cardiac CT which showed multi-vessel disease; patient OYR:ojmhzob non obstructive disease save for with obstructive apical disease. With medical therapy, lightheaded on BB.   2023: started dietary changes for CAD secondary prevention.  Had worsening CP.  R Radial approach PCI of LAD.  He had improvement in angina but notable right arm pain.   Saw [facility]VS.  Saw APP in 05/03/22. 2024: Still having dizziness.  Completed cardiac rehab.  Worsening SOB: EF mildly decreased.  They are going to [place] in 2 months 2025: Going to [place] in October  Mr.  Jeffery Kirby is a 78 year old male with coronary artery disease and heart failure who presents with episodes of chest pain.  He has been experiencing episodes of chest pain since his last visit. The first episode occurred months ago, presenting as shooting pains in his back, which were severe but short-lived. The second episode was similar, occurring on the same side, and the third episode was central chest pain. These episodes were not associated with any strenuous activity and resolved spontaneously. He experiences intermittent fatigue.  He has a history of coronary artery disease and is status post prior PCI of proximal disease. He also has heart failure with reduced ejection fraction. He has experienced significant dizziness and orthostasis. He has not been on beta blockers or MRAs and has discontinued SGLT2 inhibitors in the past due to dizziness and rash.  He has a history of a bifascicular block without evidence of high-grade AV block, aortic atherosclerosis, and hyperlipidemia. He  is currently not taking any medications specifically for his heart condition, but he is on aspirin  and rosuvastatin  for cholesterol management.  He is active, including going up and down stairs, but experiences fatigue intermittently. He recently traveled to [place], [place], and [place], and was able to participate in activities during the trip.   Past Medical History:  Diagnosis Date   Angina pectoris    Cataract    Exertional chest pain    Hyperlipidemia    Hypertension    Shingles 2012   Tinnitus     Past Surgical History:  Procedure Laterality Date   COLONOSCOPY     CORONARY PRESSURE/FFR STUDY N/A 04/25/2022   Procedure: INTRAVASCULAR PRESSURE WIRE/FFR STUDY;  Surgeon: Jeffery Candyce RAMAN, MD;  Location: [facility] INVASIVE CV LAB;  Service: Cardiovascular;  Laterality: N/A;   CORONARY STENT INTERVENTION N/A 04/25/2022   Procedure: CORONARY STENT INTERVENTION;  Surgeon: Jeffery Candyce RAMAN, MD;  Location: [facility] INVASIVE CV LAB;  Service: Cardiovascular;  Laterality: N/A;   LEFT HEART CATH AND CORONARY ANGIOGRAPHY N/A 08/16/2020   Procedure: LEFT HEART CATH AND CORONARY ANGIOGRAPHY;  Surgeon: Jeffery Bruckner, MD;  Location: [facility] INVASIVE CV LAB;  Service: Cardiovascular;  Laterality: N/A;   LEFT HEART CATH AND CORONARY ANGIOGRAPHY N/A 04/25/2022   Procedure: LEFT HEART CATH AND CORONARY ANGIOGRAPHY;  Surgeon: Jeffery Candyce RAMAN, MD;  Location: [facility] INVASIVE CV LAB;  Service: Cardiovascular;  Laterality: N/A;   ROTATOR CUFF REPAIR Bilateral    wisdom teeth      Current Medications: Current Meds  Medication Sig  aspirin  EC 81 MG tablet Take 1 tablet (81 mg total) by mouth daily. Swallow whole.   Cyanocobalamin (B-12) 1000 MCG SUBL Place 1,000 mcg under the tongue 2 (two) times a week.   cyclobenzaprine  (FLEXERIL ) 5 MG tablet Take 1 tablet (5 mg total) by mouth at bedtime as needed for muscle spasms.   nitroGLYCERIN  (NITROSTAT ) 0.4 MG SL tablet Place 1 tablet (0.4 mg total) under the  tongue every 5 (five) minutes as needed for chest pain.   rosuvastatin  (CRESTOR ) 40 MG tablet TAKE 1 TABLET BY MOUTH DAILY   VALTREX 1 g tablet as needed (cold sores).     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[place]  history  Tobacco Use   Smoking status: Never   Smokeless tobacco: Never  Vaping Use   Vaping status: Never Used  Substance and Sexual Activity   Alcohol  use: Yes    Comment: occasional beer   Drug use: Not Currently   Sexual activity: Not on file  Other Topics Concern   Not on file  Social History Narrative   Right handed   Lives with wife    Retired   Social Drivers of Health   Tobacco Use: Low Risk (03/03/2024)   Patient History    Smoking Tobacco Use: Never    Smokeless Tobacco Use: Never    Passive Exposure: Not on file  Financial Resource Strain: Not on file  Food Insecurity: No Food Insecurity (04/25/2022)   Hunger Vital Sign    Worried About Running Out of Food in the Last Year: Never true    Ran Out of Food in the Last Year: Never true  Transportation Needs: No Transportation Needs (04/25/2022)   PRAPARE - Administrator, Civil Service (Medical): No    Lack of Transportation (Non-Medical): No  Physical Activity: Not on file  Stress: Not on file  Social Connections: Not on file  Depression (PHQ2-9): Low Risk (09/29/2022)   Depression (PHQ2-9)    PHQ-2 Score: 0  Alcohol  Screen: Not on file  Housing: Low Risk (04/25/2022)   Housing    Last Housing Risk Score: 0  Utilities: Not At Risk (04/25/2022)   AHC Utilities    Threatened with loss of utilities: No  Health Literacy: Not on file    Social: Went to [employer], married; retired; went to  [place] in 2022, went to [place] in 2023 [place] in 2024, then [place], and [place] in 2025  Family History: The patient's family history includes COPD in his mother; Diabetes in his mother; Heart attack in his father, sister, and sister; Kidney disease in his mother. There is no history of Colon cancer, Colon polyps, Esophageal cancer, Rectal cancer, or Stomach cancer. History of coronary artery disease notable for sisters and father. History of heart failure notable for no members. History of arrhythmia notable for no members. Sister died young at the age of 28 (passed in her sleep). Brother has ICD  ROS:   Please see the history of present illness.      EKGs/Labs/Other Studies Reviewed:    The following studies were reviewed today:  Cardiac Studies & Procedures   ______________________________________________________________________________________________ CARDIAC CATHETERIZATION  CARDIAC CATHETERIZATION 04/25/2022  Conclusion   Dist LAD lesion is 95% stenosed.  Unchanged from prior and too distal for intervention.   Ost Cx to Prox Cx lesion is 20% stenosed.   1st Diag lesion is 50% stenosed.   1st LPL lesion is 40% stenosed.   Prox LAD lesion is 50% stenosed.  RFR abnormal, 0.83 indicating significant flow reduction in this territory.   A drug-eluting stent was successfully placed using a SYNERGY XD 2.75X24, postdilated to 3.25 mm.   Post intervention, there is a 0% residual stenosis.   The left ventricular systolic function is normal.   LV end diastolic pressure is normal.   The left ventricular ejection fraction is 55-65% by visual estimate.   There is no aortic valve stenosis.  Continue dual antiplatelet therapy along with aggressive secondary prevention.  He had typical anginal symptoms with balloon inflations.  Should be able to gradually decrease antianginal therapy starting with ranolazine .  He does still have distal LAD disease which is not amenable to  PCI.  Findings Coronary Findings Diagnostic  Dominance: Co-dominant  Left Main Vessel is large. Vessel is angiographically normal.  Left Anterior Descending Vessel is large. Prox LAD lesion is 50% stenosed. The lesion is eccentric. The lesion is severely calcified. Pressure wire/FFR was performed on the lesion. RFR 0.83, positive for ischemia Dist LAD lesion is 95% stenosed.  First Diagonal Branch Vessel is small in size. 1st Diag lesion is 50% stenosed.  Second Diagonal Branch Vessel is small in size.  Third Diagonal Branch Vessel is small in size.  Ramus Intermedius Vessel is small. Vessel is angiographically normal.  Left Circumflex Vessel is large. Ost Cx to Prox Cx lesion is 20% stenosed.  First Obtuse Marginal Branch Vessel is small in size.  Second Obtuse Marginal Branch Vessel is moderate in size. The vessel exhibits minimal luminal irregularities.  Third Obtuse Marginal Branch Vessel is moderate in size. The vessel exhibits minimal luminal irregularities.  First Left Posterolateral Branch Vessel is large in size. 1st LPL lesion is 40% stenosed.  Second Left Posterolateral Branch Vessel is small in size.  Third Left Posterolateral Branch Vessel is small in size.  Right Coronary Artery Vessel is moderate in size. There is moderate diffuse disease throughout the vessel.  Right Posterior Descending Artery Vessel is small in size.  Intervention  Prox LAD lesion Stent CATH LAUNCHER 6FR EBU3.5 guide catheter was inserted. Lesion crossed with guidewire using a GUIDEWIRE PRESSURE X 175. Pre-stent angioplasty was performed using a BALLN WOLVERINE 2.75X10. A drug-eluting stent was successfully placed using a SYNERGY XD 2.75X24. Post-stent angioplasty was performed using a BALL SAPPHIRE NC24 3.25X12. Prowater used as a therapist, art.  Initially, a 3.0 x 15 Wolverine balloon was used to attempt to predilate.  This would not cross the tortuosity into the diseased  area of the LAD.  We then used a 2.5 semicompliant balloon to predilate.  The 3.0 Wolverine balloon still would not cross.  We then changed for a 2.75 x 12 Wolverine balloon which eventually did cross into the diseased area of the proximal LAD.  Multiple inflations were done.  A 2.75 x 24 Synergy drug-eluting stent was then deployed and subsequently postdilated with a 3.25 [place] balloon. Post-Intervention Lesion Assessment The intervention was successful. Pre-interventional TIMI flow is 3. Post-intervention TIMI flow is 3. No complications occurred at this lesion. Additional compression to the forearm performed with blood pressure cuff due to concern for hematoma. There is a 0% residual stenosis post  intervention.   CARDIAC CATHETERIZATION  CARDIAC CATHETERIZATION 08/16/2020  Conclusion Conclusions: 1. Multivessel coronary artery disease, as detailed below.  Most severe lesion is a 95% stenosis involving the apical LAD.  There are also 50% stenoses involving the proximal LAD and small D1 branch, as well as 40% lPL1 stenosis and moderate diffuse disease involving the RCA. 2. Low normal left ventricular systolic function (LVEF 50-55%) with mildly elevated filling pressure (LVEDP ~20 mmHg).  Recommendations: 1. Optimize medical therapy; 95% apical LAD stenosis is not well-suited for PCI given distal location and vessel size.  Will add metoprolol  tartrate 25 mg twice daily. 2. Aggressive secondary prevention of coronary artery disease.  Lonni Hanson, MD [facility]  Findings Coronary Findings Diagnostic  Dominance: Co-dominant  Left Main Vessel is large. Vessel is angiographically normal.  Left Anterior Descending Vessel is large. Prox LAD lesion is 50% stenosed. The lesion is eccentric. The lesion is severely calcified. Dist LAD lesion is 95% stenosed.  First Diagonal Branch Vessel is small in size. 1st Diag lesion is 50% stenosed.  Second Diagonal Branch Vessel is small in  size.  Third Diagonal Branch Vessel is small in size.  Ramus Intermedius Vessel is small. Vessel is angiographically normal.  Left Circumflex Vessel is large. Ost Cx to Prox Cx lesion is 20% stenosed.  First Obtuse Marginal Branch Vessel is small in size.  Second Obtuse Marginal Branch Vessel is moderate in size. The vessel exhibits minimal luminal irregularities.  Third Obtuse Marginal Branch Vessel is moderate in size. The vessel exhibits minimal luminal irregularities.  First Left Posterolateral Branch Vessel is large in size. 1st LPL lesion is 40% stenosed.  Second Left Posterolateral Branch Vessel is small in size.  Third Left Posterolateral Branch Vessel is small in size.  Right Coronary Artery Vessel is moderate in size. There is moderate diffuse disease throughout the vessel.  Right Posterior Descending Artery Vessel is small in size.  Intervention  No interventions have been documented.     ECHOCARDIOGRAM  ECHOCARDIOGRAM COMPLETE 11/30/2022  Narrative ECHOCARDIOGRAM REPORT    Patient Name:   Jeffery Kirby Date of Exam: 11/30/2022 Medical Rec #:  [id number]        Height:       74.0 in Accession #:    [id number]       Weight:       187.0 lb Date of Birth:  1946-03-25         BSA:          2.112 m Patient Age:    77 years         BP:           108/64 mmHg Patient Gender: M                HR:           66 bpm. Exam Location:  [address]  Procedure: 2D Echo, Cardiac Doppler, Color Doppler, 3D Echo and Strain Analysis  Indications:    R06.02 SOB; R60.0 Lower extremity edema  History:        Patient has no prior history of Echocardiogram examinations. Risk Factors:Hypertension and Dyslipidemia.  Sonographer:    Augustin Seals RDCS Referring Phys: [id number] [facility] A Kaleb Sek  IMPRESSIONS   1. Left ventricular ejection fraction, by estimation, is 45 to 50%. The left ventricle has mildly decreased function. The left ventricle  demonstrates global hypokinesis. Left ventricular diastolic parameters are consistent with Grade I diastolic dysfunction (impaired relaxation). 2. Right ventricular systolic  function is mildly reduced. The right ventricular size is normal. There is normal pulmonary artery systolic pressure. The estimated right ventricular systolic pressure is 27.8 mmHg. 3. The mitral valve is normal in structure. Trivial mitral valve regurgitation. No evidence of mitral stenosis. 4. The aortic valve is tricuspid. There is mild calcification of the aortic valve. Aortic valve regurgitation is mild. No aortic stenosis is present. 5. Aortic dilatation noted. There is mild dilatation of the ascending aorta, measuring 42 mm. 6. The inferior vena cava is normal in size with greater than 50% respiratory variability, suggesting right atrial pressure of 3 mmHg.  FINDINGS Left Ventricle: Left ventricular ejection fraction, by estimation, is 45 to 50%. The left ventricle has mildly decreased function. The left ventricle demonstrates global hypokinesis. The left ventricular internal cavity size was normal in size. There is no left ventricular hypertrophy. Left ventricular diastolic parameters are consistent with Grade I diastolic dysfunction (impaired relaxation).  Right Ventricle: The right ventricular size is normal. No increase in right ventricular wall thickness. Right ventricular systolic function is mildly reduced. There is normal pulmonary artery systolic pressure. The tricuspid regurgitant velocity is 2.49 m/s, and with an assumed right atrial pressure of 3 mmHg, the estimated right ventricular systolic pressure is 27.8 mmHg.  Left Atrium: Left atrial size was normal in size.  Right Atrium: Right atrial size was normal in size.  Pericardium: There is no evidence of pericardial effusion.  Mitral Valve: The mitral valve is normal in structure. Trivial mitral valve regurgitation. No evidence of mitral valve  stenosis.  Tricuspid Valve: The tricuspid valve is normal in structure. Tricuspid valve regurgitation is trivial.  Aortic Valve: The aortic valve is tricuspid. There is mild calcification of the aortic valve. Aortic valve regurgitation is mild. Aortic regurgitation PHT measures 445 msec. No aortic stenosis is present.  Pulmonic Valve: The pulmonic valve was normal in structure. Pulmonic valve regurgitation is not visualized.  Aorta: Aortic dilatation noted. There is mild dilatation of the ascending aorta, measuring 42 mm.  Venous: The inferior vena cava is normal in size with greater than 50% respiratory variability, suggesting right atrial pressure of 3 mmHg.  IAS/Shunts: No atrial level shunt detected by color flow Doppler.   LEFT VENTRICLE PLAX 2D LVIDd:         4.90 cm     Diastology LVIDs:         3.70 cm     LV e' medial:    6.20 cm/s LV PW:         1.10 cm     LV E/e' medial:  8.1 LV IVS:        0.90 cm     LV e' lateral:   6.85 cm/s LVOT diam:     2.50 cm     LV E/e' lateral: 7.3 LV SV:         82 LV SV Index:   39 LVOT Area:     4.91 cm  3D Volume EF: LV Volumes (MOD)           3D EF:        53 % LV vol d, MOD A2C: 73.5 ml LV EDV:       136 ml LV vol d, MOD A4C: 83.8 ml LV ESV:       64 ml LV vol s, MOD A2C: 36.1 ml LV SV:        72 ml LV vol s, MOD A4C: 41.6 ml LV SV MOD A2C:  37.4 ml LV SV MOD A4C:     83.8 ml LV SV MOD BP:      40.2 ml  RIGHT VENTRICLE RV Basal diam:  3.90 cm RV Mid diam:    3.00 cm RV S prime:     10.10 cm/s TAPSE (M-mode): 2.4 cm  LEFT ATRIUM             Index        RIGHT ATRIUM           Index LA diam:        3.70 cm 1.75 cm/m   RA Area:     18.10 cm LA Vol (A2C):   32.4 ml 15.34 ml/m  RA Volume:   60.10 ml  28.45 ml/m LA Vol (A4C):   35.1 ml 16.62 ml/m LA Biplane Vol: 36.2 ml 17.14 ml/m AORTIC VALVE LVOT Vmax:   72.90 cm/s LVOT Vmean:  47.600 cm/s LVOT VTI:    0.166 m AI PHT:      445 msec  AORTA Ao Root diam: 3.30  cm Ao Asc diam:  4.20 cm  MITRAL VALVE               TRICUSPID VALVE MV Area (PHT): 2.87 cm    TR Peak grad:   24.8 mmHg MV Decel Time: 264 msec    TR Vmax:        249.00 cm/s MV E velocity: 50.30 cm/s MV A velocity: 63.40 cm/s  SHUNTS MV E/A ratio:  0.79        Systemic VTI:  0.17 m Systemic Diam: 2.50 cm  Dalton McleanMD Electronically signed by Ezra Kanner Signature Date/Time: 11/30/2022/11:15:15 AM    Final    MONITORS  LONG TERM MONITOR (3-14 DAYS) 12/27/2022  Narrative   Patient had a minimum heart rate of 50 bpm, maximum heart rate of 180 bpm, and average heart rate of 73 bpm.   Predominant underlying rhythm was sinus rhythm.   Paroxysmal SVT, three episodes,  lasting 6 beats at longest with a max rate of 135 bpm at fastest.   Isolated PACs were rare (<1.0%).   Isolated PVCs were occasional (3.0%).   Triggered and diary events associated with sinus rhythm, PACs, or PVCs.  Asymptomatic SVT.   CT SCANS  CT CORONARY FRACTIONAL FLOW RESERVE DATA PREP 08/10/2020  Narrative EXAM: CT-FFR ANALYSIS  CLINICAL DATA:  Multi-vessel coronary disease  FINDINGS: CT-FFR analysis was performed on the original cardiac CT angiogram dataset. Diagrammatic representation of the CT-FFR analysis is provided in a separate PDF document in PACS. This dictation was created using the PDF document and an interactive 3D model of the results. 3D model is not available in the EMR/PACS. Normal FFR range is >0.80.  1. Left Main: No significant functional stenosis, CT-FFR 0.99.  2. LAD: CT-FFR at the proximal LAD, CT-FFR 0.80 at the mid LAD, and CT-FFR 0.70 at the distal vessel. Unable to process diagonal vessels. 3. LCX: No significant functional stenosis, CT-FFR 0.97. 4. RCA: CT-FFR 0.92 at the proximal RCA, CT-FFR 0.87 at the mid RCA, and CT-FFR 0.83 at the distal vessel.  IMPRESSION: 1. CT FFR analysis shows possible significant functional stenosis in the distal RCA, and the mid  LAD. There is significant functional stenosis in the distal LAD.  Jeffery Leavens MD   Electronically Signed By: Jeffery Leavens MD On: 08/11/2020 09:13   CT CORONARY MORPH W/CTA COR W/SCORE 08/10/2020  Addendum 08/10/2020 12:55 PM ADDENDUM REPORT: 08/10/2020 12:52  CLINICAL DATA:  78 Year-old White Male  EXAM: Cardiac/Coronary CTA  TECHNIQUE: The patient was scanned on a [employer].  FINDINGS: A 100 kV prospective scan was triggered in the descending thoracic aorta at 111 HU's. Axial non-contrast 3 mm slices were carried out through the heart. The data set was analyzed on a dedicated work station and scored using the Agatson method. Gantry rotation speed was 250 msecs and collimation was .6 mm. No beta blockade and 0.8 mg of sl NTG was given. The 3D data set was reconstructed in 5% intervals of the 67-82 % of the R-R cycle. Diastolic phases were analyzed on a dedicated work station using MPR, MIP and VRT modes. The patient received 100 cc of contrast.  Aorta: Upper limits of normal; ascending aortic size 39 mm, with ascending aortic height index 2.10 cm/m. Descending aortic atherosclerosis noted. No dissection.  Aortic Valve: Tri-leaflet. Multiple small annular calcifications noted. Mild valvular calcification.  Coronary Arteries:  Normal coronary origin.  Right dominance.  Coronary calcium  score of 1724. This was 90th percentile for age, sex, and race matched control.  RCA is a large dominant artery that gives rise to PDA and PLA. There is a 70-99% mixed plaque stenosis, with a subsequent 50-70% mixed plaque stenosis in the proximal vessel, there is a 50-70% mixed plaque stenosis in the mid vessel, there is a 70-90% calcified plaque stenosis in the distal vessel. In addition there are minimal non-obstructive calcified plaques scattered through the mid/distal vessel and its branches.  Left main is a large artery that gives rise to LAD and  LCX arteries. The ostia of the left main appear aneurysmal measuring 16 mm with mild calcification. This narrows to 5 mm by the distal vessel. There is 30% distal stenosis with a calcified plaque with napkin ring sign (high risk feature).  LAD is a large vessel that gives off two diagonal vessels. There is an ostial 50-70% calcified stenosis narrowing to a 70-99% mixed plaque stenosis in the proximal vessel with 50-70% calcified plaque stenosis in the mid vessel. There is a mild non obstructive (25-49%) calcified plaque in the ostium of the D1 vessel. There is a mild non obstructive (25-49%) calcified plaque in the ostium of the distal vessel.  LCX is a non-dominant artery that gives rise to four obtuse marginal vessels including a large OM4 branch that bifurcates. There is moderate (50-70%) calcified stenosis in the proximal vessel with multiple minimal non obstructive (1-24%) calcified plaques in the mid and distant vessel. There is a minimal non obstructive (1-24%) calcified plaque in the of the OM1 vessel. There is a minimal non obstructive (1-24%) soft plaque in the of the OM3 vessel. There is a mild non obstructive (25-49%) calcified plaque in the OM4 vessel prior to the bifurcation.  Other findings:  Normal pulmonary vein drainage into the left atrium.  Normal left atrial appendage without a thrombus.  Normal size of the pulmonary artery.  Extra-cardiac findings: See attached radiology report for non-cardiac structures.  IMPRESSION: 1. Coronary calcium  score of 1724. This was 90th percentile for age, sex, and race matched control.  2. Normal coronary origin with right dominance.  3. CAD-RADS 4 Severe stenosis. (70-99% or > 50% left main). There is left main, RCA, LAD, and LCX disease. Cardiac catheterization or CT FFR is recommended. Consider symptom-guided anti-ischemic pharmacotherapy as well as risk factor modification per guideline directed care.  4.  Left  main appears aneurysmal: Ostial diameter measured at 16 mm.  5.  Upper limits of normal ascending aortic size 39 mm, with ascending aortic height index 2.10 cm/m.  6.  Aortic atherosclerosis noted.  7.  Mild aortic valve calcification and mild annular calcification.   Electronically Signed By: Jeffery Leavens MD On: 08/10/2020 12:52  Narrative EXAM: OVER-READ INTERPRETATION  CT CHEST  The following report is an over-read performed by radiologist Dr. Marcey Moan of [facility] on 08/10/2020. This over-read does not include interpretation of cardiac or coronary anatomy or pathology. The coronary CTA interpretation by the cardiologist is attached.  COMPARISON:  None.  FINDINGS: Vascular: No significant vascular findings. Normal heart size. No pericardial effusion.  Mediastinum/Nodes: Visualized mediastinum and hilar regions demonstrate calcified right hilar and subcarinal lymph nodes consistent with prior granulomatous disease.  Lungs/Pleura: Visualized lungs show no evidence of pulmonary edema, consolidation, pneumothorax, nodule or pleural fluid.  Upper Abdomen: No acute abnormality.  Musculoskeletal: No chest wall mass or suspicious bone lesions identified.  IMPRESSION: Calcified right hilar and subcarinal lymph nodes consistent with prior granulomatous disease.  Electronically Signed: By: Marcey Moan M.D. On: 08/10/2020 11:17     ______________________________________________________________________________________________       Recent Labs: No results found for requested labs within last 365 days.  Recent Lipid Panel    Component Value Date/Time   CHOL 116 11/23/2020 0811   TRIG 73 11/23/2020 0811   HDL 52 11/23/2020 0811   CHOLHDL 2.2 11/23/2020 0811   LDLCALC 49 11/23/2020 0811    Physical Exam:    VS:  BP 121/75 (BP Location: Right Arm)   Pulse 73   Ht 6' 2 (1.88 m)   Wt 177 lb (80.3 kg)   SpO2 97%   BMI 22.73  kg/m     Wt Readings from Last 3 Encounters:  06/20/24 177 lb (80.3 kg)  01/14/24 180 lb 9.6 oz (81.9 kg)  03/19/23 182 lb 9.6 oz (82.8 kg)    Gen: No distress   Neck: No JVD Cardiac: No Rubs or Gallops no murmur  Respiratory: Clear to auscultation bilaterally, normal effort, normal  respiratory rate GI: Soft, nontender, non-distended  MS: Noedema;  moves all extremities Integument: Skin feels warm Neuro:  At time of evaluation, alert and oriented to person/place/time/situation Psych: Normal affect, patient feels well   ASSESSMENT/PLAN:    Coronary artery disease with microvascular involvement, post-PCI Intermittent chest pain with episodes of shooting pain in the back and center of the chest, occurring without strenuous activity. Previous medications including beta blockers (OH), calcium  channel blockers (OH), SGLT2 inhibitors (rash), and ranolazine  (dizziness) have been poorly tolerated. Current symptoms are mild, and there is hesitancy to pursue further testing or treatment due to limited therapeutic options for coronary microvascular disease. Discussed potential future therapies and the possibility of heart catheterization if new blockage is suspected. - Continue current management without new medications. - Will consider PET myocardial perfusion imaging if chest pain becomes persistent or severe. - Will discuss potential future therapies for coronary microvascular disease.  Heart failure with mildly reduced ejection fraction Mildly reduced ejection fraction with significant dizziness and orthostasis. Previous medications have exacerbated symptoms. Current management is conservative due to intolerance to multiple heart failure medications. - Continue current management without new heart failure medications.  Bifascicular block Without evidence of high-grade AV block. No current symptoms or changes in management required.  Aortic atherosclerosis No acute changes or interventions  required.  Hyperlipidemia Managed with rosuvastatin . Cholesterol levels are well-controlled. - Continue rosuvastatin  therapy.  Longitudinal care: The  evaluation and management services provided today reflect the complexity inherent in caring for this patient, including the ongoing longitudinal relationship and management of multiple chronic conditions and/or the need for care coordination. The visit required a comprehensive assessment and management plan tailored to the patient's unique needs Time was spent addressing not only the acute concerns but also the broader context of the patient's health, including preventive care, chronic disease management, and care coordination as appropriate.  Complex longitudinal is necessary for conditions including: CAD management with medical therapy intolerance; s/p PCI but with small vessel residual disease.   Jeffery Leavens, MD FASE [facility] Cardiologist [facility]  [address] [place], [place] [address] [phone]  9:56 AM  "

## 2024-06-20 NOTE — Patient Instructions (Signed)
 Medication Instructions:  Your physician recommends that you continue on your current medications as directed. Please refer to the Current Medication list given to you today.  *If you need a refill on your cardiac medications before your next appointment, please call your pharmacy*  Lab Work: NONE  If you have labs (blood work) drawn today and your tests are completely normal, you will receive your results only by: MyChart Message (if you have MyChart) OR A paper copy in the mail If you have any lab test that is abnormal or we need to change your treatment, we will call you to review the results.  Testing/Procedures: NONE  Follow-Up: At [facility], you and your health needs are our priority.  As part of our continuing mission to provide you with exceptional heart care, our providers are all part of one team.  This team includes your primary Cardiologist (physician) and Advanced Practice Providers or APPs (Physician Assistants and Nurse Practitioners) who all work together to provide you with the care you need, when you need it.  Your next appointment:   1 year(s)  Provider:   Stanly DELENA Leavens, MD
